# Patient Record
Sex: Female | Born: 1980 | Race: Black or African American | Hispanic: No | Marital: Single | Smoking: Never smoker
Health system: Southern US, Community
[De-identification: ages and names within clinical notes are randomized; demographics above are authoritative.]

## PROBLEM LIST (undated history)

## (undated) DIAGNOSIS — J984 Other disorders of lung: Secondary | ICD-10-CM

## (undated) DIAGNOSIS — J189 Pneumonia, unspecified organism: Secondary | ICD-10-CM

## (undated) DIAGNOSIS — I2699 Other pulmonary embolism without acute cor pulmonale: Secondary | ICD-10-CM

## (undated) DIAGNOSIS — J45909 Unspecified asthma, uncomplicated: Secondary | ICD-10-CM

## (undated) HISTORY — PX: APPENDECTOMY: SHX54

## (undated) HISTORY — PX: BREAST SURGERY: SHX581

---

## 2006-03-10 ENCOUNTER — Emergency Department (HOSPITAL_COMMUNITY): Admission: EM | Admit: 2006-03-10 | Discharge: 2006-03-10 | Payer: Self-pay | Admitting: Family Medicine

## 2006-08-02 ENCOUNTER — Ambulatory Visit (HOSPITAL_COMMUNITY): Admission: RE | Admit: 2006-08-02 | Discharge: 2006-08-02 | Payer: Self-pay | Admitting: Obstetrics & Gynecology

## 2009-10-09 ENCOUNTER — Other Ambulatory Visit
Admission: RE | Admit: 2009-10-09 | Discharge: 2009-10-09 | Payer: Self-pay | Source: Home / Self Care | Admitting: Obstetrics and Gynecology

## 2012-07-03 ENCOUNTER — Encounter (HOSPITAL_COMMUNITY): Payer: Self-pay | Admitting: *Deleted

## 2012-07-03 ENCOUNTER — Emergency Department (HOSPITAL_COMMUNITY): Payer: Managed Care, Other (non HMO)

## 2012-07-03 ENCOUNTER — Emergency Department (HOSPITAL_COMMUNITY)
Admission: EM | Admit: 2012-07-03 | Discharge: 2012-07-03 | Disposition: A | Discharge status: Home / Self Care | Payer: Managed Care, Other (non HMO) | Attending: Emergency Medicine | Admitting: Emergency Medicine

## 2012-07-03 DIAGNOSIS — J45901 Unspecified asthma with (acute) exacerbation: Secondary | ICD-10-CM | POA: Insufficient documentation

## 2012-07-03 DIAGNOSIS — R0982 Postnasal drip: Secondary | ICD-10-CM | POA: Insufficient documentation

## 2012-07-03 DIAGNOSIS — R0602 Shortness of breath: Secondary | ICD-10-CM | POA: Insufficient documentation

## 2012-07-03 HISTORY — DX: Unspecified asthma, uncomplicated: J45.909

## 2012-07-03 MED ORDER — ALBUTEROL SULFATE (5 MG/ML) 0.5% IN NEBU
2.5000 mg | INHALATION_SOLUTION | Freq: Once | RESPIRATORY_TRACT | Status: AC
  Administered 2012-07-03: 2.5 mg via RESPIRATORY_TRACT
  Filled 2012-07-03 (×2): qty 0.5

## 2012-07-03 MED ORDER — ALBUTEROL SULFATE (5 MG/ML) 0.5% IN NEBU
2.5000 mg | INHALATION_SOLUTION | Freq: Once | RESPIRATORY_TRACT | Status: AC
  Administered 2012-07-03: 2.5 mg via RESPIRATORY_TRACT
  Filled 2012-07-03: qty 0.5

## 2012-07-03 MED ORDER — PREDNISONE 20 MG PO TABS
60.0000 mg | ORAL_TABLET | Freq: Once | ORAL | Status: AC
  Administered 2012-07-03: 60 mg via ORAL
  Filled 2012-07-03: qty 3

## 2012-07-03 MED ORDER — PREDNISONE 50 MG PO TABS: 50.0000 mg | ORAL_TABLET | Freq: Every day | ORAL | Status: DC

## 2012-07-03 MED ORDER — IPRATROPIUM BROMIDE 0.02 % IN SOLN
0.5000 mg | Freq: Once | RESPIRATORY_TRACT | Status: AC
  Administered 2012-07-03: 0.5 mg via RESPIRATORY_TRACT
  Filled 2012-07-03: qty 2.5

## 2012-07-03 MED ORDER — ALBUTEROL SULFATE HFA 108 (90 BASE) MCG/ACT IN AERS: 1.0000 | INHALATION_SPRAY | Freq: Four times a day (QID) | RESPIRATORY_TRACT | Status: DC | PRN

## 2012-07-03 NOTE — ED Provider Notes (Addendum)
History     CSN: [id number]  Arrival date & time 07/03/12  1654   First MD Initiated Contact with Patient 07/03/12 1713      Chief Complaint  Patient presents with  . Cough  . Shortness of Breath    (Consider location/radiation/quality/duration/timing/severity/associated sxs/prior treatment) HPI Comments: Pt comes in with cc of dib. Pt has hx of asthma, well managed. States that she went out to a party yday, exposed to some dog, and that likely triggered her asthma. She has used several treatments today, with minimal relief. She has a productive phlegm, yellow, and some teary eyes and post nasal drip. No hx of DVT, PE and no risk factors for the same. No chest pains.  Patient is a 32 y.o. female presenting with cough and shortness of breath. The history is provided by the patient.  Cough Associated symptoms: shortness of breath and wheezing   Associated symptoms: no chest pain and no headaches   Shortness of Breath Associated symptoms: cough and wheezing   Associated symptoms: no abdominal pain, no chest pain, no headaches, no neck pain and no vomiting     Past Medical History  Diagnosis Date  . Asthma     Past Surgical History  Procedure Laterality Date  . Appendectomy    . Breast surgery  breast reduction    No family history on file.  History  Substance Use Topics  . Smoking status: Never Smoker   . Smokeless tobacco: Never Used  . Alcohol Use: No     Comment: occasionally    OB History   Grav Para Term Preterm Abortions TAB SAB Ect Mult Living                  Review of Systems  Constitutional: Negative for activity change.  HENT: Negative for neck pain.   Respiratory: Positive for cough, shortness of breath and wheezing.   Cardiovascular: Negative for chest pain.  Gastrointestinal: Negative for nausea, vomiting and abdominal pain.  Genitourinary: Negative for dysuria.  Neurological: Negative for headaches.    Allergies  Review of patient's  allergies indicates no known allergies.  Home Medications   Current Outpatient Rx  Name  Route  Sig  Dispense  Refill  . albuterol (PROVENTIL HFA;VENTOLIN HFA) 108 (90 BASE) MCG/ACT inhaler   Inhalation   Inhale 2 puffs into the lungs every 6 (six) hours as needed for wheezing.           BP 116/61  Pulse 100  Temp(Src) 99.8 F (37.7 C)  Resp 22  SpO2 98%  LMP 06/25/2012  Physical Exam  Nursing note and vitals reviewed. Constitutional: She is oriented to person, place, and time. She appears well-developed.  HENT:  Head: Normocephalic and atraumatic.  Eyes: Conjunctivae and EOM are normal. Pupils are equal, round, and reactive to light.  Neck: Normal range of motion. Neck supple.  Cardiovascular: Normal rate, regular rhythm and normal heart sounds.   Pulmonary/Chest: Effort normal. No respiratory distress. She has wheezes.  Left worse than right  Abdominal: Soft. Bowel sounds are normal. She exhibits no distension. There is no tenderness. There is no rebound and no guarding.  Neurological: She is alert and oriented to person, place, and time.  Skin: Skin is warm and dry.    ED Course  Procedures (including critical care time)  Labs Reviewed - No data to display No results found.   No diagnosis found.    MDM  Pt comes in with cc of  cough, wheezing and dib. Pt has mild wheezing, right worse than left. We will get breathing tx and CXR. Will reassess.   Derwood Kaplan, MD 07/03/12 1748  7:48 PM Improved exam, feels better. Will d.c  Derwood Kaplan, MD 07/03/12 1948

## 2012-07-03 NOTE — ED Notes (Signed)
Pt states yesterday she went to a friend's house who has a dog.  Pt states her eyes began to water and she developed SOB while at her friend's house.  Pt states this morning she developed a cough productive of green sputum.  End expiratory wheezing heard in right upper lobe.  Pt denies N/V/D and ever.  Pt has hx of asthma and used her albuterol inhaler at home with no relief.

## 2012-07-03 NOTE — ED Notes (Signed)
Patient is alert and oriented x3.  She was given DC instructions and follow up visit instructions.  Patient gave verbal understanding. She was DC ambulatory under her own power to home.  V/S stable.  He was not showing any signs of distress on DC 

## 2012-10-13 ENCOUNTER — Encounter (HOSPITAL_COMMUNITY): Payer: Self-pay | Admitting: Emergency Medicine

## 2012-10-13 ENCOUNTER — Emergency Department (INDEPENDENT_AMBULATORY_CARE_PROVIDER_SITE_OTHER)
Admission: EM | Admit: 2012-10-13 | Discharge: 2012-10-13 | Disposition: A | Discharge status: Home / Self Care | Payer: Self-pay | Source: Home / Self Care

## 2012-10-13 DIAGNOSIS — M67432 Ganglion, left wrist: Secondary | ICD-10-CM

## 2012-10-13 DIAGNOSIS — M6740 Ganglion, unspecified site: Secondary | ICD-10-CM

## 2012-10-13 NOTE — ED Provider Notes (Signed)
Medical screening examination/treatment/procedure(s) were performed by a resident physician or non-physician practitioner and as the supervising physician I was immediately available for consultation/collaboration.  Mat Stuard, MD   Pharaoh Pio S Ivadell Gaul, MD 10/13/12 1714 

## 2012-10-13 NOTE — ED Notes (Addendum)
Pt c/o knot on left wrist onset 3 days and pain onset 1 year Hx of carpal tunnel syndrome... Dx by PCP C/o a constant pain that radiates up to left arm... Numbness associated  Denies: inj/trauma... Taking advil/tyle w/some relief.  Alert w/no signs of acute distress.

## 2012-10-13 NOTE — ED Provider Notes (Signed)
CSN: [id number]     Arrival date & time 10/13/12  1046 History   First MD Initiated Contact with Patient 10/13/12 1150     Chief Complaint  Patient presents with  . Cyst   (Consider location/radiation/quality/duration/timing/severity/associated sxs/prior Treatment) HPI Comments: 32 year old female complaining of pain in the left volar wrist for over 15 years. She notes that there is a cystic lesion in the volar aspect of the wrist that becomes larger then smaller over this period of time. She has mild to moderate discomfort along the flexor surface of the forearm. She states she has seen several doctors but no one has diagnosed her problem.   Past Medical History  Diagnosis Date  . Asthma    Past Surgical History  Procedure Laterality Date  . Appendectomy    . Breast surgery  breast reduction   No family history on file. History  Substance Use Topics  . Smoking status: Never Smoker   . Smokeless tobacco: Never Used  . Alcohol Use: No     Comment: occasionally   OB History   Grav Para Term Preterm Abortions TAB SAB Ect Mult Living                 Review of Systems  All other systems reviewed and are negative.    Allergies  Review of patient's allergies indicates no known allergies.  Home Medications   Current Outpatient Rx  Name  Route  Sig  Dispense  Refill  . albuterol (PROVENTIL HFA;VENTOLIN HFA) 108 (90 BASE) MCG/ACT inhaler   Inhalation   Inhale 2 puffs into the lungs every 6 (six) hours as needed for wheezing.         Marland Kitchen albuterol (PROVENTIL HFA;VENTOLIN HFA) 108 (90 BASE) MCG/ACT inhaler   Inhalation   Inhale 1-2 puffs into the lungs every 6 (six) hours as needed for wheezing.   1 Inhaler   0   . predniSONE (DELTASONE) 50 MG tablet   Oral   Take 1 tablet (50 mg total) by mouth daily.   5 tablet   0    BP 90/60  Pulse 102  Temp(Src) 99.3 F (37.4 C) (Oral)  Resp 16  SpO2 99%  LMP 09/22/2012 Physical Exam  Constitutional: She is oriented to  person, place, and time. She appears well-developed and well-nourished. No distress.  Musculoskeletal:  2 cm diameter cystic lesion to the volar aspect of the left wrist. Mildly tender. No other surrounding lesions. No edema, discoloration or deformity. No evidence of trauma. Distal neurovascular motor sensory is intact.  Neurological: She is alert and oriented to person, place, and time. She exhibits normal muscle tone.  Skin: Skin is warm.  Psychiatric: She has a normal mood and affect.    ED Course  Procedures (including critical care time) Labs Review Labs Reviewed - No data to display Imaging Review No results found.  MDM   1. Ganglion cyst of wrist, left       Patient has a ganglionic cyst which is producing discomfort in the wrist and volar forearm. She is recommended to followup with the on-call hand surgeon as above. She are has interposition a wrist splint.  Hayden Rasmussen, NP 10/13/12 1220

## 2014-07-23 ENCOUNTER — Emergency Department (HOSPITAL_COMMUNITY)
Admission: EM | Admit: 2014-07-23 | Discharge: 2014-07-24 | Disposition: A | Discharge status: Home / Self Care | Payer: Managed Care, Other (non HMO) | Attending: Emergency Medicine | Admitting: Emergency Medicine

## 2014-07-23 ENCOUNTER — Encounter (HOSPITAL_COMMUNITY): Payer: Self-pay

## 2014-07-23 DIAGNOSIS — Z79899 Other long term (current) drug therapy: Secondary | ICD-10-CM | POA: Insufficient documentation

## 2014-07-23 DIAGNOSIS — Z7952 Long term (current) use of systemic steroids: Secondary | ICD-10-CM | POA: Insufficient documentation

## 2014-07-23 DIAGNOSIS — L139 Bullous disorder, unspecified: Secondary | ICD-10-CM | POA: Insufficient documentation

## 2014-07-23 DIAGNOSIS — J45909 Unspecified asthma, uncomplicated: Secondary | ICD-10-CM | POA: Diagnosis not present

## 2014-07-23 DIAGNOSIS — Z4800 Encounter for change or removal of nonsurgical wound dressing: Secondary | ICD-10-CM | POA: Diagnosis present

## 2014-07-23 NOTE — ED Notes (Signed)
Pt presents with c/o wound check. Pt reports she has had a wound on the bottom left of her left foot since approx first of May that is still present. Pt also c/o wound on the right side of her lower right leg.

## 2014-07-23 NOTE — ED Provider Notes (Signed)
CSN: [id number]     Arrival date & time 07/23/14  2157 History  This chart was scribed for non-physician provider Charlann Lange, PA-C, working with Lacretia Leigh, MD by Irene Pap, ED Scribe. This patient was seen in room [place] and patient care was started at 11:16 PM.   Chief Complaint  Patient presents with  . Wound Check   The history is provided by the patient. No language interpreter was used.  HPI Comments: Crystal Gallagher is a 34 y.o. female who presents to the Emergency Department requesting a wound check. She states that she had a rash that spread all over her body that has since relieved slightly. She states that she has a rash area on her right ankle. She states that it started in May, began to relieve and started to come back. She states that it is continuing to spread and is unable to wait for the dermatologist due to itching and pain. She reports having similar wounds on the medial surface and plantar surface of her right foot. She states that it started as a blister and now feels like "raw meat." She states that the pain is localized. She reports using neosporin on the areas to no relief. She denies drainage. She denies history of diabetes or any other significant medical problems or allergies to medication. She denies fever, chills, nausea or vomiting.   Past Medical History  Diagnosis Date  . Asthma    Past Surgical History  Procedure Laterality Date  . Appendectomy    . Breast surgery  breast reduction   No family history on file. History  Substance Use Topics  . Smoking status: Never Smoker   . Smokeless tobacco: Never Used  . Alcohol Use: No     Comment: occasionally   OB History    No data available     Review of Systems  Constitutional: Negative for fever and chills.  Gastrointestinal: Negative for nausea and vomiting.  Skin: Positive for rash and wound.   Allergies  Review of patient's allergies indicates no known allergies.  Home Medications    Prior to Admission medications   Medication Sig Start Date End Date Taking? Authorizing Provider  albuterol (PROVENTIL HFA;VENTOLIN HFA) 108 (90 BASE) MCG/ACT inhaler Inhale 2 puffs into the lungs every 6 (six) hours as needed for wheezing.    Historical Provider, MD  albuterol (PROVENTIL HFA;VENTOLIN HFA) 108 (90 BASE) MCG/ACT inhaler Inhale 1-2 puffs into the lungs every 6 (six) hours as needed for wheezing. 07/03/12   Varney Biles, MD  predniSONE (DELTASONE) 50 MG tablet Take 1 tablet (50 mg total) by mouth daily. 07/03/12   Ankit Nanavati, MD   BP 130/68 mmHg  Pulse 106  Temp(Src) 98.6 F (37 C) (Oral)  Resp 16  SpO2 98%  LMP 06/29/2014 (Approximate)   Physical Exam  Constitutional: She is oriented to person, place, and time. She appears well-developed and well-nourished. No distress.  HENT:  Head: Normocephalic and atraumatic.  Mouth/Throat: Oropharynx is clear and moist.  Eyes: Conjunctivae and EOM are normal.  Neck: Normal range of motion. Neck supple.  Cardiovascular: Normal rate, regular rhythm and normal heart sounds.   Pulmonary/Chest: Effort normal and breath sounds normal. No respiratory distress.  Musculoskeletal: Normal range of motion. She exhibits no edema.  Neurological: She is alert and oriented to person, place, and time. No sensory deficit.  Skin: Skin is warm and dry.  Large circular ulcerations bilateral ankle and feet; hyperpigmentation; no malodor, no drainage  Psychiatric:  She has a normal mood and affect. Her behavior is normal.  Nursing note and vitals reviewed.   ED Course  Procedures (including critical care time) DIAGNOSTIC STUDIES: Oxygen Saturation is 98% on room air, normal by my interpretation.    COORDINATION OF CARE: 11:22 PM-Discussed treatment plan which includes discussion with attending provider with pt at bedside and pt agreed to plan.   Labs Review Labs Reviewed - No data to display  Imaging Review No results found.   EKG  Interpretation None      MDM   Final diagnoses:  None    1. Bullous dermatitis  DDx: psoriasis vs bullous pemphigoid vs bullous impetigo.Patient is well appearing. No fever. Symptoms longstanding, though worsening. Doubt impetigo. Biopsy will be needed to definitively diagnose - will refer to dermatology. Oral steroids provided in taper dose.  I personally performed the services described in this documentation, which was scribed in my presence. The recorded information has been reviewed and is accurate.     Charlann Lange, PA-C 07/24/14 0448  Linton Flemings, MD 07/24/14 [phone]

## 2014-07-24 MED ORDER — PREDNISONE 10 MG PO TABS: ORAL_TABLET | ORAL | Status: DC

## 2014-07-24 NOTE — Discharge Instructions (Signed)
FOLLOW UP WITH DERMATOLOGY FOR FURTHER EVALUATION AND MANAGEMENT OF PROGRESSIVE RASH.

## 2014-08-14 ENCOUNTER — Other Ambulatory Visit (HOSPITAL_COMMUNITY)
Admission: RE | Admit: 2014-08-14 | Discharge: 2014-08-14 | Disposition: A | Discharge status: Home / Self Care | Payer: Managed Care, Other (non HMO) | Source: Ambulatory Visit | Attending: Obstetrics and Gynecology | Admitting: Obstetrics and Gynecology

## 2014-08-14 ENCOUNTER — Other Ambulatory Visit: Payer: Self-pay | Admitting: Obstetrics and Gynecology

## 2014-08-14 DIAGNOSIS — R87810 Cervical high risk human papillomavirus (HPV) DNA test positive: Secondary | ICD-10-CM | POA: Diagnosis present

## 2014-08-14 DIAGNOSIS — Z113 Encounter for screening for infections with a predominantly sexual mode of transmission: Secondary | ICD-10-CM | POA: Diagnosis present

## 2014-08-14 DIAGNOSIS — Z01419 Encounter for gynecological examination (general) (routine) without abnormal findings: Secondary | ICD-10-CM | POA: Diagnosis not present

## 2014-08-14 DIAGNOSIS — Z1151 Encounter for screening for human papillomavirus (HPV): Secondary | ICD-10-CM | POA: Diagnosis present

## 2014-08-15 LAB — CYTOLOGY - PAP

## 2016-05-04 ENCOUNTER — Other Ambulatory Visit: Payer: Self-pay | Admitting: Surgical Oncology

## 2016-05-04 DIAGNOSIS — J4520 Mild intermittent asthma, uncomplicated: Secondary | ICD-10-CM

## 2016-10-21 ENCOUNTER — Emergency Department (HOSPITAL_COMMUNITY)
Admission: EM | Admit: 2016-10-21 | Discharge: 2016-10-21 | Disposition: A | Discharge status: Home / Self Care | Payer: 59 | Attending: Emergency Medicine | Admitting: Emergency Medicine

## 2016-10-21 ENCOUNTER — Encounter (HOSPITAL_COMMUNITY): Payer: Self-pay | Admitting: Emergency Medicine

## 2016-10-21 DIAGNOSIS — R05 Cough: Secondary | ICD-10-CM | POA: Diagnosis present

## 2016-10-21 DIAGNOSIS — R0602 Shortness of breath: Secondary | ICD-10-CM | POA: Diagnosis not present

## 2016-10-21 DIAGNOSIS — Z5321 Procedure and treatment not carried out due to patient leaving prior to being seen by health care provider: Secondary | ICD-10-CM | POA: Diagnosis not present

## 2016-10-21 NOTE — ED Triage Notes (Signed)
Pt states for the past few days she has had a cough and some shortness of breath on exertion  Pt has hx of asthma and does not have an inhaler

## 2016-10-21 NOTE — ED Notes (Signed)
Pt told registration clerk she was leaving

## 2016-10-30 ENCOUNTER — Encounter (HOSPITAL_BASED_OUTPATIENT_CLINIC_OR_DEPARTMENT_OTHER): Payer: Self-pay

## 2016-10-30 ENCOUNTER — Emergency Department (HOSPITAL_BASED_OUTPATIENT_CLINIC_OR_DEPARTMENT_OTHER): Payer: 59

## 2016-10-30 ENCOUNTER — Emergency Department (HOSPITAL_BASED_OUTPATIENT_CLINIC_OR_DEPARTMENT_OTHER)
Admission: EM | Admit: 2016-10-30 | Discharge: 2016-10-30 | Disposition: A | Discharge status: Home / Self Care | Payer: 59 | Attending: Emergency Medicine | Admitting: Emergency Medicine

## 2016-10-30 DIAGNOSIS — Z79899 Other long term (current) drug therapy: Secondary | ICD-10-CM | POA: Diagnosis not present

## 2016-10-30 DIAGNOSIS — R062 Wheezing: Secondary | ICD-10-CM | POA: Diagnosis present

## 2016-10-30 DIAGNOSIS — J45909 Unspecified asthma, uncomplicated: Secondary | ICD-10-CM | POA: Diagnosis not present

## 2016-10-30 MED ORDER — ALBUTEROL SULFATE HFA 108 (90 BASE) MCG/ACT IN AERS: 1.0000 | INHALATION_SPRAY | Freq: Four times a day (QID) | RESPIRATORY_TRACT | 0 refills | Status: AC | PRN

## 2016-10-30 MED ORDER — BENZONATATE 100 MG PO CAPS: 100.0000 mg | ORAL_CAPSULE | Freq: Three times a day (TID) | ORAL | 0 refills | Status: DC
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
## 2016-11-10 ENCOUNTER — Emergency Department (HOSPITAL_COMMUNITY): Payer: 59

## 2016-11-10 ENCOUNTER — Encounter (HOSPITAL_COMMUNITY): Payer: Self-pay | Admitting: Emergency Medicine

## 2016-11-10 ENCOUNTER — Emergency Department (HOSPITAL_COMMUNITY)
Admission: EM | Admit: 2016-11-10 | Discharge: 2016-11-10 | Disposition: A | Discharge status: Home / Self Care | Payer: 59 | Attending: Emergency Medicine | Admitting: Emergency Medicine

## 2016-11-10 DIAGNOSIS — R05 Cough: Secondary | ICD-10-CM | POA: Diagnosis present

## 2016-11-10 DIAGNOSIS — J189 Pneumonia, unspecified organism: Secondary | ICD-10-CM

## 2016-11-10 DIAGNOSIS — J181 Lobar pneumonia, unspecified organism: Secondary | ICD-10-CM | POA: Diagnosis not present

## 2016-11-10 DIAGNOSIS — J45909 Unspecified asthma, uncomplicated: Secondary | ICD-10-CM | POA: Diagnosis not present

## 2016-11-10 LAB — CBC WITH DIFFERENTIAL/PLATELET
Basophils Absolute: 0 10*3/uL (ref 0.0–0.1)
Basophils Relative: 1 %
EOS PCT: 8 %
Eosinophils Absolute: 0.6 10*3/uL (ref 0.0–0.7)
HEMATOCRIT: 41.3 % (ref 36.0–46.0)
Hemoglobin: 13.4 g/dL (ref 12.0–15.0)
LYMPHS ABS: 1.6 10*3/uL (ref 0.7–4.0)
LYMPHS PCT: 21 %
MCH: 28.5 pg (ref 26.0–34.0)
MCHC: 32.4 g/dL (ref 30.0–36.0)
MCV: 87.7 fL (ref 78.0–100.0)
MONO ABS: 0.6 10*3/uL (ref 0.1–1.0)
MONOS PCT: 8 %
NEUTROS ABS: 4.6 10*3/uL (ref 1.7–7.7)
Neutrophils Relative %: 62 %
PLATELETS: 368 10*3/uL (ref 150–400)
RBC: 4.71 MIL/uL (ref 3.87–5.11)
RDW: 14 % (ref 11.5–15.5)
WBC: 7.4 10*3/uL (ref 4.0–10.5)

## 2016-11-10 LAB — COMPREHENSIVE METABOLIC PANEL
ALT: 32 U/L (ref 14–54)
ANION GAP: 9 (ref 5–15)
AST: 27 U/L (ref 15–41)
Albumin: 3.7 g/dL (ref 3.5–5.0)
Alkaline Phosphatase: 121 U/L (ref 38–126)
BILIRUBIN TOTAL: 0.3 mg/dL (ref 0.3–1.2)
BUN: 11 mg/dL (ref 6–20)
CHLORIDE: 105 mmol/L (ref 101–111)
CO2: 22 mmol/L (ref 22–32)
Calcium: 9.3 mg/dL (ref 8.9–10.3)
Creatinine, Ser: 0.66 mg/dL (ref 0.44–1.00)
Glucose, Bld: 78 mg/dL (ref 65–99)
POTASSIUM: 4 mmol/L (ref 3.5–5.1)
Sodium: 136 mmol/L (ref 135–145)
TOTAL PROTEIN: 7.2 g/dL (ref 6.5–8.1)

## 2016-11-10 LAB — I-STAT TROPONIN, ED: TROPONIN I, POC: 0 ng/mL (ref 0.00–0.08)

## 2016-11-10 MED ORDER — ALBUTEROL SULFATE (5 MG/ML) 0.5% IN NEBU: 2.5000 mg | INHALATION_SOLUTION | Freq: Four times a day (QID) | RESPIRATORY_TRACT | 12 refills | Status: DC | PRN

## 2016-11-10 MED ORDER — IPRATROPIUM-ALBUTEROL 0.5-2.5 (3) MG/3ML IN SOLN
3.0000 mL | Freq: Once | RESPIRATORY_TRACT | Status: AC
  Administered 2016-11-10: 3 mL via RESPIRATORY_TRACT
  Filled 2016-11-10: qty 3

## 2016-11-10 MED ORDER — AZITHROMYCIN 250 MG PO TABS
250.0000 mg | ORAL_TABLET | Freq: Every day | ORAL | 0 refills | Status: DC
Start: 2016-11-10 — End: 2016-12-05
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
## 2016-11-10 NOTE — Discharge Instructions (Signed)
Please read attached information. If you experience any new or worsening signs or symptoms please return to the emergency room for evaluation. Please follow-up with your primary care provider or specialist as discussed. Please use medication prescribed only as directed and discontinue taking if you have any concerning signs or symptoms.   °

## 2016-11-10 NOTE — ED Triage Notes (Signed)
Pt also c/o chest pain.

## 2016-11-10 NOTE — ED Notes (Signed)
Pt states she feels better after the breathing tx, pt states it does not take as much effort to talk.

## 2016-11-10 NOTE — ED Triage Notes (Signed)
Pt c/o 3 weeks of cough.  St's when she coughs she becomes short of breath.  Was seen at [facility] and given med for cough but hasn't helped any.  Pt also c/o lower  Back pain onset 1 week ago.  St's painful to walk or sit down.  Pt speaking in full sentences
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
## 2016-11-30 ENCOUNTER — Emergency Department (HOSPITAL_COMMUNITY): Payer: 59

## 2016-11-30 ENCOUNTER — Encounter (HOSPITAL_COMMUNITY): Payer: Self-pay | Admitting: Emergency Medicine

## 2016-11-30 DIAGNOSIS — R05 Cough: Secondary | ICD-10-CM | POA: Diagnosis present

## 2016-11-30 DIAGNOSIS — R0602 Shortness of breath: Secondary | ICD-10-CM | POA: Diagnosis not present

## 2016-11-30 DIAGNOSIS — Z5321 Procedure and treatment not carried out due to patient leaving prior to being seen by health care provider: Secondary | ICD-10-CM | POA: Diagnosis not present

## 2016-11-30 LAB — BASIC METABOLIC PANEL
Anion gap: 9 (ref 5–15)
BUN: 15 mg/dL (ref 6–20)
CHLORIDE: 104 mmol/L (ref 101–111)
CO2: 24 mmol/L (ref 22–32)
CREATININE: 0.76 mg/dL (ref 0.44–1.00)
Calcium: 9.3 mg/dL (ref 8.9–10.3)
GFR calc Af Amer: >60 mL/min (ref >60)
GFR calc non Af Amer: >60 mL/min (ref >60)
Glucose, Bld: 95 mg/dL (ref 65–99)
Potassium: 3.9 mmol/L (ref 3.5–5.1)
SODIUM: 137 mmol/L (ref 135–145)

## 2016-11-30 LAB — I-STAT TROPONIN, ED: Troponin i, poc: 0.01 ng/mL (ref 0.00–0.08)

## 2016-11-30 LAB — CBC
HCT: 40.1 % (ref 36.0–46.0)
Hemoglobin: 13 g/dL (ref 12.0–15.0)
MCH: 28.4 pg (ref 26.0–34.0)
MCHC: 32.4 g/dL (ref 30.0–36.0)
MCV: 87.6 fL (ref 78.0–100.0)
PLATELETS: 355 10*3/uL (ref 150–400)
RBC: 4.58 MIL/uL (ref 3.87–5.11)
RDW: 13.5 % (ref 11.5–15.5)
WBC: 9 10*3/uL (ref 4.0–10.5)

## 2016-11-30 NOTE — ED Triage Notes (Signed)
Pt c/o cough and shortness of breath. Recent pneumonia diagnosis, states she has finished her antibiotics with no change.

## 2016-12-01 ENCOUNTER — Emergency Department (HOSPITAL_COMMUNITY)
Admission: EM | Admit: 2016-12-01 | Discharge: 2016-12-01 | Disposition: A | Discharge status: Home / Self Care | Payer: 59 | Attending: Emergency Medicine | Admitting: Emergency Medicine

## 2016-12-01 HISTORY — DX: Pneumonia, unspecified organism: J18.9

## 2016-12-01 NOTE — ED Notes (Signed)
The pt rep[orts that she cannot stay any longer  She will return tomorrow

## 2016-12-05 ENCOUNTER — Emergency Department (HOSPITAL_BASED_OUTPATIENT_CLINIC_OR_DEPARTMENT_OTHER)
Admit: 2016-12-05 | Discharge: 2016-12-05 | Disposition: A | Discharge status: Home / Self Care | Payer: 59 | Attending: Student | Admitting: Student

## 2016-12-05 ENCOUNTER — Other Ambulatory Visit: Payer: Self-pay

## 2016-12-05 ENCOUNTER — Encounter (HOSPITAL_COMMUNITY): Payer: Self-pay | Admitting: Emergency Medicine

## 2016-12-05 ENCOUNTER — Observation Stay (HOSPITAL_COMMUNITY)
Admission: EM | Admit: 2016-12-05 | Discharge: 2016-12-06 | Disposition: A | Discharge status: Home / Self Care | Payer: 59 | Attending: Family Medicine | Admitting: Family Medicine

## 2016-12-05 ENCOUNTER — Emergency Department (HOSPITAL_COMMUNITY): Payer: 59

## 2016-12-05 DIAGNOSIS — J45909 Unspecified asthma, uncomplicated: Secondary | ICD-10-CM | POA: Diagnosis not present

## 2016-12-05 DIAGNOSIS — Z6841 Body Mass Index (BMI) 40.0 and over, adult: Secondary | ICD-10-CM | POA: Insufficient documentation

## 2016-12-05 DIAGNOSIS — K449 Diaphragmatic hernia without obstruction or gangrene: Secondary | ICD-10-CM | POA: Diagnosis not present

## 2016-12-05 DIAGNOSIS — J040 Acute laryngitis: Secondary | ICD-10-CM | POA: Diagnosis not present

## 2016-12-05 DIAGNOSIS — J9601 Acute respiratory failure with hypoxia: Secondary | ICD-10-CM | POA: Diagnosis not present

## 2016-12-05 DIAGNOSIS — J181 Lobar pneumonia, unspecified organism: Secondary | ICD-10-CM | POA: Diagnosis present

## 2016-12-05 DIAGNOSIS — R0781 Pleurodynia: Secondary | ICD-10-CM | POA: Diagnosis present

## 2016-12-05 DIAGNOSIS — E860 Dehydration: Secondary | ICD-10-CM | POA: Diagnosis not present

## 2016-12-05 DIAGNOSIS — J189 Pneumonia, unspecified organism: Secondary | ICD-10-CM | POA: Diagnosis not present

## 2016-12-05 DIAGNOSIS — R590 Localized enlarged lymph nodes: Secondary | ICD-10-CM | POA: Diagnosis not present

## 2016-12-05 DIAGNOSIS — Z79899 Other long term (current) drug therapy: Secondary | ICD-10-CM | POA: Insufficient documentation

## 2016-12-05 DIAGNOSIS — R071 Chest pain on breathing: Secondary | ICD-10-CM | POA: Insufficient documentation

## 2016-12-05 DIAGNOSIS — M79609 Pain in unspecified limb: Secondary | ICD-10-CM | POA: Diagnosis not present

## 2016-12-05 DIAGNOSIS — M7989 Other specified soft tissue disorders: Secondary | ICD-10-CM | POA: Diagnosis not present

## 2016-12-05 DIAGNOSIS — M79661 Pain in right lower leg: Secondary | ICD-10-CM | POA: Insufficient documentation

## 2016-12-05 DIAGNOSIS — R2241 Localized swelling, mass and lump, right lower limb: Secondary | ICD-10-CM | POA: Insufficient documentation

## 2016-12-05 LAB — BASIC METABOLIC PANEL
ANION GAP: 10 (ref 5–15)
BUN: 9 mg/dL (ref 6–20)
CHLORIDE: 106 mmol/L (ref 101–111)
CO2: 21 mmol/L — ABNORMAL LOW (ref 22–32)
Calcium: 8.9 mg/dL (ref 8.9–10.3)
Creatinine, Ser: 0.83 mg/dL (ref 0.44–1.00)
GFR calc Af Amer: >60 mL/min (ref >60)
Glucose, Bld: 124 mg/dL — ABNORMAL HIGH (ref 65–99)
POTASSIUM: 3.7 mmol/L (ref 3.5–5.1)
SODIUM: 137 mmol/L (ref 135–145)

## 2016-12-05 LAB — INFLUENZA PANEL BY PCR (TYPE A & B)
INFLBPCR: NEGATIVE
Influenza A By PCR: NEGATIVE

## 2016-12-05 LAB — CBC WITH DIFFERENTIAL/PLATELET
BASOS ABS: 0 10*3/uL (ref 0.0–0.1)
BASOS PCT: 0 %
EOS PCT: 10 %
Eosinophils Absolute: 0.7 10*3/uL (ref 0.0–0.7)
HCT: 40.7 % (ref 36.0–46.0)
Hemoglobin: 13 g/dL (ref 12.0–15.0)
LYMPHS PCT: 19 %
Lymphs Abs: 1.3 10*3/uL (ref 0.7–4.0)
MCH: 28.3 pg (ref 26.0–34.0)
MCHC: 31.9 g/dL (ref 30.0–36.0)
MCV: 88.5 fL (ref 78.0–100.0)
Monocytes Absolute: 0.4 10*3/uL (ref 0.1–1.0)
Monocytes Relative: 6 %
Neutro Abs: 4.7 10*3/uL (ref 1.7–7.7)
Neutrophils Relative %: 65 %
PLATELETS: 333 10*3/uL (ref 150–400)
RBC: 4.6 MIL/uL (ref 3.87–5.11)
RDW: 13.7 % (ref 11.5–15.5)
WBC: 7.2 10*3/uL (ref 4.0–10.5)

## 2016-12-05 LAB — I-STAT TROPONIN, ED: TROPONIN I, POC: 0 ng/mL (ref 0.00–0.08)

## 2016-12-05 LAB — PROCALCITONIN

## 2016-12-05 LAB — BRAIN NATRIURETIC PEPTIDE: B NATRIURETIC PEPTIDE 5: 17.4 pg/mL (ref 0.0–100.0)

## 2016-12-05 LAB — POC URINE PREG, ED: PREG TEST UR: NEGATIVE

## 2016-12-05 LAB — SEDIMENTATION RATE: Sed Rate: 33 mm/hr — ABNORMAL HIGH (ref 0–22)

## 2016-12-05 MED ORDER — BENZONATATE 100 MG PO CAPS
100.0000 mg | ORAL_CAPSULE | Freq: Once | ORAL | Status: AC
  Administered 2016-12-05: 100 mg via ORAL
  Filled 2016-12-05: qty 1

## 2016-12-05 MED ORDER — IPRATROPIUM-ALBUTEROL 0.5-2.5 (3) MG/3ML IN SOLN
3.0000 mL | Freq: Once | RESPIRATORY_TRACT | Status: AC
  Administered 2016-12-05: 3 mL via RESPIRATORY_TRACT
  Filled 2016-12-05: qty 3

## 2016-12-05 MED ORDER — LEVOFLOXACIN 750 MG PO TABS: 750.0000 mg | ORAL_TABLET | Freq: Once | ORAL | Status: DC

## 2016-12-05 MED ORDER — DEXTROSE 5 % IV SOLN: 1.0000 g | Freq: Once | INTRAVENOUS | Status: DC

## 2016-12-05 MED ORDER — PANTOPRAZOLE SODIUM 40 MG PO TBEC
40.0000 mg | DELAYED_RELEASE_TABLET | Freq: Every day | ORAL | Status: DC
  Administered 2016-12-05: 40 mg via ORAL
  Filled 2016-12-05: qty 1

## 2016-12-05 MED ORDER — ENOXAPARIN SODIUM 40 MG/0.4ML ~~LOC~~ SOLN
40.0000 mg | SUBCUTANEOUS | Status: DC
  Administered 2016-12-05: 40 mg via SUBCUTANEOUS
  Filled 2016-12-05 (×2): qty 0.4

## 2016-12-05 MED ORDER — IPRATROPIUM-ALBUTEROL 0.5-2.5 (3) MG/3ML IN SOLN
3.0000 mL | Freq: Four times a day (QID) | RESPIRATORY_TRACT | Status: DC
  Administered 2016-12-05: 3 mL via RESPIRATORY_TRACT
  Filled 2016-12-05: qty 3

## 2016-12-05 MED ORDER — IPRATROPIUM-ALBUTEROL 0.5-2.5 (3) MG/3ML IN SOLN: 3.0000 mL | Freq: Three times a day (TID) | RESPIRATORY_TRACT | Status: DC

## 2016-12-05 MED ORDER — LEVOFLOXACIN IN D5W 750 MG/150ML IV SOLN
750.0000 mg | INTRAVENOUS | Status: DC
  Administered 2016-12-05: 750 mg via INTRAVENOUS
  Filled 2016-12-05 (×3): qty 150

## 2016-12-05 MED ORDER — IOPAMIDOL (ISOVUE-370) INJECTION 76%
100.0000 mL | Freq: Once | INTRAVENOUS | Status: AC | PRN
  Administered 2016-12-05: 100 mL via INTRAVENOUS

## 2016-12-05 MED ORDER — IOPAMIDOL (ISOVUE-370) INJECTION 76%
INTRAVENOUS | Status: AC
  Filled 2016-12-05: qty 100

## 2016-12-05 MED ORDER — SODIUM CHLORIDE 0.9 % IV SOLN
INTRAVENOUS | Status: DC
  Administered 2016-12-05: 17:00:00 via INTRAVENOUS
  Administered 2016-12-06: 75 mL/h via INTRAVENOUS

## 2016-12-05 MED ORDER — HYDROCODONE-HOMATROPINE 5-1.5 MG/5ML PO SYRP: 5.0000 mL | ORAL_SOLUTION | ORAL | Status: DC | PRN

## 2016-12-05 MED ORDER — KETOROLAC TROMETHAMINE 15 MG/ML IJ SOLN
15.0000 mg | Freq: Four times a day (QID) | INTRAMUSCULAR | Status: DC
  Administered 2016-12-05 – 2016-12-06 (×2): 15 mg via INTRAVENOUS
  Filled 2016-12-05 (×3): qty 1

## 2016-12-05 NOTE — ED Notes (Signed)
MD in room.  Patient advised to get a repeat CAT scan in 2 to 3 months to reevaluate nodules found on CT today.

## 2016-12-05 NOTE — H&P (Signed)
History and Physical    Crystal Gallagher HTD:[id number] DOB: 1980-09-23 DOA: 12/05/2016   PCP: Cari Caraway, MD   Attending physician: Denton Brick  Patient coming from/Resides with: Private residence  Chief Complaint: Persistent pneumonia symptoms with shortness of breath and pleuritic chest pain  HPI: Crystal Gallagher is a 36 y.o. female with medical history significant for asthma, morbid obesity who was recently treated for left lower lobe pneumonia with a combination of Zithromax and steroids.  Patient reports no significant improvement in symptoms after completion of above-stated therapies.  She did notice her sputum became more clear after completion of antibiotics but over the past several days has changed from occasionally blood-tinged to brown in color.  She has now developed some laryngitis.  She has had recurrence of severe wheezing, generalized malaise and noted pleuritic chest discomfort with breathing and coughing.  She also notices profound dyspnea on exertion with ambulation.  No orthopnea.  No lower extremity edema.  CT of the chest performed in the ER today reveals left lower lobe pneumonia with central air bronchograms as well as left hilar/mediastinal lymphadenopathy likely reactive in etiology.  Initial plans in the ER were to change patient's antibiotics and send her home.  Unfortunately with ambulation on room air patient became quite tachypneic with respiratory rate in the 40s and O2 sats dropping to 87%.  ED Course:  Vital Signs: BP 118/65 (BP Location: Right Arm)   Pulse 75   Temp 98.8 F (37.1 C) (Oral)   Resp 18   Ht 5' 6" (1.676 m)   Wt 136.1 kg (300 lb)   SpO2 97%   BMI 48.42 kg/m  CT of the chest: As above-pulmonary embolism Lab data: Sodium 137, potassium 3.7, chloride 106, CO2 21, glucose 124, BUN 9, creatinine 0.83, anion gap 10, poc troponin 0 0.00, white count 7200 with normal differential, hemoglobin 13, platelets 333,000, pregnancy negative Medications and  treatments: Tessalon 100 mg x1, DuoNeb x1  Review of Systems:  In addition to the HPI above,  No Fever-chills, myalgias or other constitutional symptoms No Headache, changes with Vision or hearing, new weakness, tingling, numbness in any extremity, dizziness, dysarthria or word finding difficulty, gait disturbance or imbalance, tremors or seizure activity No problems swallowing food or Liquids, indigestion/reflux, choking or coughing while eating, abdominal pain with or after eating No Cough, palpitations, orthopnea  No Abdominal pain, N/V, melena,hematochezia, dark tarry stools, constipation No dysuria, malodorous urine, hematuria or flank pain No new skin rashes, lesions, masses or bruises, No new joint pains, aches, swelling or redness No recent unintentional weight gain or loss No polyuria, polydypsia or polyphagia   Past Medical History:  Diagnosis Date  . Asthma   . Pneumonia     Past Surgical History:  Procedure Laterality Date  . APPENDECTOMY    . BREAST SURGERY  breast reduction    Social History   Social History  . Marital status: Single    Spouse name: N/A  . Number of children: N/A  . Years of education: N/A   Occupational History  . Not on file.   Social History Main Topics  . Smoking status: Never Smoker  . Smokeless tobacco: Never Used  . Alcohol use No     Comment: occasionally  . Drug use: No  . Sexual activity: Not on file   Other Topics Concern  . Not on file   Social History Narrative  . No narrative on file    Mobility: Independent Work history: Office  worker with [employer]    Family History  Problem Relation Age of Onset  . Cancer Other   . Hypertension Other      Prior to Admission medications   Medication Sig Start Date End Date Taking? Authorizing Provider  albuterol (PROVENTIL HFA;VENTOLIN HFA) 108 (90 Base) MCG/ACT inhaler Inhale 1-2 puffs into the lungs every 6 (six) hours as needed for wheezing. 10/30/16  Yes Tanna Furry, MD  albuterol (PROVENTIL) (5 MG/ML) 0.5% nebulizer solution Take 0.5 mLs (2.5 mg total) by nebulization every 6 (six) hours as needed for wheezing or shortness of breath. 11/10/16  Yes Hedges, Dellis Filbert, PA-C  diphenhydrAMINE (BENADRYL) 25 MG tablet Take 50 mg by mouth at bedtime as needed for sleep.   Yes [provider]  levonorgestrel (MIRENA) 20 MCG/24HR IUD 1 each by Intrauterine route once.   Yes [provider]  benzonatate (TESSALON) 100 MG capsule Take 1 capsule (100 mg total) by mouth every 8 (eight) hours. Patient not taking: Reported on 12/05/2016 10/30/16   Tanna Furry, MD    Physical Exam: Vitals:   12/05/16 0928 12/05/16 0933 12/05/16 1413  BP: 123/80  118/65  Pulse: (!) 109  75  Resp: 18  18  Temp: 98.8 F (37.1 C)    TempSrc: Oral    SpO2: 93%  97%  Weight:  136.1 kg (300 lb)   Height:  5' 6" (1.676 m)       Constitutional: NAD, calm, comfortable Eyes: PERRL, lids and conjunctivae normal ENMT: Mucous membranes are moist. Posterior pharynx clear of any exudate or lesions. Normal dentition.  Neck: normal, supple, no masses, no thyromegaly Respiratory: coarse to auscultation bilaterally with good air movement and scattered expiratory rhonchi. Normal respiratory effort without accessory muscle use rest.  Documented increased work of breathing and tachypnea with ambulation on room air with subsequent decrease in O2 saturations to 87%. Cardiovascular: Regular rate and rhythm, no murmurs / rubs / gallops. No extremity edema. 2+ pedal pulses. No carotid bruits.  Abdomen: no tenderness, no masses palpated. No hepatosplenomegaly. Bowel sounds positive.  Musculoskeletal: no clubbing / cyanosis. No joint deformity upper and lower extremities. Good ROM, no contractures. Normal muscle tone.  Skin: no rashes, lesions, ulcers. No induration Neurologic: CN 2-12 grossly intact. Sensation intact, DTR normal. Strength 5/5 x all 4 extremities.  Psychiatric: Normal  judgment and insight. Alert and oriented x 3. Normal mood.    Labs on Admission: I have personally reviewed following labs and imaging studies  CBC:  Recent Labs Lab 11/30/16 2125 12/05/16 1046  WBC 9.0 7.2  NEUTROABS  --  4.7  HGB 13.0 13.0  HCT 40.1 40.7  MCV 87.6 88.5  PLT 355 914   Basic Metabolic Panel:  Recent Labs Lab 11/30/16 2125 12/05/16 1046  NA 137 137  K 3.9 3.7  CL 104 106  CO2 24 21*  GLUCOSE 95 124*  BUN 15 9  CREATININE 0.76 0.83  CALCIUM 9.3 8.9   GFR: Estimated Creatinine Clearance: 130.6 mL/min (by C-G formula based on SCr of 0.83 mg/dL). Liver Function Tests: No results for input(s): AST, ALT, ALKPHOS, BILITOT, PROT, ALBUMIN in the last 168 hours. No results for input(s): LIPASE, AMYLASE in the last 168 hours. No results for input(s): AMMONIA in the last 168 hours. Coagulation Profile: No results for input(s): INR, PROTIME in the last 168 hours. Cardiac Enzymes: No results for input(s): CKTOTAL, CKMB, CKMBINDEX, TROPONINI in the last 168 hours. BNP (last 3 results) No results  for input(s): PROBNP in the last 8760 hours. HbA1C: No results for input(s): HGBA1C in the last 72 hours. CBG: No results for input(s): GLUCAP in the last 168 hours. Lipid Profile: No results for input(s): CHOL, HDL, LDLCALC, TRIG, CHOLHDL, LDLDIRECT in the last 72 hours. Thyroid Function Tests: No results for input(s): TSH, T4TOTAL, FREET4, T3FREE, THYROIDAB in the last 72 hours. Anemia Panel: No results for input(s): VITAMINB12, FOLATE, FERRITIN, TIBC, IRON, RETICCTPCT in the last 72 hours. Urine analysis: No results found for: COLORURINE, APPEARANCEUR, LABSPEC, PHURINE, GLUCOSEU, HGBUR, BILIRUBINUR, KETONESUR, PROTEINUR, UROBILINOGEN, NITRITE, LEUKOCYTESUR Sepsis Labs: _0 (procalcitonin:4,lacticidven:4) )No results found for this or any previous visit (from the past 240 hour(s)).   Radiological Exams on Admission: Ct Angio Chest Pe W/cm &/or Wo  Cm  Result Date: 12/05/2016 CLINICAL DATA:  Chest pain, shortness of breath, and cough for approximately 1 month. PE suspected, high pretest prob EXAM: CT ANGIOGRAPHY CHEST WITH CONTRAST TECHNIQUE: Multidetector CT imaging of the chest was performed using the standard protocol during bolus administration of intravenous contrast. Multiplanar CT image reconstructions and MIPs were obtained to evaluate the vascular anatomy. CONTRAST:  100 mL Isovue 370 COMPARISON:  Chest radiograph on 11/30/2016 FINDINGS: Cardiovascular: Technically suboptimal exam due to large patient habitus and suboptimal opacification of pulmonary arteries. Evaluation of left lower lobe pulmonary arteries is also limited by pulmonary consolidation. Allowing for these limitations, no central pulmonary emboli identified. No evidence of thoracic aortic dissection or aneurysm. Mediastinum/Nodes: Mild mediastinal adenopathy is seen in the lateral aortic and subcarinal regions. Left hilar lymphadenopathy is also seen. No evidence of right hilar or axillary lymphadenopathy. Lungs/Pleura: Left lower lobe airspace disease is seen with central air bronchograms. This is suspicious for pneumonia. Central tracheobronchial airways appear patent. No evidence of pleural effusion. No evidence of right lung infiltrate, however several right lung nodules or identified, largest measuring 4 mm on image 66/6. Upper abdomen: Small hiatal hernia. Musculoskeletal: No suspicious bone lesions identified. Review of the MIP images confirms the above findings. IMPRESSION: Technically suboptimal exam as discussed above. No central pulmonary emboli identified. Left lower lobe airspace disease with central air bronchograms, highly suspicious for pneumonia. Left hilar and mediastinal lymphadenopathy, which may be reactive in etiology although neoplasm cannot definitely be excluded. Recommend continued post treatment chest CT follow-up in 2-3 months. Indeterminate right lung  nodules measuring up to 4 mm. Recommend continued attention on follow-up CT. Small hiatal hernia. Electronically Signed   By: Earle Gell M.D.   On: 12/05/2016 15:34    EKG: (Independently reviewed) sinus tachycardia with ventricular rate 109 bpm, QTC 452 ms, normal R wave rotation, no ischemic changes  Assessment/Plan Principal Problem:   Acute respiratory failure with hypoxia 2/2 CAP (community acquired pneumonia) -Patient presents with unresolved pneumonia symptoms after failed outpatient treatment with Zithromax/steroids -CT of chest with evidence of persistent left lower lobe pneumonia as well as mediastinal and hilar adenopathy-radiologist recommends repeat CT in 3 months to rule out underlying malignancy-patient is non-smoker -Ambulatory hypoxemia-repeat pulse oximetry with ambulation both on room air and oxygen-may need short-term oxygen at discharge -IV Levaquin -Blood cultures and sputum culture -Respiratory viral panel and influenza PCR -ESR and lower respiratory tract Procalcitonin -Supportive care with oxygen and nebs -Hycodan as cough suppressant -Mild underlying dehydration so IV fluids at 75/hr -No evidence of heart failure on CT scan and no peripheral edema but for completeness of exam will obtain BNP-if elevated consider echocardiogram  Active Problems:   Asthma -Holding preadmission albuterol MDI in favor  of DuoNeb scheduled -Currently moving air well without wheezing so no indication for steroids at this juncture-recently completed steroid taper as outpatient    Pleuritic chest pain -Scheduled IV Toradol with PPI    Morbid obesity with BMI of 45.0-49.9, adult  -Weight reduction strategies per PCP    DVT prophylaxis: Lovenox Code Status: Full Family Communication: No family at bedside Disposition Plan: Home Consults called: None    ELLIS,ALLISON L. ANP-BC [facility] Pager [phone]   If 7PM-7AM, please contact  night-coverage [email or web address] Password [email or web address]  12/05/2016, 5:06 PM

## 2016-12-05 NOTE — Progress Notes (Signed)
VASCULAR LAB PRELIMINARY  PRELIMINARY  PRELIMINARY  PRELIMINARY  Right lower extremity venous duplex completed.    Preliminary report:  There is no obvious evidence of DVT or SVT noted in the right lower extremity. Full preliminary report may be found under results review.  Called results to  Dr. Heloise Purpura, [facility], RVT 12/05/2016, 1:29 PM

## 2016-12-05 NOTE — ED Triage Notes (Signed)
Pt c/o continues to have cough, shortness of breath and chest pain for more than a month. Pt recently seen here and diagnosed with pneumonia. Pt completed course of antibiotics. Pt reports yesterday she passed out after coughing.

## 2016-12-05 NOTE — ED Provider Notes (Signed)
[facility] EMERGENCY DEPARTMENT Provider Note   CSN: [id number] Arrival date & time: 12/05/16  3976     History   Chief Complaint Chief Complaint  Patient presents with  . Pneumonia  . Cough  . Shortness of Breath    HPI Crystal Gallagher is a 36 y.o. female.  HPI 36 year old African-American female past medical history significant for asthma presents to the emergency department today with complaints of ongoing productive cough, chest pain, shortness of breath.  Patient states that she was recently diagnosed with pneumonia 10/2.  She completed a 5-day course of azithromycin.  States that since then her symptoms have persisted.  She endorses exertional dyspnea, left lower side chest pain and states that she has been having a productive cough.  States that yesterday she coughed so much that she passed out.  She denies any associated fevers, chills, headache, vision changes, lightheadedness, dizziness.  She has tried no other medications for her symptoms at home.  Ambulation makes her symptoms worse.  Nothing makes better.  Also endorses some mild pleuritic chest pain and wheezing intermittently.  The patient has not used an inhaler.  Denies any known sick contacts.  Denies any history of cancer.  She denies any history of DVT/PE, prolonged immobilization, recent hospitalization/surgeries, unilateral leg swelling, OCP use, tobacco use.  She does report some pain in her right leg that is intermittent over the past few weeks but denies any associated swelling.  Denies any cardiac history.  Pt denies any fever, chill, ha, vision changes, lightheadedness, dizziness, congestion, neck pain,abd pain, n/v/d, urinary symptoms, change in bowel habits, melena, hematochezia, lower extremity paresthesias.  Past Medical History:  Diagnosis Date  . Asthma   . Pneumonia     There are no active problems to display for this patient.   Past Surgical History:  Procedure Laterality Date    . APPENDECTOMY    . BREAST SURGERY  breast reduction    OB History    No data available       Home Medications    Prior to Admission medications   Medication Sig Start Date End Date Taking? Authorizing Provider  albuterol (PROVENTIL HFA;VENTOLIN HFA) 108 (90 Base) MCG/ACT inhaler Inhale 1-2 puffs into the lungs every 6 (six) hours as needed for wheezing. 10/30/16  Yes Tanna Furry, MD  albuterol (PROVENTIL) (5 MG/ML) 0.5% nebulizer solution Take 0.5 mLs (2.5 mg total) by nebulization every 6 (six) hours as needed for wheezing or shortness of breath. 11/10/16  Yes Hedges, Dellis Filbert, PA-C  diphenhydrAMINE (BENADRYL) 25 MG tablet Take 50 mg by mouth at bedtime as needed for sleep.   Yes [provider]  levonorgestrel (MIRENA) 20 MCG/24HR IUD 1 each by Intrauterine route once.   Yes [provider]  benzonatate (TESSALON) 100 MG capsule Take 1 capsule (100 mg total) by mouth every 8 (eight) hours. Patient not taking: Reported on 12/05/2016 10/30/16   Tanna Furry, MD    Family History Family History  Problem Relation Age of Onset  . Cancer Other   . Hypertension Other     Social History Social History  Substance Use Topics  . Smoking status: Never Smoker  . Smokeless tobacco: Never Used  . Alcohol use No     Comment: occasionally     Allergies   Patient has no allergy information on record.   Review of Systems Review of Systems  Constitutional: Negative for chills and fever.  HENT: Negative for congestion.   Eyes:  Negative for visual disturbance.  Respiratory: Positive for cough, shortness of breath and wheezing.   Cardiovascular: Positive for chest pain.  Gastrointestinal: Negative for abdominal pain, diarrhea, nausea and vomiting.  Genitourinary: Negative for dysuria, flank pain, frequency, hematuria, urgency, vaginal bleeding and vaginal discharge.  Musculoskeletal: Negative for arthralgias and myalgias.  Skin: Negative for rash.  Neurological:  Positive for syncope. Negative for dizziness, weakness, light-headedness, numbness and headaches.  Psychiatric/Behavioral: Negative for sleep disturbance. The patient is not nervous/anxious.      Physical Exam Updated Vital Signs BP 118/65 (BP Location: Right Arm)   Pulse 75   Temp 98.8 F (37.1 C) (Oral)   Resp 18   Ht 5\' 6"  (1.676 m)   Wt 136.1 kg (300 lb)   SpO2 97%   BMI 48.42 kg/m   Physical Exam  Constitutional: She is oriented to person, place, and time. She appears well-developed and well-nourished.  Non-toxic appearance. No distress.  HENT:  Head: Normocephalic and atraumatic.  Nose: Nose normal.  Mouth/Throat: Oropharynx is clear and moist.  Eyes: Pupils are equal, round, and reactive to light. Conjunctivae are normal. Right eye exhibits no discharge. Left eye exhibits no discharge.  Neck: Normal range of motion. Neck supple.  Cardiovascular: Normal rate, regular rhythm, normal heart sounds and intact distal pulses.  Exam reveals no gallop and no friction rub.   No murmur heard. Pulmonary/Chest: Effort normal. No accessory muscle usage. Tachypnea noted. No respiratory distress. She has decreased breath sounds. She has wheezes (scattered expiraotry). She has rhonchi in the left lower field. She has no rales. She exhibits no tenderness.  Abdominal: Soft. Bowel sounds are normal. There is no tenderness. There is no rebound and no guarding.  Musculoskeletal: Normal range of motion. She exhibits no tenderness.  No lower extremity edema or calf tenderness.  DP pulses are 2+ bilaterally.  No pitting edema.  Lymphadenopathy:    She has no cervical adenopathy.  Neurological: She is alert and oriented to person, place, and time.  The patient is alert, attentive, and oriented x 3. Speech is clear. Cranial nerve II-VII grossly intact. Negative pronator drift. Sensation intact. Strength 5/5 in all extremities. Reflexes 2+ and symmetric at biceps, triceps, knees, and ankles. Rapid  alternating movement and fine finger movements intact. Romberg is absent. Posture and gait normal.   Skin: Skin is warm and dry. Capillary refill takes less than 2 seconds.  Psychiatric: Her behavior is normal. Judgment and thought content normal.  Nursing note and vitals reviewed.    ED Treatments / Results  Labs (all labs ordered are listed, but only abnormal results are displayed) Labs Reviewed  BASIC METABOLIC PANEL - Abnormal; Notable for the following:       Result Value   CO2 21 (*)    Glucose, Bld 124 (*)    All other components within normal limits  CBC WITH DIFFERENTIAL/PLATELET  POC URINE PREG, ED  I-STAT TROPONIN, ED    EKG  EKG Interpretation None       Radiology Ct Angio Chest Pe W/cm &/or Wo Cm  Result Date: 12/05/2016 CLINICAL DATA:  Chest pain, shortness of breath, and cough for approximately 1 month. PE suspected, high pretest prob EXAM: CT ANGIOGRAPHY CHEST WITH CONTRAST TECHNIQUE: Multidetector CT imaging of the chest was performed using the standard protocol during bolus administration of intravenous contrast. Multiplanar CT image reconstructions and MIPs were obtained to evaluate the vascular anatomy. CONTRAST:  100 mL Isovue 370 COMPARISON:  Chest radiograph on 11/30/2016  FINDINGS: Cardiovascular: Technically suboptimal exam due to large patient habitus and suboptimal opacification of pulmonary arteries. Evaluation of left lower lobe pulmonary arteries is also limited by pulmonary consolidation. Allowing for these limitations, no central pulmonary emboli identified. No evidence of thoracic aortic dissection or aneurysm. Mediastinum/Nodes: Mild mediastinal adenopathy is seen in the lateral aortic and subcarinal regions. Left hilar lymphadenopathy is also seen. No evidence of right hilar or axillary lymphadenopathy. Lungs/Pleura: Left lower lobe airspace disease is seen with central air bronchograms. This is suspicious for pneumonia. Central tracheobronchial  airways appear patent. No evidence of pleural effusion. No evidence of right lung infiltrate, however several right lung nodules or identified, largest measuring 4 mm on image 66/6. Upper abdomen: Small hiatal hernia. Musculoskeletal: No suspicious bone lesions identified. Review of the MIP images confirms the above findings. IMPRESSION: Technically suboptimal exam as discussed above. No central pulmonary emboli identified. Left lower lobe airspace disease with central air bronchograms, highly suspicious for pneumonia. Left hilar and mediastinal lymphadenopathy, which may be reactive in etiology although neoplasm cannot definitely be excluded. Recommend continued post treatment chest CT follow-up in 2-3 months. Indeterminate right lung nodules measuring up to 4 mm. Recommend continued attention on follow-up CT. Small hiatal hernia. Electronically Signed   By: Earle Gell M.D.   On: 12/05/2016 15:34    Procedures Procedures (including critical care time)  Medications Ordered in ED Medications  benzonatate (TESSALON) capsule 100 mg (100 mg Oral Given 12/05/16 1601)  iopamidol (ISOVUE-370) 76 % injection 100 mL (100 mLs Intravenous Contrast Given 12/05/16 1502)  ipratropium-albuterol (DUONEB) 0.5-2.5 (3) MG/3ML nebulizer solution 3 mL (3 mLs Nebulization Given 12/05/16 1600)     Initial Impression / Assessment and Plan / ED Course  I have reviewed the triage vital signs and the nursing notes.  Pertinent labs & imaging results that were available during my care of the patient were reviewed by me and considered in my medical decision making (see chart for details).     Patient presents to the ED with complaints of ongoing productive cough, shortness of breath, left-sided chest pain, pleuritic chest pain.  The patient's was treated for pneumonia on 10/2 with a 5-day course of azithromycin.  Patient states that she has had no change and has progressively worsened.  History of asthma.  Reports  intermittent wheezing.  Patient denies any PE/DVT risk factors.  She does endorse right lower leg pain but no associated edema.  Patient is afebrile in the ED.  She is mildly tachycardic and hypoxic at 93%.  Patient with some mild tachypnea.  On exam patient has rhonchorous breath sounds in the left lower lobe with scattered expiratory wheezes.  Tachypnea noted.  No chest wall tenderness to palpation.  Abdomen is benign.  Patient is neurovascularly intact in all extremities.  No focal neuro deficit.  Lab work is reassuring.  No leukocytosis.  Lateral eyes are normal.  Troponin is negative.  EKG shows sinus tachycardia but no ischemic changes.  Clinical presentation does not seem consistent with ACS, dissection.  Concern for possible PE versus worsening or returning pneumonia.  CTA of chest was ordered.  IMPRESSION: Technically suboptimal exam as discussed above. No central pulmonary emboli identified. Left lower lobe airspace disease with central air bronchograms, highly suspicious for pneumonia. Left hilar and mediastinal lymphadenopathy, which may be reactive in etiology although neoplasm cannot definitely be excluded. Recommend continued post treatment chest CT follow-up in 2-3 months. Indeterminate right lung nodules measuring up to 4 mm. Recommend continued  attention on follow-up CT. Small hiatal hernia.  Patient given DuoNeb for her expiratory wheezes with Tessalon.  Patient ambulated in the hallway with saturations dropping to 87%.  Tachypnea to 45 and she felt short of breath.  Discussed with patient possible admission versus discharge.  Given the patient was on a trial of antibiotics 2 weeks ago with persistent symptoms that are worsening and her hypoxia and tachypnea today in the ED feel that patient would benefit from admission with IV antibiotics.  Patient is agreeable to this plan.  Will consult hospital medicine.  We will start IV Levaquin.  Spoke with Ebony Hail the nurse practitioner  with tried hospital medicine.  Agrees to admission.  Will place admission orders.  Patient updated on plan of care.  All questions were answered.  Patient remains hemodynamic stable at this time.     Final Clinical Impressions(s) / ED Diagnoses   Final diagnoses:  Community acquired pneumonia of left lower lobe of lung [facility])    New Prescriptions New Prescriptions   No medications on file     Aaron Edelman 12/05/16 1656    Gareth Morgan, MD 12/06/16 [phone]

## 2016-12-05 NOTE — ED Notes (Addendum)
Ambulated Pt in hallway beginning SpO2 100%.  Pt became tachypneic upon ambulating with respirations 42 and SpO2 dropped to 87%. Pt returned to bed stating she was very short of breath.

## 2016-12-06 DIAGNOSIS — Z6841 Body Mass Index (BMI) 40.0 and over, adult: Secondary | ICD-10-CM

## 2016-12-06 DIAGNOSIS — J45909 Unspecified asthma, uncomplicated: Secondary | ICD-10-CM

## 2016-12-06 DIAGNOSIS — J181 Lobar pneumonia, unspecified organism: Secondary | ICD-10-CM | POA: Diagnosis not present

## 2016-12-06 LAB — COMPREHENSIVE METABOLIC PANEL
ALBUMIN: 3 g/dL — AB (ref 3.5–5.0)
ALK PHOS: 114 U/L (ref 38–126)
ALT: 12 U/L — AB (ref 14–54)
AST: 13 U/L — AB (ref 15–41)
Anion gap: 9 (ref 5–15)
BUN: 11 mg/dL (ref 6–20)
CALCIUM: 8.5 mg/dL — AB (ref 8.9–10.3)
CO2: 22 mmol/L (ref 22–32)
CREATININE: 0.71 mg/dL (ref 0.44–1.00)
Chloride: 107 mmol/L (ref 101–111)
GFR calc non Af Amer: >60 mL/min (ref >60)
GLUCOSE: 90 mg/dL (ref 65–99)
Potassium: 3.6 mmol/L (ref 3.5–5.1)
SODIUM: 138 mmol/L (ref 135–145)
Total Bilirubin: 0.4 mg/dL (ref 0.3–1.2)
Total Protein: 6.4 g/dL — ABNORMAL LOW (ref 6.5–8.1)

## 2016-12-06 LAB — RESPIRATORY PANEL BY PCR
ADENOVIRUS-RVPPCR: NOT DETECTED
Bordetella pertussis: NOT DETECTED
CHLAMYDOPHILA PNEUMONIAE-RVPPCR: NOT DETECTED
CORONAVIRUS 229E-RVPPCR: NOT DETECTED
CORONAVIRUS NL63-RVPPCR: NOT DETECTED
CORONAVIRUS OC43-RVPPCR: NOT DETECTED
Coronavirus HKU1: NOT DETECTED
Influenza A: NOT DETECTED
Influenza B: NOT DETECTED
MYCOPLASMA PNEUMONIAE-RVPPCR: NOT DETECTED
Metapneumovirus: NOT DETECTED
PARAINFLUENZA VIRUS 1-RVPPCR: NOT DETECTED
Parainfluenza Virus 2: NOT DETECTED
Parainfluenza Virus 3: NOT DETECTED
Parainfluenza Virus 4: NOT DETECTED
Respiratory Syncytial Virus: NOT DETECTED
Rhinovirus / Enterovirus: NOT DETECTED

## 2016-12-06 LAB — CBC
HCT: 36.6 % (ref 36.0–46.0)
HEMOGLOBIN: 11.7 g/dL — AB (ref 12.0–15.0)
MCH: 28.1 pg (ref 26.0–34.0)
MCHC: 32 g/dL (ref 30.0–36.0)
MCV: 88 fL (ref 78.0–100.0)
Platelets: 298 10*3/uL (ref 150–400)
RBC: 4.16 MIL/uL (ref 3.87–5.11)
RDW: 13.9 % (ref 11.5–15.5)
WBC: 7.7 10*3/uL (ref 4.0–10.5)

## 2016-12-06 LAB — HIV ANTIBODY (ROUTINE TESTING W REFLEX): HIV SCREEN 4TH GENERATION: NONREACTIVE

## 2016-12-06 MED ORDER — METHYLPREDNISOLONE SODIUM SUCC 125 MG IJ SOLR
40.0000 mg | INTRAMUSCULAR | Status: DC
  Administered 2016-12-06: 40 mg via INTRAVENOUS
  Filled 2016-12-06: qty 2

## 2016-12-06 MED ORDER — HYDROCODONE-HOMATROPINE 5-1.5 MG/5ML PO SYRP: 5.0000 mL | ORAL_SOLUTION | Freq: Three times a day (TID) | ORAL | 0 refills | Status: AC | PRN

## 2016-12-06 MED ORDER — ALBUTEROL SULFATE (2.5 MG/3ML) 0.083% IN NEBU: 2.5000 mg | INHALATION_SOLUTION | RESPIRATORY_TRACT | Status: DC | PRN

## 2016-12-06 MED ORDER — LEVOFLOXACIN 750 MG PO TABS
750.0000 mg | ORAL_TABLET | Freq: Every day | ORAL | 0 refills | Status: AC
Start: 2016-12-06 — End: 2016-12-13

## 2016-12-06 MED ORDER — PREDNISONE 20 MG PO TABS: 40.0000 mg | ORAL_TABLET | Freq: Every day | ORAL | 0 refills | Status: AC

## 2016-12-06 NOTE — Progress Notes (Signed)
Patient ambulated in hallway, oxygen saturation fluctuated between 96-98% during walk. Patient did complain of shortness of breath halfway through walk, approximately 100 feet. Florian Buff, RN

## 2016-12-06 NOTE — Discharge Summary (Signed)
Physician Discharge Summary  Crystal Gallagher NID:[id number] DOB: [birth year] DOA: 12/05/2016  PCP: Cari Caraway, MD  Admit date: 12/05/2016 Discharge date: 12/06/2016  Admitted From: Home Disposition:  Home  Recommendations for Outpatient Follow-up:  1. Follow up with PCP in 1-2 weeks 2. Please obtain CT chest with contrast in 2-3 months or  Home health: None  Equipment/Devices: None  Discharge Condition: Good home CODE STATUS: Full Diet recommendation: Bariatric  Brief/Interim Summary: 36 yo F with obesity, asthma presents with recurrent pneumonia, was briefly hypoxic in the ER.  PSI 28, treated with IV levaquin overnight, next day feeling better, no hypoxia with ambulation.  Discharged on Levaquin 7 days, prednisone 5 days.    Discharge Diagnoses:  Principal Problem:   CAP (community acquired pneumonia) Active Problems:   Acute respiratory failure with hypoxia ([place])   Asthma   Morbid obesity with BMI of 45.0-49.9, adult [facility])   Pleuritic chest pain    Discharge Instructions  Discharge Instructions    Diet general    Complete by:  As directed    Discharge instructions    Complete by:  As directed    From Dr. Loleta Books: Take levofloxacin/Levaquin 750 mg (1 tablet) once daily in the afternoon for 7 more days You may take prednisone 40 mg (two tablets) once daily for the next 4 mornings  Resume your home albuterol (either in your inhaler or with the nebulizer) as needed  The website for [facility] is: [email or web address] or [email or web address] For [facility]: [email or web address] Other private practices in town are: Dr. Criss Rosales, [place] Thirdly, contact your insurance to see what providers in the area they cover.  In three months, make sure to have your new PCP order a repeat CT scan of your chest   Increase activity slowly    Complete by:  As directed      Allergies as of 12/06/2016   Not on File     Medication List     TAKE these medications   albuterol 108 (90 Base) MCG/ACT inhaler Commonly known as:  PROVENTIL HFA;VENTOLIN HFA Inhale 1-2 puffs into the lungs every 6 (six) hours as needed for wheezing.   albuterol (5 MG/ML) 0.5% nebulizer solution Commonly known as:  PROVENTIL Take 0.5 mLs (2.5 mg total) by nebulization every 6 (six) hours as needed for wheezing or shortness of breath.   benzonatate 100 MG capsule Commonly known as:  TESSALON Take 1 capsule (100 mg total) by mouth every 8 (eight) hours.   diphenhydrAMINE 25 MG tablet Commonly known as:  BENADRYL Take 50 mg by mouth at bedtime as needed for sleep.   HYDROcodone-homatropine 5-1.5 MG/5ML syrup Commonly known as:  HYCODAN Take 5 mLs by mouth every 8 (eight) hours as needed for cough.   levofloxacin 750 MG tablet Commonly known as:  LEVAQUIN Take 1 tablet (750 mg total) by mouth daily.   levonorgestrel 20 MCG/24HR IUD Commonly known as:  MIRENA 1 each by Intrauterine route once.   predniSONE 20 MG tablet Commonly known as:  DELTASONE Take 2 tablets (40 mg total) by mouth daily.       Not on File  Consultations:  Right right   Procedures/Studies: Dg Chest 2 View  Result Date: 11/30/2016 CLINICAL DATA:  Cough and shortness of breath. Recent pneumonia diagnosis. EXAM: CHEST  2 VIEW COMPARISON:  Chest x-ray dated 11/10/2016. FINDINGS: Ill-defined opacity at the left lung base, most likely pneumonia. Right lung remains clear. Heart size and mediastinal contours are normal. No acute  or suspicious osseous finding. IMPRESSION: Persistent left lower lobe opacity. Given the persistence despite a course of antibiotics, would now consider chest CT to confirm benignity. Electronically Signed   By: Franki Cabot M.D.   On: 11/30/2016 21:39   Dg Chest 2 View  Result Date: 11/10/2016 CLINICAL DATA:  Initial evaluation for shortness of breath, cough, midsternal chest pressure. EXAM: CHEST  2 VIEW COMPARISON:  Prior radiograph from  10/30/2016 FINDINGS: Transverse heart size at the upper limits of normal. Mediastinal silhouette within normal limits. Lungs normally inflated. Patchy opacity within the left lung base, suspicious for possible infiltrate. No pulmonary edema. No appreciable pleural effusion. No pneumothorax. No acute osseus abnormality. IMPRESSION: Patchy left lower lobe opacity, suspicious for possible infiltrate given provided history of cough Electronically Signed   By: Jeannine Boga M.D.   On: 11/10/2016 16:02   Ct Angio Chest Pe W/cm &/or Wo Cm  Result Date: 12/05/2016 CLINICAL DATA:  Chest pain, shortness of breath, and cough for approximately 1 month. PE suspected, high pretest prob EXAM: CT ANGIOGRAPHY CHEST WITH CONTRAST TECHNIQUE: Multidetector CT imaging of the chest was performed using the standard protocol during bolus administration of intravenous contrast. Multiplanar CT image reconstructions and MIPs were obtained to evaluate the vascular anatomy. CONTRAST:  100 mL Isovue 370 COMPARISON:  Chest radiograph on 11/30/2016 FINDINGS: Cardiovascular: Technically suboptimal exam due to large patient habitus and suboptimal opacification of pulmonary arteries. Evaluation of left lower lobe pulmonary arteries is also limited by pulmonary consolidation. Allowing for these limitations, no central pulmonary emboli identified. No evidence of thoracic aortic dissection or aneurysm. Mediastinum/Nodes: Mild mediastinal adenopathy is seen in the lateral aortic and subcarinal regions. Left hilar lymphadenopathy is also seen. No evidence of right hilar or axillary lymphadenopathy. Lungs/Pleura: Left lower lobe airspace disease is seen with central air bronchograms. This is suspicious for pneumonia. Central tracheobronchial airways appear patent. No evidence of pleural effusion. No evidence of right lung infiltrate, however several right lung nodules or identified, largest measuring 4 mm on image 66/6. Upper abdomen: Small  hiatal hernia. Musculoskeletal: No suspicious bone lesions identified. Review of the MIP images confirms the above findings. IMPRESSION: Technically suboptimal exam as discussed above. No central pulmonary emboli identified. Left lower lobe airspace disease with central air bronchograms, highly suspicious for pneumonia. Left hilar and mediastinal lymphadenopathy, which may be reactive in etiology although neoplasm cannot definitely be excluded. Recommend continued post treatment chest CT follow-up in 2-3 months. Indeterminate right lung nodules measuring up to 4 mm. Recommend continued attention on follow-up CT. Small hiatal hernia. Electronically Signed   By: Earle Gell M.D.   On: 12/05/2016 15:34       Subjective: Feeling better this afternoon.  Still some cough when she lies down.  But breathing feels good with ambualtion.  She is about at her baseline in terms of breathlessness with ambualtion.  No pain in chest.  Discharge Exam: Vitals:   12/06/16 0617 12/06/16 1227  BP: (!) 97/51 116/65  Pulse: 81 96  Resp: 18 18  Temp: 98.7 F (37.1 C) 98.5 F (36.9 C)  SpO2: 99% 99%   Vitals:   12/05/16 1743 12/05/16 2021 12/06/16 0617 12/06/16 1227  BP: 103/71  (!) 97/51 116/65  Pulse: 88  81 96  Resp: 20  18 18   Temp: 98.4 F (36.9 C)  98.7 F (37.1 C) 98.5 F (36.9 C)  TempSrc: Oral  Oral Oral  SpO2: 97% 98% 99% 99%  Weight:  Height:        General: Pt is alert, awake, not in acute distress Cardiovascular: RRR, S1/S2 +, no rubs, no gallops Respiratory: CTA bilaterally, wheezing was present this mroning, now gone, breath sounds diminished throughout Abdominal: Soft, NT, ND, bowel sounds + Extremities: no edema, no cyanosis    The results of significant diagnostics from this hospitalization (including imaging, microbiology, ancillary and laboratory) are listed below for reference.     Microbiology: Recent Results (from the past 240 hour(s))  Respiratory Panel by PCR      Status: None   Collection Time: 12/05/16  7:27 PM  Result Value Ref Range Status   Adenovirus NOT DETECTED NOT DETECTED Final   Coronavirus 229E NOT DETECTED NOT DETECTED Final   Coronavirus HKU1 NOT DETECTED NOT DETECTED Final   Coronavirus NL63 NOT DETECTED NOT DETECTED Final   Coronavirus OC43 NOT DETECTED NOT DETECTED Final   Metapneumovirus NOT DETECTED NOT DETECTED Final   Rhinovirus / Enterovirus NOT DETECTED NOT DETECTED Final   Influenza A NOT DETECTED NOT DETECTED Final   Influenza B NOT DETECTED NOT DETECTED Final   Parainfluenza Virus 1 NOT DETECTED NOT DETECTED Final   Parainfluenza Virus 2 NOT DETECTED NOT DETECTED Final   Parainfluenza Virus 3 NOT DETECTED NOT DETECTED Final   Parainfluenza Virus 4 NOT DETECTED NOT DETECTED Final   Respiratory Syncytial Virus NOT DETECTED NOT DETECTED Final   Bordetella pertussis NOT DETECTED NOT DETECTED Final   Chlamydophila pneumoniae NOT DETECTED NOT DETECTED Final   Mycoplasma pneumoniae NOT DETECTED NOT DETECTED Final     Labs: BNP (last 3 results)  Recent Labs  12/05/16 1704  BNP 50.5   Basic Metabolic Panel:  Recent Labs Lab 11/30/16 2125 12/05/16 1046 12/06/16 0258  NA 137 137 138  K 3.9 3.7 3.6  CL 104 106 107  CO2 24 21* 22  GLUCOSE 95 124* 90  BUN 15 9 11   CREATININE 0.76 0.83 0.71  CALCIUM 9.3 8.9 8.5*   Liver Function Tests:  Recent Labs Lab 12/06/16 0258  AST 13*  ALT 12*  ALKPHOS 114  BILITOT 0.4  PROT 6.4*  ALBUMIN 3.0*   No results for input(s): LIPASE, AMYLASE in the last 168 hours. No results for input(s): AMMONIA in the last 168 hours. CBC:  Recent Labs Lab 11/30/16 2125 12/05/16 1046 12/06/16 0258  WBC 9.0 7.2 7.7  NEUTROABS  --  4.7  --   HGB 13.0 13.0 11.7*  HCT 40.1 40.7 36.6  MCV 87.6 88.5 88.0  PLT 355 333 298   Cardiac Enzymes: No results for input(s): CKTOTAL, CKMB, CKMBINDEX, TROPONINI in the last 168 hours. BNP: Invalid input(s): POCBNP CBG: No results for  input(s): GLUCAP in the last 168 hours. D-Dimer No results for input(s): DDIMER in the last 72 hours. Hgb A1c No results for input(s): HGBA1C in the last 72 hours. Lipid Profile No results for input(s): CHOL, HDL, LDLCALC, TRIG, CHOLHDL, LDLDIRECT in the last 72 hours. Thyroid function studies No results for input(s): TSH, T4TOTAL, T3FREE, THYROIDAB in the last 72 hours.  Invalid input(s): FREET3 Anemia work up No results for input(s): VITAMINB12, FOLATE, FERRITIN, TIBC, IRON, RETICCTPCT in the last 72 hours. Urinalysis No results found for: COLORURINE, APPEARANCEUR, [place], [place], [place], [place], [place], [place], PROTEINUR, UROBILINOGEN, NITRITE, LEUKOCYTESUR Sepsis Labs Invalid input(s): PROCALCITONIN,  WBC,  LACTICIDVEN Microbiology Recent Results (from the past 240 hour(s))  Respiratory Panel by PCR     Status: None   Collection Time: 12/05/16  7:27 PM  Result Value Ref Range Status   Adenovirus NOT DETECTED NOT DETECTED Final   Coronavirus 229E NOT DETECTED NOT DETECTED Final   Coronavirus HKU1 NOT DETECTED NOT DETECTED Final   Coronavirus NL63 NOT DETECTED NOT DETECTED Final   Coronavirus OC43 NOT DETECTED NOT DETECTED Final   Metapneumovirus NOT DETECTED NOT DETECTED Final   Rhinovirus / Enterovirus NOT DETECTED NOT DETECTED Final   Influenza A NOT DETECTED NOT DETECTED Final   Influenza B NOT DETECTED NOT DETECTED Final   Parainfluenza Virus 1 NOT DETECTED NOT DETECTED Final   Parainfluenza Virus 2 NOT DETECTED NOT DETECTED Final   Parainfluenza Virus 3 NOT DETECTED NOT DETECTED Final   Parainfluenza Virus 4 NOT DETECTED NOT DETECTED Final   Respiratory Syncytial Virus NOT DETECTED NOT DETECTED Final   Bordetella pertussis NOT DETECTED NOT DETECTED Final   Chlamydophila pneumoniae NOT DETECTED NOT DETECTED Final   Mycoplasma pneumoniae NOT DETECTED NOT DETECTED Final     Time coordinating discharge: Over 30 minutes  SIGNED:   Edwin Dada,  MD  [facility] 12/06/2016, 3:39 PM   If 7PM-7AM, please contact night-coverage [email or web address] Password [email or web address]

## 2016-12-10 LAB — CULTURE, BLOOD (ROUTINE X 2)
CULTURE: NO GROWTH
CULTURE: NO GROWTH
Special Requests: ADEQUATE
Special Requests: ADEQUATE

## 2016-12-21 ENCOUNTER — Encounter: Payer: Self-pay | Admitting: Family Medicine

## 2016-12-21 ENCOUNTER — Other Ambulatory Visit: Payer: Self-pay

## 2016-12-21 ENCOUNTER — Ambulatory Visit: Payer: 59 | Admitting: Family Medicine

## 2016-12-21 VITALS — BP 122/78 | HR 91 | Temp 99.3°F | Resp 18 | Ht 66.0 in | Wt 332.8 lb

## 2016-12-21 DIAGNOSIS — R05 Cough: Secondary | ICD-10-CM

## 2016-12-21 DIAGNOSIS — R0602 Shortness of breath: Secondary | ICD-10-CM

## 2016-12-21 DIAGNOSIS — J4551 Severe persistent asthma with (acute) exacerbation: Secondary | ICD-10-CM

## 2016-12-21 DIAGNOSIS — J181 Lobar pneumonia, unspecified organism: Secondary | ICD-10-CM

## 2016-12-21 DIAGNOSIS — J189 Pneumonia, unspecified organism: Secondary | ICD-10-CM

## 2016-12-21 DIAGNOSIS — R059 Cough, unspecified: Secondary | ICD-10-CM

## 2016-12-21 MED ORDER — PREDNISONE 10 MG (48) PO TBPK: ORAL_TABLET | ORAL | 0 refills | Status: DC

## 2016-12-21 MED ORDER — AZITHROMYCIN 250 MG PO TABS: ORAL_TABLET | ORAL | 0 refills | Status: DC

## 2016-12-21 NOTE — Patient Instructions (Addendum)
     IF you received an x-ray today, you will receive an invoice from [facility]. Please contact [facility] at [phone] with questions or concerns regarding your invoice.   IF you received labwork today, you will receive an invoice from [facility]. Please contact [facility] at [phone] with questions or concerns regarding your invoice.   Our billing staff will not be able to assist you with questions regarding bills from these companies.  You will be contacted with the lab results as soon as they are available. The fastest way to get your results is to activate your My Chart account. Instructions are located on the last page of this paperwork. If you have not heard from [place] regarding the results in 2 weeks, please contact this office.     Community-Acquired Pneumonia, Adult Pneumonia is an infection of the lungs. One type of pneumonia can happen while a person is in a hospital. A different type can happen when a person is not in a hospital (community-acquired pneumonia). It is easy for this kind to spread from person to person. It can spread to you if you breathe near an infected person who coughs or sneezes. Some symptoms include:  A dry cough.  A wet (productive) cough.  Fever.  Sweating.  Chest pain.  Follow these instructions at home:  Take over-the-counter and prescription medicines only as told by your doctor. ? Only take cough medicine if you are losing sleep. ? If you were prescribed an antibiotic medicine, take it as told by your doctor. Do not stop taking the antibiotic even if you start to feel better.  Sleep with your head and neck raised (elevated). You can do this by putting a few pillows under your head, or you can sleep in a recliner.  Do not use tobacco products. These include cigarettes, chewing tobacco, and e-cigarettes. If you need help quitting, ask your doctor.  Drink enough water to keep your pee (urine) clear or pale yellow. A shot  (vaccine) can help prevent pneumonia. Shots are often suggested for:  People older than 38 years of age.  People older than 38 years of age: ? Who are having cancer treatment. ? Who have long-term (chronic) lung disease. ? Who have problems with their body's defense system (immune system).  You may also prevent pneumonia if you take these actions:  Get the flu (influenza) shot every year.  Go to the dentist as often as told.  Wash your hands often. If soap and water are not available, use hand sanitizer.  Contact a doctor if:  You have a fever.  You lose sleep because your cough medicine does not help. Get help right away if:  You are short of breath and it gets worse.  You have more chest pain.  Your sickness gets worse. This is very serious if: ? You are an older adult. ? Your body's defense system is weak.  You cough up blood. This information is not intended to replace advice given to you by your health care provider. Make sure you discuss any questions you have with your health care provider. Document Released: 07/15/2007 Document Revised: 07/04/2015 Document Reviewed: 05/23/2014 Elsevier Interactive Patient Education  [employer].

## 2016-12-21 NOTE — Progress Notes (Signed)
Chief Complaint  Patient presents with  . New Patient (Initial Visit)    pt in hospital for pneumonia and still has cough and trouble breathing.  CT done saw pneumonia and pt put on antibiotic, pt says she is better but is still having problems breathing and cough    HPI Pt was discharged from the hospital on 12/06/16 She reports that she was admitted on 12/05/16 Per her discharge summary she was admitted with CAP, acute respiratory failure with hypoxia, asthma, morbid obesity with BMI of 45.0-49.9, pleuritic chest pain  Her CT chest 12/05/16- left lower lobe airspace disease with central air bronchograms, highly suspicious for pneumonia. Left hilar and mediastinal lymphadenopathy, which may be reactive in etiology although neoplasm cannot definitely be excluded.  Recommend continued post treatment chest CT follow-up in 2-3 months. Indeterminate right lung nodules measuring up to 85mm. Recommend continued attention on follow-up CT.  Per patient's report: She completed levaquin and prednisone She reports that every time she coughs she is having shortness of breath as well She states that the shortness of breath is slightly better She is doing a nebulizer which is not helping her She reports that she was given cough meds but gets no relief She reports that she coughs to the point where she feels weak She states that her cough is only occasionally productive She cannot lay flat because she feels like she is choking which make her cough even more She reports that she is using her albuterol 2-3 times at work but still cannot catch her breath She reports that her asthma was previously well controlled and was only triggers by dog or cat due to allergies.     Past Medical History:  Diagnosis Date  . Asthma   . Pneumonia     Current Outpatient Medications  Medication Sig Dispense Refill  . levonorgestrel (MIRENA) 20 MCG/24HR IUD 1 each by Intrauterine route once.    Marland Kitchen albuterol  (PROVENTIL HFA;VENTOLIN HFA) 108 (90 Base) MCG/ACT inhaler Inhale 1-2 puffs into the lungs every 6 (six) hours as needed for wheezing. (Patient not taking: Reported on 12/21/2016) 1 Inhaler 0  . albuterol (PROVENTIL) (5 MG/ML) 0.5% nebulizer solution Take 0.5 mLs (2.5 mg total) by nebulization every 6 (six) hours as needed for wheezing or shortness of breath. (Patient not taking: Reported on 12/21/2016) 20 mL 12  . azithromycin (ZITHROMAX) 250 MG tablet Take 2 tabs on day one then one tab each day after 6 tablet 0  . benzonatate (TESSALON) 100 MG capsule Take 1 capsule (100 mg total) by mouth every 8 (eight) hours. (Patient not taking: Reported on 12/05/2016) 21 capsule 0  . diphenhydrAMINE (BENADRYL) 25 MG tablet Take 50 mg by mouth at bedtime as needed for sleep.    . predniSONE (STERAPRED UNI-PAK 48 TAB) 10 MG (48) TBPK tablet Take 6 tabs on days 1-3, 5 tabs days 4-5, 4 tabs 6-7, 3 tabs day 8-9, 2 tabs days 10-11, 1 tab day 12-13 48 tablet 0   No current facility-administered medications for this visit.     Allergies: No Known Allergies  Past Surgical History:  Procedure Laterality Date  . APPENDECTOMY    . BREAST SURGERY  breast reduction    Social History   Socioeconomic History  . Marital status: Single    Spouse name: None  . Number of children: None  . Years of education: None  . Highest education level: None  Social Needs  . Financial resource strain: None  . Food  insecurity - worry: None  . Food insecurity - inability: None  . Transportation needs - medical: None  . Transportation needs - non-medical: None  Occupational History  . None  Tobacco Use  . Smoking status: Never Smoker  . Smokeless tobacco: Never Used  Substance and Sexual Activity  . Alcohol use: No    Comment: occasionally  . Drug use: No  . Sexual activity: None  Other Topics Concern  . None  Social History Narrative  . None    Family History  Problem Relation Age of Onset  . Cancer Other   .  Hypertension Other   . Arthritis Mother   . Colon cancer Father   . Breast cancer Maternal Aunt   . Colon cancer Paternal Uncle   . Stroke Maternal Grandmother   . Cancer Maternal Grandfather   . Hypertension Maternal Grandfather   . Alzheimer's disease Paternal Grandmother   . Breast cancer Maternal Aunt      ROS Review of Systems See HPI Constitution: No fevers or chills No malaise No diaphoresis Skin: No rash or itching Eyes: no blurry vision, no double vision GU: no dysuria or hematuria Neuro: no dizziness or headaches    Objective: Vitals:   12/21/16 1119  BP: 122/78  Pulse: 91  Resp: 18  Temp: 99.3 F (37.4 C)  TempSrc: Oral  SpO2: 97%  Weight: (!) 332 lb 12.8 oz (151 kg)  Height: 5\' 6"  (1.676 m)    Physical Exam  Constitutional: She is oriented to person, place, and time. She appears well-developed and well-nourished.  HENT:  Head: Normocephalic and atraumatic.  Eyes: Conjunctivae and EOM are normal.  Neck: Normal range of motion. No thyromegaly present.  Cardiovascular: Normal rate, regular rhythm and normal heart sounds.  No murmur heard. Pulmonary/Chest: Effort normal and breath sounds normal. No stridor. No respiratory distress.  Neurological: She is alert and oriented to person, place, and time.  Skin: Skin is warm. Capillary refill takes less than 2 seconds.  Psychiatric: She has a normal mood and affect. Her behavior is normal. Judgment and thought content normal.    Assessment and Plan Adrena was seen today for new patient (initial visit).  Diagnoses and all orders for this visit:  Severe persistent asthma with exacerbation Community acquired pneumonia of left lower lobe of lung (HCC) Cough Shortness of breath  Suprisingly patient has no wheezing but has difficulty with completing full sentences She could benefit from repeat steroid as well as repeat coarse of antibiotic She has low grade temperature Will do close follow up as pt is  still in asthma exacerbation   Other orders -     Cancel: Flu Vaccine QUAD 36+ mos IM -     Cancel: Tdap vaccine greater than or equal to 7yo IM -     predniSONE (STERAPRED UNI-PAK 48 TAB) 10 MG (48) TBPK tablet; Take 6 tabs on days 1-3, 5 tabs days 4-5, 4 tabs 6-7, 3 tabs day 8-9, 2 tabs days 10-11, 1 tab day 12-13 -     azithromycin (ZITHROMAX) 250 MG tablet; Take 2 tabs on day one then one tab each day after   A total of 30 minutes were spent face-to-face with the patient during this encounter and over half of that time was spent on counseling and coordination of care.  [place]

## 2016-12-26 ENCOUNTER — Ambulatory Visit: Payer: 59 | Admitting: Family Medicine

## 2016-12-26 ENCOUNTER — Encounter: Payer: Self-pay | Admitting: Family Medicine

## 2016-12-26 ENCOUNTER — Other Ambulatory Visit: Payer: Self-pay

## 2016-12-26 VITALS — BP 132/88 | HR 81 | Temp 99.1°F | Resp 17 | Ht 66.0 in | Wt 337.8 lb

## 2016-12-26 DIAGNOSIS — M5431 Sciatica, right side: Secondary | ICD-10-CM | POA: Diagnosis not present

## 2016-12-26 DIAGNOSIS — R05 Cough: Secondary | ICD-10-CM

## 2016-12-26 DIAGNOSIS — R0602 Shortness of breath: Secondary | ICD-10-CM | POA: Diagnosis not present

## 2016-12-26 DIAGNOSIS — R059 Cough, unspecified: Secondary | ICD-10-CM

## 2016-12-26 DIAGNOSIS — Z23 Encounter for immunization: Secondary | ICD-10-CM | POA: Diagnosis not present

## 2016-12-26 DIAGNOSIS — J9801 Acute bronchospasm: Secondary | ICD-10-CM

## 2016-12-26 MED ORDER — GUAIFENESIN-CODEINE 100-10 MG/5ML PO SOLN: 5.0000 mL | Freq: Every evening | ORAL | 0 refills | Status: DC | PRN

## 2016-12-26 NOTE — Patient Instructions (Addendum)
Apply aspercreme with lidocaine to the low back   Use albuterol inhaler for coughing  Use guaifenesin with codeine at bedtime to help suppress cough for sleep  Follow up in one week    IF you received an x-ray today, you will receive an invoice from [facility]. Please contact [facility] at [phone] with questions or concerns regarding your invoice.   IF you received labwork today, you will receive an invoice from [facility]. Please contact [facility] at [phone] with questions or concerns regarding your invoice.   Our billing staff will not be able to assist you with questions regarding bills from these companies.  You will be contacted with the lab results as soon as they are available. The fastest way to get your results is to activate your My Chart account. Instructions are located on the last page of this paperwork. If you have not heard from [place] regarding the results in 2 weeks, please contact this office.     Sciatica Sciatica is pain, numbness, weakness, or tingling along the path of the sciatic nerve. The sciatic nerve starts in the lower back and runs down the back of each leg. The nerve controls the muscles in the lower leg and in the back of the knee. It also provides feeling (sensation) to the back of the thigh, the lower leg, and the sole of the foot. Sciatica is a symptom of another medical condition that pinches or puts pressure on the sciatic nerve. Generally, sciatica only affects one side of the body. Sciatica usually goes away on its own or with treatment. In some cases, sciatica may keep coming back (recur). What are the causes? This condition is caused by pressure on the sciatic nerve, or pinching of the sciatic nerve. This may be the result of:  A disk in between the bones of the spine (vertebrae) bulging out too far (herniated disk).  Age-related changes in the spinal disks (degenerative disk disease).  A pain disorder that affects a  muscle in the buttock (piriformis syndrome).  Extra bone growth (bone spur) near the sciatic nerve.  An injury or break (fracture) of the pelvis.  Pregnancy.  Tumor (rare).  What increases the risk? The following factors may make you more likely to develop this condition:  Playing sports that place pressure or stress on the spine, such as football or weight lifting.  Having poor strength and flexibility.  A history of back injury.  A history of back surgery.  Sitting for long periods of time.  Doing activities that involve repetitive bending or lifting.  Obesity.  What are the signs or symptoms? Symptoms can vary from mild to very severe, and they may include:  Any of these problems in the lower back, leg, hip, or buttock: ? Mild tingling or dull aches. ? Burning sensations. ? Sharp pains.  Numbness in the back of the calf or the sole of the foot.  Leg weakness.  Severe back pain that makes movement difficult.  These symptoms may get worse when you cough, sneeze, or laugh, or when you sit or stand for long periods of time. Being overweight may also make symptoms worse. In some cases, symptoms may recur over time. How is this diagnosed? This condition may be diagnosed based on:  Your symptoms.  A physical exam. Your health care provider may ask you to do certain movements to check whether those movements trigger your symptoms.  You may have tests, including: ? Blood tests. ? X-rays. ? MRI. ?  CT scan.  How is this treated? In many cases, this condition improves on its own, without any treatment. However, treatment may include:  Reducing or modifying physical activity during periods of pain.  Exercising and stretching to strengthen your abdomen and improve the flexibility of your spine.  Icing and applying heat to the affected area.  Medicines that help: ? To relieve pain and swelling. ? To relax your muscles.  Injections of medicines that help to  relieve pain, irritation, and inflammation around the sciatic nerve (steroids).  Surgery.  Follow these instructions at home: Medicines  Take over-the-counter and prescription medicines only as told by your health care provider.  Do not drive or operate heavy machinery while taking prescription pain medicine. Managing pain  If directed, apply ice to the affected area. ? Put ice in a plastic bag. ? Place a towel between your skin and the bag. ? Leave the ice on for 20 minutes, 2-3 times a day.  After icing, apply heat to the affected area before you exercise or as often as told by your health care provider. Use the heat source that your health care provider recommends, such as a moist heat pack or a heating pad. ? Place a towel between your skin and the heat source. ? Leave the heat on for 20-30 minutes. ? Remove the heat if your skin turns bright red. This is especially important if you are unable to feel pain, heat, or cold. You may have a greater risk of getting burned. Activity  Return to your normal activities as told by your health care provider. Ask your health care provider what activities are safe for you. ? Avoid activities that make your symptoms worse.  Take brief periods of rest throughout the day. Resting in a lying or standing position is usually better than sitting to rest. ? When you rest for longer periods, mix in some mild activity or stretching between periods of rest. This will help to prevent stiffness and pain. ? Avoid sitting for long periods of time without moving. Get up and move around at least one time each hour.  Exercise and stretch regularly, as told by your health care provider.  Do not lift anything that is heavier than 10 lb (4.5 kg) while you have symptoms of sciatica. When you do not have symptoms, you should still avoid heavy lifting, especially repetitive heavy lifting.  When you lift objects, always use proper lifting technique, which  includes: ? Bending your knees. ? Keeping the load close to your body. ? Avoiding twisting. General instructions  Use good posture. ? Avoid leaning forward while sitting. ? Avoid hunching over while standing.  Maintain a healthy weight. Excess weight puts extra stress on your back and makes it difficult to maintain good posture.  Wear supportive, comfortable shoes. Avoid wearing high heels.  Avoid sleeping on a mattress that is too soft or too hard. A mattress that is firm enough to support your back when you sleep may help to reduce your pain.  Keep all follow-up visits as told by your health care provider. This is important. Contact a health care provider if:  You have pain that wakes you up when you are sleeping.  You have pain that gets worse when you lie down.  Your pain is worse than you have experienced in the past.  Your pain lasts longer than 4 weeks.  You experience unexplained weight loss. Get help right away if:  You lose control of your bowel  or bladder (incontinence).  You have: ? Weakness in your lower back, pelvis, buttocks, or legs that gets worse. ? Redness or swelling of your back. ? A burning sensation when you urinate. This information is not intended to replace advice given to you by your health care provider. Make sure you discuss any questions you have with your health care provider. Document Released: 01/20/2001 Document Revised: 07/02/2015 Document Reviewed: 10/05/2014 Elsevier Interactive Patient Education  2017 [employer].

## 2016-12-26 NOTE — Progress Notes (Signed)
Chief Complaint  Patient presents with  . Follow-up    establish care, cough not better-worsening, worse with walking and per pt has a hard time catching her breath. No drainage from cough  . right leg pain    x past month, starts in lower back and radiates down into leg, per pt feels like her leg is being twisted. Pain level 10/10- no pain while sitting only when walking    HPI  Pt reports that she continues to have very forceful cough ing that seems to be getting louder She reports that she  She reports that her cough is dry She states that she gets coughing spells and cannot catch her breath She states that any exercise puts her in a coughing spell She reports that the cough is harsh and feels like she has tingling in her chest and back and now she has numbness down her right back to the thigh to the knee  She is using her albuterol nebulizer, her albuterol inhaler, her steroid, her zpak and there is no improvement. She completed her zpak.    Past Medical History:  Diagnosis Date  . Asthma   . Pneumonia     Current Outpatient Medications  Medication Sig Dispense Refill  . diphenhydrAMINE (BENADRYL) 25 MG tablet Take 50 mg by mouth at bedtime as needed for sleep.    Marland Kitchen levonorgestrel (MIRENA) 20 MCG/24HR IUD 1 each by Intrauterine route once.    . predniSONE (STERAPRED UNI-PAK 48 TAB) 10 MG (48) TBPK tablet Take 6 tabs on days 1-3, 5 tabs days 4-5, 4 tabs 6-7, 3 tabs day 8-9, 2 tabs days 10-11, 1 tab day 12-13 48 tablet 0  . albuterol (PROVENTIL HFA;VENTOLIN HFA) 108 (90 Base) MCG/ACT inhaler Inhale 1-2 puffs into the lungs every 6 (six) hours as needed for wheezing. (Patient not taking: Reported on 12/21/2016) 1 Inhaler 0  . albuterol (PROVENTIL) (5 MG/ML) 0.5% nebulizer solution Take 0.5 mLs (2.5 mg total) by nebulization every 6 (six) hours as needed for wheezing or shortness of breath. (Patient not taking: Reported on 12/21/2016) 20 mL 12  . guaiFENesin-codeine 100-10 MG/5ML  syrup Take 5 mLs at bedtime as needed by mouth for cough. 100 mL 0   No current facility-administered medications for this visit.     Allergies: No Known Allergies  Past Surgical History:  Procedure Laterality Date  . APPENDECTOMY    . BREAST SURGERY  breast reduction    Social History   Socioeconomic History  . Marital status: Single    Spouse name: None  . Number of children: None  . Years of education: None  . Highest education level: None  Social Needs  . Financial resource strain: None  . Food insecurity - worry: None  . Food insecurity - inability: None  . Transportation needs - medical: None  . Transportation needs - non-medical: None  Occupational History  . None  Tobacco Use  . Smoking status: Never Smoker  . Smokeless tobacco: Never Used  Substance and Sexual Activity  . Alcohol use: No    Comment: occasionally  . Drug use: No  . Sexual activity: None  Other Topics Concern  . None  Social History Narrative  . None    Family History  Problem Relation Age of Onset  . Cancer Other   . Hypertension Other   . Arthritis Mother   . Colon cancer Father   . Breast cancer Maternal Aunt   . Colon cancer Paternal Uncle   .  Stroke Maternal Grandmother   . Cancer Maternal Grandfather   . Hypertension Maternal Grandfather   . Alzheimer's disease Paternal Grandmother   . Breast cancer Maternal Aunt      ROS Review of Systems See HPI Constitution: No fevers or chills No malaise No diaphoresis Skin: No rash or itching Eyes: no blurry vision, no double vision GU: no dysuria or hematuria Neuro: no dizziness or headaches    Objective: Vitals:   12/26/16 1039 12/26/16 1201  BP: 132/88   Pulse: 81   Resp: 17   Temp: 99.1 F (37.3 C)   TempSrc: Oral   SpO2: 97% 96%  Weight: (!) 337 lb 12.8 oz (153.2 kg)   Height: 5\' 6"  (1.676 m)     Physical Exam General: alert, oriented, in NAD Head: normocephalic, atraumatic, no sinus tenderness Eyes: EOM  intact, no scleral icterus or conjunctival injection Ears: TM clear bilaterally Nose: mucosa nonerythematous, nonedematous Throat: no pharyngeal exudate or erythema Lymph: no posterior auricular, submental or cervical lymph adenopathy Heart: normal rate, normal sinus rhythm, no murmurs Lungs: clear to auscultation bilaterally, no wheezing  Straight leg raise negative  Tender to palpation over the gluteus maximus  Assessment and Plan Kaho was seen today for follow-up and right leg pain.  Diagnoses and all orders for this visit:  Cough -     Check Pulse Oximetry while ambulating  Shortness of breath- oxygen levels maintained with ambulation -     Check Pulse Oximetry while ambulating  Right sided sciatica- advised stretches and topical rubs  Bronchospasm- use albuterol for cough to decrease bronchospasm  Other orders -     Tdap vaccine greater than or equal to 7yo IM -     Cancel: Flu Vaccine QUAD 36+ mos IM -     Discontinue: guaiFENesin-codeine 100-10 MG/5ML syrup; Take 5 mLs at bedtime as needed by mouth for cough. -     guaiFENesin-codeine 100-10 MG/5ML syrup; Take 5 mLs at bedtime as needed by mouth for cough.   Close follow up in 1 week  [place]

## 2016-12-28 ENCOUNTER — Telehealth: Payer: Self-pay | Admitting: Family Medicine

## 2016-12-28 NOTE — Telephone Encounter (Signed)
Copied from [place] [id number]. Topic: Quick Communication - See Telephone Encounter >> Dec 28, 2016  8:12 AM Robina Ade, Helene Kelp D wrote: CRM for notification. See Telephone encounter for: 12/28/16. Patient is requesting more time off work for this whole week. Patient is still not feeling better. Please call her when it is ready for pick up.

## 2017-01-01 NOTE — Progress Notes (Signed)
Chief Complaint  Patient presents with  . Asthma  . Pneumonia  . Follow-up    HPI  Pt here for follow up for asthma  She was seen in the ER and had hypoxia thus was admitted overnight and diagnosed with CAP and discharged home on levaquin and prednisone She continues to have bronchospasm and was seen on 12/21/16 and 12/26/2016 She is here today to follow up for asthma She is using her inhaler for her cough She states that the inhaler seems to irritate her cough She uses her albuterol nebulizer every 4 hours in the day time She feels like her cough is slightly better in severity but not any less frequent She had a few episodes of posttussive emesis but reports muscle soreness in her low back and neck. She applied aspercreme with no relief.  She continues to have stress incontinence from coughing. She had to take a leave of absence and will be applying for short term disability    Past Medical History:  Diagnosis Date  . Asthma   . Pneumonia     Current Outpatient Medications  Medication Sig Dispense Refill  . albuterol (PROVENTIL HFA;VENTOLIN HFA) 108 (90 Base) MCG/ACT inhaler Inhale 1-2 puffs into the lungs every 6 (six) hours as needed for wheezing. 1 Inhaler 0  . albuterol (PROVENTIL) (5 MG/ML) 0.5% nebulizer solution Take 0.5 mLs (2.5 mg total) by nebulization every 6 (six) hours as needed for wheezing or shortness of breath. 20 mL 12  . guaiFENesin-codeine 100-10 MG/5ML syrup Take 5 mLs at bedtime as needed by mouth for cough. 100 mL 0  . levonorgestrel (MIRENA) 20 MCG/24HR IUD 1 each by Intrauterine route once.    . diphenhydrAMINE (BENADRYL) 25 MG tablet Take 50 mg by mouth at bedtime as needed for sleep.    Marland Kitchen omeprazole (PRILOSEC) 20 MG capsule Take 1 capsule (20 mg total) by mouth daily. 30 capsule 3  . predniSONE (STERAPRED UNI-PAK 48 TAB) 10 MG (48) TBPK tablet Take 6 tabs on days 1-3, 5 tabs days 4-5, 4 tabs 6-7, 3 tabs day 8-9, 2 tabs days 10-11, 1 tab day 12-13  (Patient not taking: Reported on 01/04/2017) 48 tablet 0   No current facility-administered medications for this visit.     Allergies: No Known Allergies  Past Surgical History:  Procedure Laterality Date  . APPENDECTOMY    . BREAST SURGERY  breast reduction    Social History   Socioeconomic History  . Marital status: Single    Spouse name: None  . Number of children: None  . Years of education: None  . Highest education level: None  Social Needs  . Financial resource strain: None  . Food insecurity - worry: None  . Food insecurity - inability: None  . Transportation needs - medical: None  . Transportation needs - non-medical: None  Occupational History  . None  Tobacco Use  . Smoking status: Never Smoker  . Smokeless tobacco: Never Used  Substance and Sexual Activity  . Alcohol use: No    Comment: occasionally  . Drug use: No  . Sexual activity: None  Other Topics Concern  . None  Social History Narrative  . None    Family History  Problem Relation Age of Onset  . Cancer Other   . Hypertension Other   . Arthritis Mother   . Colon cancer Father   . Breast cancer Maternal Aunt   . Colon cancer Paternal Uncle   . Stroke Maternal Grandmother   .  Cancer Maternal Grandfather   . Hypertension Maternal Grandfather   . Alzheimer's disease Paternal Grandmother   . Breast cancer Maternal Aunt      ROS Review of Systems See HPI Constitution: No fevers or chills No malaise No diaphoresis Skin: No rash or itching Eyes: no blurry vision, no double vision GU: no dysuria or hematuria Neuro: no dizziness or headaches all others reviewed and negative   Objective: Vitals:   01/04/17 0833  BP: 122/84  Pulse: (!) 104  Resp: 18  Temp: 99.2 F (37.3 C)  TempSrc: Oral  SpO2: 97%  Weight: (!) 335 lb 3.2 oz (152 kg)  Height: 5\' 6"  (1.676 m)    Physical Exam General: alert, oriented, in NAD Head: normocephalic, atraumatic, no sinus tenderness Eyes: EOM  intact, no scleral icterus or conjunctival injection Ears: TM clear bilaterally Nose: mucosa nonerythematous, nonedematous Throat: no pharyngeal exudate or erythema Lymph: no posterior auricular, submental or cervical lymph adenopathy Heart: normal rate, normal sinus rhythm, no murmurs Lungs: clear to auscultation bilaterally, no wheezing  Spirometry with FEV1 64% EXPECTED CXR unchanged persistent pneumonia   Assessment and Plan Chrisandra was seen today for asthma, pneumonia and follow-up.  Diagnoses and all orders for this visit:  Cough -     Care order/instruction -     DG Chest 2 View  Bronchospasm -     Care order/instruction -     DG Chest 2 View  Community acquired pneumonia of left lower lobe of lung ([place]) -     Care order/instruction -     DG Chest 2 View  Persistent cough for 3 weeks or longer-  -     Care order/instruction -     DG Chest 2 View  Other orders -     Cancel: Flu Vaccine QUAD 36+ mos IM -     Cancel: Ambulatory referral to Pulmonology -     omeprazole (PRILOSEC) 20 MG capsule; Take 1 capsule (20 mg total) by mouth daily.  Discussed with patient that her spirometry is good considering her symptoms Also discussed that her cxr is stable and that it takes a long time for the consolidation seen in pneumonia to clear up.  At this point she is hemodynamically stable, lungs are clear and her cough is improving thus no need for antibiotics Advised continuing nebulizer and mdi albuterol Advised adding omeprazole  A total of 35 minutes were spent face-to-face with the patient during this encounter and over half of that time was spent on counseling and coordination of care.     Ray

## 2017-01-04 ENCOUNTER — Other Ambulatory Visit: Payer: Self-pay

## 2017-01-04 ENCOUNTER — Ambulatory Visit: Payer: 59 | Admitting: Family Medicine

## 2017-01-04 ENCOUNTER — Encounter: Payer: Self-pay | Admitting: Family Medicine

## 2017-01-04 ENCOUNTER — Ambulatory Visit (INDEPENDENT_AMBULATORY_CARE_PROVIDER_SITE_OTHER): Payer: 59

## 2017-01-04 VITALS — BP 122/84 | HR 104 | Temp 99.2°F | Resp 18 | Ht 66.0 in | Wt 335.2 lb

## 2017-01-04 DIAGNOSIS — J181 Lobar pneumonia, unspecified organism: Secondary | ICD-10-CM

## 2017-01-04 DIAGNOSIS — R05 Cough: Secondary | ICD-10-CM

## 2017-01-04 DIAGNOSIS — R059 Cough, unspecified: Secondary | ICD-10-CM

## 2017-01-04 DIAGNOSIS — R053 Chronic cough: Secondary | ICD-10-CM

## 2017-01-04 DIAGNOSIS — J9801 Acute bronchospasm: Secondary | ICD-10-CM | POA: Diagnosis not present

## 2017-01-04 DIAGNOSIS — J189 Pneumonia, unspecified organism: Secondary | ICD-10-CM

## 2017-01-04 MED ORDER — OMEPRAZOLE 20 MG PO CPDR: 20.0000 mg | DELAYED_RELEASE_CAPSULE | Freq: Every day | ORAL | 3 refills | Status: DC

## 2017-01-04 NOTE — Telephone Encounter (Signed)
Awaiting the forms

## 2017-01-04 NOTE — Patient Instructions (Signed)
     IF you received an x-ray today, you will receive an invoice from [facility]. Please contact [facility] at [phone] with questions or concerns regarding your invoice.   IF you received labwork today, you will receive an invoice from [facility]. Please contact [facility] at [phone] with questions or concerns regarding your invoice.   Our billing staff will not be able to assist you with questions regarding bills from these companies.  You will be contacted with the lab results as soon as they are available. The fastest way to get your results is to activate your My Chart account. Instructions are located on the last page of this paperwork. If you have not heard from us regarding the results in 2 weeks, please contact this office.     

## 2017-01-05 MED ORDER — LIDOCAINE 4 % EX PTCH: 1.0000 | MEDICATED_PATCH | Freq: Two times a day (BID) | CUTANEOUS | 1 refills | Status: DC | PRN

## 2017-01-06 NOTE — Progress Notes (Signed)
Chief Complaint  Patient presents with  . Follow-up    cough, the same per pt- the lidocaine patches, ibuprofen, excedrin and robitussin she says its keeping her together.  Per pt she will be returning to work on Monday and wanted to be checked out again before returning.  And needs clarity re: x ray results as nurse call and said no change from before.    HPI  Pt reports that she is taking iburprofen and lidocaine patches She reports that she has been helping her neck tightness and her right side back and hip pain She feels like the pain keeps her up at night and the coughing makes her tense all day   She had an xray that showed that her pneumonia was unchanged She did not understand what that meant since she is still coughing a lot without any improvement Her cough started 09/29/2016 with ER visits in 10/2016 for wheezing And admission overnight for hypoxia 12/05/16 She is using all the prescribed meds but is still coughing   4 review of systems  Past Medical History:  Diagnosis Date  . Asthma   . Pneumonia     Current Outpatient Medications  Medication Sig Dispense Refill  . albuterol (PROVENTIL HFA;VENTOLIN HFA) 108 (90 Base) MCG/ACT inhaler Inhale 1-2 puffs into the lungs every 6 (six) hours as needed for wheezing. 1 Inhaler 0  . albuterol (PROVENTIL) (5 MG/ML) 0.5% nebulizer solution Take 0.5 mLs (2.5 mg total) by nebulization every 6 (six) hours as needed for wheezing or shortness of breath. 20 mL 12  . diphenhydrAMINE (BENADRYL) 25 MG tablet Take 50 mg by mouth at bedtime as needed for sleep.    Marland Kitchen guaiFENesin-codeine 100-10 MG/5ML syrup Take 5 mLs at bedtime as needed by mouth for cough. 100 mL 0  . levonorgestrel (MIRENA) 20 MCG/24HR IUD 1 each by Intrauterine route once.    . Lidocaine 4 % PTCH Apply 1 patch topically every 12 (twelve) hours as needed. 30 patch 1  . omeprazole (PRILOSEC) 20 MG capsule Take 1 capsule (20 mg total) by mouth daily. 30 capsule 3  .  predniSONE (STERAPRED UNI-PAK 48 TAB) 10 MG (48) TBPK tablet Take 6 tabs on days 1-3, 5 tabs days 4-5, 4 tabs 6-7, 3 tabs day 8-9, 2 tabs days 10-11, 1 tab day 12-13 (Patient not taking: Reported on 01/04/2017) 48 tablet 0   No current facility-administered medications for this visit.     Allergies: No Known Allergies  Past Surgical History:  Procedure Laterality Date  . APPENDECTOMY    . BREAST SURGERY  breast reduction    Social History   Socioeconomic History  . Marital status: Single    Spouse name: Not on file  . Number of children: Not on file  . Years of education: Not on file  . Highest education level: Not on file  Social Needs  . Financial resource strain: Not on file  . Food insecurity - worry: Not on file  . Food insecurity - inability: Not on file  . Transportation needs - medical: Not on file  . Transportation needs - non-medical: Not on file  Occupational History  . Not on file  Tobacco Use  . Smoking status: Never Smoker  . Smokeless tobacco: Never Used  Substance and Sexual Activity  . Alcohol use: No    Comment: occasionally  . Drug use: No  . Sexual activity: Not on file  Other Topics Concern  . Not on file  Social History  Narrative  . Not on file    Family History  Problem Relation Age of Onset  . Cancer Other   . Hypertension Other   . Arthritis Mother   . Colon cancer Father   . Breast cancer Maternal Aunt   . Colon cancer Paternal Uncle   . Stroke Maternal Grandmother   . Cancer Maternal Grandfather   . Hypertension Maternal Grandfather   . Alzheimer's disease Paternal Grandmother   . Breast cancer Maternal Aunt      ROS Review of Systems See HPI Constitution: No fevers or chills No malaise No diaphoresis Skin: No rash or itching Eyes: no blurry vision, no double vision GU: no dysuria or hematuria Neuro: no dizziness or headaches all others reviewed and negative   Objective: Vitals:   01/07/17 1035  BP: 116/90  Pulse:  (!) 105  Resp: 17  Temp: 99.1 F (37.3 C)  TempSrc: Oral  SpO2: 97%  Weight: (!) 329 lb 3.2 oz (149.3 kg)  Height: 5\' 6"  (1.676 m)    Physical Exam  Constitutional: She is oriented to person, place, and time. She appears well-developed and well-nourished.  HENT:  Head: Normocephalic and atraumatic.  Right Ear: External ear normal.  Left Ear: External ear normal.  Mouth/Throat: Oropharynx is clear and moist.  Eyes: Conjunctivae and EOM are normal.  Cardiovascular: Normal rate, regular rhythm and normal heart sounds.  No murmur heard. Pulmonary/Chest: Effort normal and breath sounds normal. No stridor. No respiratory distress. She has no wheezes.  Musculoskeletal:       Cervical back: She exhibits spasm.       Back:  Neurological: She is alert and oriented to person, place, and time.  Skin: Skin is warm. Capillary refill takes less than 2 seconds.  Psychiatric: She has a normal mood and affect. Her behavior is normal. Judgment and thought content normal.   CXR 01/04/2017 PERSISTENT LEFT LOWER LOBE PNEUMONIA UNCHNAGED  Assessment and Plan Tauna was seen today for follow-up.  Diagnoses and all orders for this visit:  Persistent cough for 3 weeks or longer Community acquired pneumonia of left lower lobe of lung ([place]) Severe persistent asthma with exacerbation -     Ambulatory referral to Pulmonology Continue current medications Discussed that she should establish care with Pulmonology  Right sided sciatica SI (sacroiliac) joint dysfunction      -    Referral to PT      -    Lidocaine and ibuprofen to be continued  Completed FMLA form Reviewed medical records and both xrays with patient Reviewed proper technique of albuterol  A total of 25 minutes were spent face-to-face with the patient during this encounter and over half of that time was spent on counseling and coordination of care.   [place]

## 2017-01-07 ENCOUNTER — Other Ambulatory Visit: Payer: Self-pay

## 2017-01-07 ENCOUNTER — Ambulatory Visit: Payer: 59 | Admitting: Family Medicine

## 2017-01-07 ENCOUNTER — Encounter: Payer: Self-pay | Admitting: Family Medicine

## 2017-01-07 VITALS — BP 116/90 | HR 105 | Temp 99.1°F | Resp 17 | Ht 66.0 in | Wt 329.2 lb

## 2017-01-07 DIAGNOSIS — M533 Sacrococcygeal disorders, not elsewhere classified: Secondary | ICD-10-CM

## 2017-01-07 DIAGNOSIS — J181 Lobar pneumonia, unspecified organism: Secondary | ICD-10-CM

## 2017-01-07 DIAGNOSIS — R05 Cough: Secondary | ICD-10-CM

## 2017-01-07 DIAGNOSIS — R053 Chronic cough: Secondary | ICD-10-CM

## 2017-01-07 DIAGNOSIS — J4551 Severe persistent asthma with (acute) exacerbation: Secondary | ICD-10-CM | POA: Diagnosis not present

## 2017-01-07 DIAGNOSIS — M5431 Sciatica, right side: Secondary | ICD-10-CM | POA: Diagnosis not present

## 2017-01-07 DIAGNOSIS — J189 Pneumonia, unspecified organism: Secondary | ICD-10-CM

## 2017-01-12 ENCOUNTER — Encounter: Payer: Self-pay | Admitting: Emergency Medicine

## 2017-01-12 ENCOUNTER — Ambulatory Visit (INDEPENDENT_AMBULATORY_CARE_PROVIDER_SITE_OTHER): Payer: 59 | Admitting: Emergency Medicine

## 2017-01-12 DIAGNOSIS — R053 Chronic cough: Secondary | ICD-10-CM | POA: Insufficient documentation

## 2017-01-12 DIAGNOSIS — R05 Cough: Secondary | ICD-10-CM

## 2017-01-12 MED ORDER — HYDROCODONE-HOMATROPINE 5-1.5 MG/5ML PO SYRP: 5.0000 mL | ORAL_SOLUTION | Freq: Four times a day (QID) | ORAL | 0 refills | Status: DC | PRN
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
## 2017-01-12 NOTE — Patient Instructions (Signed)
We will arrange for a bronchoscopy to better evaluate your airways and your left lower lung Please increase your omeprazole (Prilosec) to 20 mg twice a day.  Remember to take this medication 30-60 minutes before or after eating We will perform pulmonary function testing at your next office visit Please use Hycodan 5 cc up to every 6 hours as needed for cough suppression Follow with Dr Lamonte Sakai next available with full PFT same day.
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
## 2017-01-13 ENCOUNTER — Ambulatory Visit: Payer: Self-pay | Admitting: *Deleted

## 2017-01-13 NOTE — Telephone Encounter (Signed)
Pt called complaining of worsening cough that causes shortness of breath with exertion; she was seen by Dr Delia Chimes on 01/07/17 and Dr Rivka Barbara on 01/12/17; she states that the cough medication is not working, and anytime she exerts herself she coughs and gets short of breath; pt states that she about to have a bronchoscopy by Dr Lamonte Sakai on Monday; spoke with Peter Congo at [facility] and the recommendation was made that the pt be re-evaluated by Pulmonary; spoke with Wilburn Cornelia at [facility]; pt offered and accepted 9 am appointment on 01/14/17 with Dr Lamonte Sakai; pt verbalizes understanding; pt also given instructions per nurse triage and verbalizes understanding. Appointment with Dr Lamonte Sakai scheduled by [place].  Reason for Disposition . SEVERE coughing spells (e.g., whooping sound after coughing, vomiting after coughing)  Answer Assessment - Initial Assessment Questions 1. ONSET: "When did the cough begin?"      Saw MD on 01/07/17 and 01/12/17 2. SEVERITY: "How bad is the cough today?"      severe 3. RESPIRATORY DISTRESS: "Describe your breathing."      Short of breath associated with exertion, then having worsening cough 4. FEVER: "Do you have a fever?" If so, ask: "What is your temperature, how was it measured, and when did it start?"     Not sure; but is hot 5. HEMOPTYSIS: "Are you coughing up any blood?" If so ask: "How much?" (flecks, streaks, tablespoons, etc.)     no 6. TREATMENT: "What have you done so far to treat the cough?" (e.g., meds, fluids, humidifier)     medication 7. CARDIAC HISTORY: "Do you have any history of heart disease?" (e.g., heart attack, congestive heart failure)      no 8. LUNG HISTORY: "Do you have any history of lung disease?"  (e.g., pulmonary embolus, asthma, emphysema)     asthma 9. PE RISK FACTORS: "Do you have a history of blood clots?" (or: recent major surgery, recent prolonged travel, bedridden )     no 10. OTHER SYMPTOMS: "Do you have any other symptoms? (e.g.,  runny nose, wheezing, chest pain)       Chest hurts when coughing 11. PREGNANCY: "Is there any chance you are pregnant?" "When was your last menstrual period?"       No IUD 12. TRAVEL: "Have you traveled out of the country in the last month?" (e.g., travel history, exposures)       no  Protocols used: COUGH - ACUTE NON-PRODUCTIVE-A-AH

## 2017-01-14 ENCOUNTER — Ambulatory Visit: Payer: 59 | Admitting: Emergency Medicine

## 2017-01-14 ENCOUNTER — Encounter: Payer: Self-pay | Admitting: *Deleted

## 2017-01-14 ENCOUNTER — Encounter: Payer: Self-pay | Admitting: Emergency Medicine

## 2017-01-14 DIAGNOSIS — R053 Chronic cough: Secondary | ICD-10-CM

## 2017-01-14 DIAGNOSIS — R05 Cough: Secondary | ICD-10-CM

## 2017-01-14 NOTE — Assessment & Plan Note (Signed)
Chronic cough.  She is concerned about returning to work on her current state.  This is understandable.  I explained to her that I do not know that we will necessarily be able to keep her out of work while we do the complete workup for her chronic cough and dyspnea.  She understands this.  I do believe that is reasonable to keep her out for at least the next week while we get her bronchoscopy done and follow-up initial results.  We will proceed with your bronchoscopy on 01/18/17. We will plan to perform pulmonary function testing in January as already scheduled. Continue your omeprazole as discussed at your last visit. Use Hycodan for cough suppression as we discussed at your last visit. You should not return to work before 01/25/17. Follow with Dr Lamonte Sakai as already scheduled.

## 2017-01-14 NOTE — Progress Notes (Signed)
Subjective:    Patient ID: Crystal Gallagher, female    DOB: [birth year], 36 y.o.   MRN: [id number]  HPI 36 year old woman, never smoker, with obesity.  She is referred today for chronic cough.  She carries a history of asthma that was made as a teenager in setting exercise. No recent flares in several years. She began to have cough in August after she was exposed to smoke, was associated with some wheeze. Improved with benadryl and albuterol. The cough persisted so she was seen in ED, tessalon without effect.has been seen several times and noted to have LLL infiltrate. Imaging as below. Was treated with azithro and then levaquin. Cough has mainly non-productive, sometimes causes emesis or syncope. Took prednisone most recently > no change in the cough.  No fever, has had some fatigue. Denies rash, fevers, chills. She is having dyspnea.   CXR 11/10/16 >> patchy LLL opacity, ? PNA CT-PA 12/05/16 >> persistent LLL infiltrate  ROV 01/14/17 --very pleasant 36 year old woman seen 2 days ago for refractory cough of approximately 3-1/2 months duration.  This was associated with a left lower lobe infiltrate on CT scan and then chest x-ray for which she was treated for a community-acquired pneumonia.  She returns today with continued symptoms, barking cough. She is concerned about being back at work - tried to go back 01/11/17, has been very difficult.  Lots of coughing comp gated by some tussive emesis, tussive micturation.                                              Review of Systems  Constitutional: Negative for fever and unexpected weight change.  HENT: Positive for sore throat. Negative for congestion, dental problem, ear pain, nosebleeds, postnasal drip, rhinorrhea, sinus pressure, sneezing and trouble swallowing.   Eyes: Negative for redness and itching.  Respiratory: Positive for cough and shortness of breath. Negative for chest tightness and wheezing.   Cardiovascular: Negative for palpitations and leg  swelling.  Gastrointestinal: Negative for nausea and vomiting.  Genitourinary: Negative for dysuria.  Musculoskeletal: Negative for joint swelling.  Skin: Negative for rash.  Neurological: Negative for headaches.  Hematological: Does not bruise/bleed easily.  Psychiatric/Behavioral: Negative for dysphoric mood. The patient is not nervous/anxious.     Past Medical History:  Diagnosis Date  . Asthma   . Pneumonia      Family History  Problem Relation Age of Onset  . Cancer Other   . Hypertension Other   . Arthritis Mother   . Colon cancer Father   . Breast cancer Maternal Aunt   . Colon cancer Paternal Uncle   . Stroke Maternal Grandmother   . Cancer Maternal Grandfather   . Hypertension Maternal Grandfather   . Alzheimer's disease Paternal Grandmother   . Breast cancer Maternal Aunt      Social History   Socioeconomic History  . Marital status: Single    Spouse name: Not on file  . Number of children: Not on file  . Years of education: Not on file  . Highest education level: Not on file  Social Needs  . Financial resource strain: Not on file  . Food insecurity - worry: Not on file  . Food insecurity - inability: Not on file  . Transportation needs - medical: Not on file  . Transportation needs - non-medical: Not on file  Occupational  History  . Not on file  Tobacco Use  . Smoking status: Never Smoker  . Smokeless tobacco: Never Used  Substance and Sexual Activity  . Alcohol use: No    Comment: occasionally  . Drug use: No  . Sexual activity: Not on file  Other Topics Concern  . Not on file  Social History Narrative  . Not on file     No Known Allergies   Outpatient Medications Prior to Visit  Medication Sig Dispense Refill  . Acetaminophen-Aspirin Buffered (EXCEDRIN BACK & BODY) 250-250 MG tablet Take 1 tablet by mouth every 4 (four) hours as needed for pain.    Marland Kitchen albuterol (PROVENTIL HFA;VENTOLIN HFA) 108 (90 Base) MCG/ACT inhaler Inhale 1-2 puffs  into the lungs every 6 (six) hours as needed for wheezing. 1 Inhaler 0  . albuterol (PROVENTIL) (5 MG/ML) 0.5% nebulizer solution Take 0.5 mLs (2.5 mg total) by nebulization every 6 (six) hours as needed for wheezing or shortness of breath. 20 mL 12  . guaifenesin (ROBITUSSIN) 100 MG/5ML syrup Take 200 mg by mouth 3 (three) times daily as needed for cough.    Marland Kitchen HYDROcodone-homatropine (HYCODAN) 5-1.5 MG/5ML syrup Take 5 mLs by mouth every 6 (six) hours as needed for cough. 240 mL 0  . levonorgestrel (MIRENA) 20 MCG/24HR IUD 1 each by Intrauterine route once.    . Lidocaine 4 % PTCH Apply 1 patch topically every 12 (twelve) hours as needed. (Patient taking differently: Apply 1 patch topically every 12 (twelve) hours as needed (for pain.). ) 30 patch 1  . omeprazole (PRILOSEC) 20 MG capsule Take 1 capsule (20 mg total) by mouth daily. (Patient taking differently: Take 20 mg by mouth 2 (two) times daily before a meal. ) 30 capsule 3   No facility-administered medications prior to visit.         Objective:   Physical Exam Vitals:   01/14/17 0857  BP: 110/80  Pulse: 94  SpO2: 97%  Weight: (!) 330 lb (149.7 kg)  Height: 5\' 6"  (1.676 m)   Gen: Pleasant, obese woman, in no distress,  normal affect, frequent dry cough  ENT: No lesions,  mouth clear,  oropharynx clear, no postnasal drip  Neck: No JVD, no stridor, strong voice  Lungs: No use of accessory muscles, no wheezing, no crackles  Cardiovascular: RRR, heart sounds normal, no murmur or gallops, no peripheral edema  Musculoskeletal: No deformities, no cyanosis or clubbing  Neuro: alert, non focal  Skin: Warm, no lesions or rashes      Assessment & Plan:  Chronic cough Chronic cough.  She is concerned about returning to work on her current state.  This is understandable.  I explained to her that I do not know that we will necessarily be able to keep her out of work while we do the complete workup for her chronic cough and  dyspnea.  She understands this.  I do believe that is reasonable to keep her out for at least the next week while we get her bronchoscopy done and follow-up initial results.  We will proceed with your bronchoscopy on 01/18/17. We will plan to perform pulmonary function testing in January as already scheduled. Continue your omeprazole as discussed at your last visit. Use Hycodan for cough suppression as we discussed at your last visit. You should not return to work before 01/25/17. Follow with Dr Lamonte Sakai as already scheduled.  Baltazar Apo, MD, PhD 01/14/2017, 9:15 AM [facility] [phone] or if no  answer [phone]

## 2017-01-14 NOTE — Patient Instructions (Addendum)
We will proceed with your bronchoscopy on 01/18/17. We will plan to perform pulmonary function testing in January as already scheduled. Continue your omeprazole as discussed at your last visit. Use Hycodan for cough suppression as we discussed at your last visit. You should not return to work before 01/25/17. Follow with Dr Lamonte Sakai as already scheduled.

## 2017-01-17 ENCOUNTER — Emergency Department (HOSPITAL_COMMUNITY): Payer: 59

## 2017-01-17 ENCOUNTER — Other Ambulatory Visit: Payer: Self-pay

## 2017-01-17 ENCOUNTER — Encounter (HOSPITAL_COMMUNITY): Payer: Self-pay | Admitting: Oncology

## 2017-01-17 ENCOUNTER — Inpatient Hospital Stay (HOSPITAL_COMMUNITY)
Admission: EM | Admit: 2017-01-17 | Discharge: 2017-01-23 | DRG: 175 | Disposition: A | Discharge status: Home / Self Care | Payer: 59 | Attending: Internal Medicine | Admitting: Internal Medicine

## 2017-01-17 DIAGNOSIS — Z79899 Other long term (current) drug therapy: Secondary | ICD-10-CM | POA: Diagnosis not present

## 2017-01-17 DIAGNOSIS — I2609 Other pulmonary embolism with acute cor pulmonale: Secondary | ICD-10-CM | POA: Diagnosis not present

## 2017-01-17 DIAGNOSIS — J189 Pneumonia, unspecified organism: Secondary | ICD-10-CM | POA: Diagnosis present

## 2017-01-17 DIAGNOSIS — Z975 Presence of (intrauterine) contraceptive device: Secondary | ICD-10-CM | POA: Diagnosis not present

## 2017-01-17 DIAGNOSIS — R0789 Other chest pain: Secondary | ICD-10-CM

## 2017-01-17 DIAGNOSIS — I340 Nonrheumatic mitral (valve) insufficiency: Secondary | ICD-10-CM | POA: Diagnosis present

## 2017-01-17 DIAGNOSIS — Z8701 Personal history of pneumonia (recurrent): Secondary | ICD-10-CM | POA: Diagnosis not present

## 2017-01-17 DIAGNOSIS — R918 Other nonspecific abnormal finding of lung field: Secondary | ICD-10-CM | POA: Diagnosis present

## 2017-01-17 DIAGNOSIS — N939 Abnormal uterine and vaginal bleeding, unspecified: Secondary | ICD-10-CM | POA: Diagnosis present

## 2017-01-17 DIAGNOSIS — J9819 Other pulmonary collapse: Secondary | ICD-10-CM | POA: Diagnosis present

## 2017-01-17 DIAGNOSIS — R55 Syncope and collapse: Secondary | ICD-10-CM | POA: Diagnosis present

## 2017-01-17 DIAGNOSIS — K5900 Constipation, unspecified: Secondary | ICD-10-CM | POA: Diagnosis present

## 2017-01-17 DIAGNOSIS — M25512 Pain in left shoulder: Secondary | ICD-10-CM

## 2017-01-17 DIAGNOSIS — E669 Obesity, unspecified: Secondary | ICD-10-CM | POA: Diagnosis not present

## 2017-01-17 DIAGNOSIS — R05 Cough: Secondary | ICD-10-CM

## 2017-01-17 DIAGNOSIS — Z6841 Body Mass Index (BMI) 40.0 and over, adult: Secondary | ICD-10-CM | POA: Diagnosis not present

## 2017-01-17 DIAGNOSIS — I2699 Other pulmonary embolism without acute cor pulmonale: Secondary | ICD-10-CM | POA: Diagnosis present

## 2017-01-17 DIAGNOSIS — J4530 Mild persistent asthma, uncomplicated: Secondary | ICD-10-CM

## 2017-01-17 DIAGNOSIS — R000 Tachycardia, unspecified: Secondary | ICD-10-CM | POA: Diagnosis not present

## 2017-01-17 DIAGNOSIS — I10 Essential (primary) hypertension: Secondary | ICD-10-CM | POA: Diagnosis present

## 2017-01-17 DIAGNOSIS — Y95 Nosocomial condition: Secondary | ICD-10-CM | POA: Diagnosis present

## 2017-01-17 DIAGNOSIS — R590 Localized enlarged lymph nodes: Secondary | ICD-10-CM | POA: Diagnosis present

## 2017-01-17 DIAGNOSIS — M79606 Pain in leg, unspecified: Secondary | ICD-10-CM | POA: Diagnosis not present

## 2017-01-17 DIAGNOSIS — J9601 Acute respiratory failure with hypoxia: Secondary | ICD-10-CM | POA: Diagnosis present

## 2017-01-17 DIAGNOSIS — J45909 Unspecified asthma, uncomplicated: Secondary | ICD-10-CM | POA: Diagnosis present

## 2017-01-17 DIAGNOSIS — J180 Bronchopneumonia, unspecified organism: Secondary | ICD-10-CM | POA: Diagnosis not present

## 2017-01-17 DIAGNOSIS — R054 Cough syncope: Secondary | ICD-10-CM

## 2017-01-17 DIAGNOSIS — R0682 Tachypnea, not elsewhere classified: Secondary | ICD-10-CM | POA: Diagnosis not present

## 2017-01-17 LAB — BASIC METABOLIC PANEL
Anion gap: 11 (ref 5–15)
BUN: 10 mg/dL (ref 6–20)
CO2: 18 mmol/L — ABNORMAL LOW (ref 22–32)
Calcium: 8.9 mg/dL (ref 8.9–10.3)
Chloride: 107 mmol/L (ref 101–111)
Creatinine, Ser: 0.62 mg/dL (ref 0.44–1.00)
GFR calc Af Amer: >60 mL/min (ref >60)
GFR calc non Af Amer: >60 mL/min (ref >60)
Glucose, Bld: 94 mg/dL (ref 65–99)
Potassium: 3.6 mmol/L (ref 3.5–5.1)
Sodium: 136 mmol/L (ref 135–145)

## 2017-01-17 LAB — URINALYSIS, ROUTINE W REFLEX MICROSCOPIC
Bacteria, UA: NONE SEEN
Bilirubin Urine: NEGATIVE
Glucose, UA: NEGATIVE mg/dL
Ketones, ur: 20 mg/dL — AB
Leukocytes, UA: NEGATIVE
Nitrite: NEGATIVE
Protein, ur: NEGATIVE mg/dL
Specific Gravity, Urine: >1.046 — ABNORMAL HIGH (ref 1.005–1.030)
pH: 6 (ref 5.0–8.0)

## 2017-01-17 LAB — CBC WITH DIFFERENTIAL/PLATELET
Basophils Absolute: 0 10*3/uL (ref 0.0–0.1)
Basophils Relative: 0 %
Eosinophils Absolute: 0.9 10*3/uL — ABNORMAL HIGH (ref 0.0–0.7)
Eosinophils Relative: 9 %
HCT: 40.4 % (ref 36.0–46.0)
Hemoglobin: 13.2 g/dL (ref 12.0–15.0)
Lymphocytes Relative: 12 %
Lymphs Abs: 1.2 10*3/uL (ref 0.7–4.0)
MCH: 28.2 pg (ref 26.0–34.0)
MCHC: 32.7 g/dL (ref 30.0–36.0)
MCV: 86.3 fL (ref 78.0–100.0)
Monocytes Absolute: 0.6 10*3/uL (ref 0.1–1.0)
Monocytes Relative: 7 %
Neutro Abs: 6.9 10*3/uL (ref 1.7–7.7)
Neutrophils Relative %: 72 %
Platelets: 236 10*3/uL (ref 150–400)
RBC: 4.68 MIL/uL (ref 3.87–5.11)
RDW: 13.9 % (ref 11.5–15.5)
WBC: 9.7 10*3/uL (ref 4.0–10.5)

## 2017-01-17 LAB — HEPARIN LEVEL (UNFRACTIONATED): HEPARIN UNFRACTIONATED: 0.29 [IU]/mL — AB (ref 0.30–0.70)

## 2017-01-17 LAB — I-STAT TROPONIN, ED: TROPONIN I, POC: 0.08 ng/mL (ref 0.00–0.08)

## 2017-01-17 LAB — MRSA PCR SCREENING: MRSA BY PCR: NEGATIVE

## 2017-01-17 LAB — I-STAT CG4 LACTIC ACID, ED: Lactic Acid, Venous: 1 mmol/L (ref 0.5–1.9)

## 2017-01-17 MED ORDER — KETOROLAC TROMETHAMINE 15 MG/ML IJ SOLN
15.0000 mg | Freq: Four times a day (QID) | INTRAMUSCULAR | Status: AC | PRN
  Administered 2017-01-17 – 2017-01-20 (×8): 15 mg via INTRAVENOUS
  Filled 2017-01-17 (×10): qty 1

## 2017-01-17 MED ORDER — PREDNISONE 20 MG PO TABS
60.0000 mg | ORAL_TABLET | Freq: Once | ORAL | Status: AC
  Administered 2017-01-17: 60 mg via ORAL
  Filled 2017-01-17: qty 3

## 2017-01-17 MED ORDER — ALBUTEROL SULFATE HFA 108 (90 BASE) MCG/ACT IN AERS: 1.0000 | INHALATION_SPRAY | Freq: Four times a day (QID) | RESPIRATORY_TRACT | Status: DC | PRN

## 2017-01-17 MED ORDER — HYDROCODONE-ACETAMINOPHEN 5-325 MG PO TABS
1.0000 | ORAL_TABLET | Freq: Once | ORAL | Status: AC
  Administered 2017-01-17: 1 via ORAL
  Filled 2017-01-17: qty 1

## 2017-01-17 MED ORDER — DEXTROMETHORPHAN POLISTIREX ER 30 MG/5ML PO SUER
15.0000 mg | Freq: Two times a day (BID) | ORAL | Status: DC
  Filled 2017-01-17: qty 5

## 2017-01-17 MED ORDER — ACETAMINOPHEN 325 MG PO TABS
650.0000 mg | ORAL_TABLET | Freq: Four times a day (QID) | ORAL | Status: DC | PRN
  Administered 2017-01-17 – 2017-01-22 (×10): 650 mg via ORAL
  Filled 2017-01-17 (×11): qty 2

## 2017-01-17 MED ORDER — SODIUM CHLORIDE 0.9 % IV SOLN
2500.0000 mg | Freq: Once | INTRAVENOUS | Status: AC
  Administered 2017-01-17: 2500 mg via INTRAVENOUS
  Filled 2017-01-17: qty 2500

## 2017-01-17 MED ORDER — IPRATROPIUM BROMIDE 0.02 % IN SOLN
0.5000 mg | RESPIRATORY_TRACT | Status: AC
  Administered 2017-01-17: 0.5 mg via RESPIRATORY_TRACT
  Filled 2017-01-17: qty 2.5

## 2017-01-17 MED ORDER — POLYETHYLENE GLYCOL 3350 17 G PO PACK
17.0000 g | PACK | Freq: Once | ORAL | Status: AC
Start: 2017-01-17 — End: 2017-01-17
  Administered 2017-01-17: 17 g via ORAL
  Filled 2017-01-17: qty 1

## 2017-01-17 MED ORDER — ALBUTEROL SULFATE (2.5 MG/3ML) 0.083% IN NEBU: 2.5000 mg | INHALATION_SOLUTION | Freq: Four times a day (QID) | RESPIRATORY_TRACT | Status: DC | PRN

## 2017-01-17 MED ORDER — VANCOMYCIN HCL 10 G IV SOLR
1500.0000 mg | Freq: Three times a day (TID) | INTRAVENOUS | Status: DC
  Administered 2017-01-17 – 2017-01-18 (×2): 1500 mg via INTRAVENOUS
  Filled 2017-01-17 (×3): qty 1500

## 2017-01-17 MED ORDER — SENNOSIDES-DOCUSATE SODIUM 8.6-50 MG PO TABS: 1.0000 | ORAL_TABLET | Freq: Every evening | ORAL | Status: DC | PRN

## 2017-01-17 MED ORDER — HEPARIN (PORCINE) IN NACL 100-0.45 UNIT/ML-% IJ SOLN
2100.0000 [IU]/h | INTRAMUSCULAR | Status: DC
  Administered 2017-01-17: 1850 [IU]/h via INTRAVENOUS
  Administered 2017-01-17 (×2): 1700 [IU]/h via INTRAVENOUS
  Filled 2017-01-17 (×6): qty 250

## 2017-01-17 MED ORDER — ALBUTEROL (5 MG/ML) CONTINUOUS INHALATION SOLN
15.0000 mg/h | INHALATION_SOLUTION | RESPIRATORY_TRACT | Status: AC
  Administered 2017-01-17: 15 mg/h via RESPIRATORY_TRACT
  Filled 2017-01-17: qty 20

## 2017-01-17 MED ORDER — IOPAMIDOL (ISOVUE-370) INJECTION 76%
INTRAVENOUS | Status: AC
  Administered 2017-01-17: 100 mL
  Filled 2017-01-17: qty 100

## 2017-01-17 MED ORDER — HEPARIN BOLUS VIA INFUSION
6500.0000 [IU] | Freq: Once | INTRAVENOUS | Status: AC
  Administered 2017-01-17: 6500 [IU] via INTRAVENOUS
  Filled 2017-01-17: qty 6500

## 2017-01-17 MED ORDER — DEXTROSE 5 % IV SOLN
1.0000 g | Freq: Once | INTRAVENOUS | Status: DC
  Filled 2017-01-17: qty 1

## 2017-01-17 MED ORDER — GUAIFENESIN-CODEINE 100-10 MG/5ML PO SOLN
5.0000 mL | Freq: Once | ORAL | Status: AC
  Administered 2017-01-17: 5 mL via ORAL
  Filled 2017-01-17: qty 5

## 2017-01-17 MED ORDER — DEXTROSE 5 % IV SOLN
2.0000 g | Freq: Two times a day (BID) | INTRAVENOUS | Status: DC
  Administered 2017-01-17 – 2017-01-21 (×9): 2 g via INTRAVENOUS
  Filled 2017-01-17 (×9): qty 2

## 2017-01-17 MED ORDER — ACETAMINOPHEN 650 MG RE SUPP: 650.0000 mg | Freq: Four times a day (QID) | RECTAL | Status: DC | PRN

## 2017-01-17 NOTE — ED Triage Notes (Signed)
Pt bib [facility] d/t shob.  Per EMS pt has had pneumonia multiple times since August.  Pt reported to EMS that she has been having coughing fits, everything gets black and then pt wakes up confused.  Pt is on 4L O2 via [place].  O2 sats prior to O2 were in the low 80's.  Pt also c/o chest tightness.

## 2017-01-17 NOTE — Progress Notes (Signed)
[place] for heparin/vancomcin & cefepime Indication: pulmonary embolus & PNA  No Known Allergies  Patient Measurements: Height: 5\' 6"  (167.6 cm) Weight: (!) 329 lb (149.2 kg) IBW/kg (Calculated) : 59.3 Heparin Dosing Weight: 97 kg  Vital Signs: BP: 129/91 (12/09 1145) Pulse Rate: 113 (12/09 1451)  Labs: Recent Labs    01/17/17 0620 01/17/17 1625  HGB 13.2  --   HCT 40.4  --   PLT 236  --   HEPARINUNFRC  --  0.29*  CREATININE 0.62  --     Estimated Creatinine Clearance: 143.4 mL/min (by C-G formula based on SCr of 0.62 mg/dL).  Assessment: 36 yo f presenting with SOB (multiple bouts of PNA since Aug). CTA shows acute PE RHS (RV/LV 1.7), also LLL/LUL PNA  Heparin level this evening is just below goal at 0.29 units/mL. No issues with line, no bleeding noted.  Goal of Therapy:  Heparin level 0.3-0.7 units/ml Monitor platelets by anticoagulation protocol: Yes   Plan:  Increase Heparin infusion to 1850units/hr Heparin level in 6hours Daily Heparin level and CBC  Jalaina Salyers D. Bubber Rothert, PharmD, BCPS Clinical Pharmacist  [phone] 01/17/2017 6:40 PM

## 2017-01-17 NOTE — Progress Notes (Signed)
Dr. Berline Lopes paged for pain meds; Tylenol insufficient to meet patient pain needs - c/o pain 9/10 in back from coughing.  New orders to be received

## 2017-01-17 NOTE — ED Notes (Signed)
Purewick was placed on pt, pt and family informed about procedure, tolerated well.

## 2017-01-17 NOTE — H&P (Signed)
Date: 01/17/2017               Patient Name:  Crystal Gallagher MRN: [id number]  DOB: 1980/06/18 Age / Sex: 36 y.o., female   PCP: Forrest Moron, MD         Medical Service: Internal Medicine Teaching Service         Attending Physician: Dr. Sid Falcon, MD    First Contact: Dr. Berline Lopes Pager: [phone]  Second Contact: Dr. Heber Forest Pager: [phone]       After Hours (After 5p/  First Contact Pager: [phone]  weekends / holidays): Second Contact Pager: [phone]   Chief Complaint: Passing out after coughing   History of Present Illness: Crystal Gallagher is a pleasant 36 year old female who presents today for episodes of syncope following severe bouts of coughing.  She has a past medical history significant for asthma only.  She states that in approximately August she began to experience episodes of coughing and shortness of breath worsening her underlying asthma which was previously well controlled.  She experienced one episode of syncope following a coughing bout in September with the coughing improving after being treated for pneumonia. The patient states that she has been treated for pneumonia 4 times in as many months with Z-Pak's and prednisone.  Each treatment round would moderately improve her symptoms only to have them reoccur a few days later.  She was originally scheduled for bronchoscopy on December 10th which has been canceled due to the now.  In addition, the patient states that 2 days prior, Friday, December 7, she experienced a similar episode as the one in September when she began coughing uncontrollably and awoke on the floor.  She states that upon awakening she was tingling all over, felt lightheaded, cold and clammy. She also endorses mild confusion following these episodes.  Patient states that this occurred again twice on Saturday, December 8th, which ultimately prompted her to visit the ED.  The patient was noted by EMS to have desaturated to the low 80s without oxygen and  was complaining of chest tightness as well.  She denied use of oral contraceptives in the past or present, stating instead that she is on the Mirena device.   She denies headache, nausea, diarrhea, chills, fever, or abdominal pain. The patient attested to pain in her left shoulder, pain in her chest is midsternal/left sided. IN addition she attested to constipation times 2 weeks with one bowel movement and this time, and generally feeling unwell.  In the ED patient was placed on oxygen at 4 L which increased her O2 sats to 96%.  In addition chest x-ray revealed worsening opacity of the left lower lung consistent with airspace consolidation, small left-sided effusion but no evidence of pneumothorax compared to the image 11/26 this is significantly worse.  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  In addition there is bilateral hilar and sub-carinal lymphadenopathy.  CBC indicated white blood cell count of 9.7, H&H 13.2/40.4 and stable platelets of 236.  BMP unremarkable sodium 136, potassium 3.6, creatinine 0.62.  Lactic acid was 1.04.  Meds:  Current Meds  Medication Sig  . Acetaminophen-Aspirin Buffered (EXCEDRIN BACK & BODY) 250-250 MG tablet Take 1 tablet by mouth every 4 (four) hours as needed for pain.  Marland Kitchen  albuterol (PROVENTIL HFA;VENTOLIN HFA) 108 (90 Base) MCG/ACT inhaler Inhale 1-2 puffs into the lungs every 6 (six) hours as needed for wheezing.  Marland Kitchen albuterol (PROVENTIL) (5 MG/ML) 0.5% nebulizer solution Take 0.5 mLs (2.5 mg total) by nebulization every 6 (six) hours as needed for wheezing or shortness of breath.  . guaifenesin (ROBITUSSIN) 100 MG/5ML syrup Take 200 mg by mouth 3 (three) times daily as needed for cough.  Marland Kitchen HYDROcodone-homatropine  (HYCODAN) 5-1.5 MG/5ML syrup Take 5 mLs by mouth every 6 (six) hours as needed for cough.  Marland Kitchen levonorgestrel (MIRENA) 20 MCG/24HR IUD 1 each by Intrauterine route once.  . Lidocaine 4 % PTCH Apply 1 patch topically every 12 (twelve) hours as needed. (Patient taking differently: Apply 1 patch topically every 12 (twelve) hours as needed (for pain.). )  . omeprazole (PRILOSEC) 20 MG capsule Take 1 capsule (20 mg total) by mouth daily. (Patient taking differently: Take 20 mg by mouth 2 (two) times daily before a meal. )   Allergies: Allergies as of 01/17/2017  . (No Known Allergies)   Past Medical History:  Diagnosis Date  . Asthma   . Pneumonia    Family History:  Father deceased age 52, Colon cancer, diagnosised at 13 Mother, none released by patient Extensive distant family history of breast and colon cancer in multiple aunts and uncles. Was nonspecific when questioned for additional details regarding her relationship to whom.     Social History:  Denied tobacco/EtOH/illicit drugs Lives with her mother since her father passed  Review of Systems: A complete ROS was negative except as per HPI.   Physical Exam: Blood pressure (!) 129/91, pulse (!) 114, temperature 99.4 F (37.4 C), temperature source Oral, resp. rate (!) 29, height 5\' 6"  (1.676 m), weight (!) 329 lb (149.2 kg), SpO2 98 %. Physical Exam  Constitutional: She is oriented to person, place, and time. She appears well-developed and well-nourished. She does not appear ill. She appears distressed (Significant work of breathing noted).  Cardiovascular: Regular rhythm and intact distal pulses. Tachycardia present.  Pulmonary/Chest: Accessory muscle usage present. Tachypnea noted. She has decreased breath sounds in the right lower field, the left middle field and the left lower field. She has rhonchi in the left lower field.  Abdominal: Soft. Bowel sounds are normal. She exhibits no distension.  Musculoskeletal:       Right lower  leg: She exhibits no tenderness and no edema.       Left lower leg: She exhibits no tenderness and no edema.  Neurological: She is alert and oriented to person, place, and time.  Skin: Capillary refill takes less than 2 seconds.  Psychiatric: Her mood appears anxious.  Nursing note and vitals reviewed.  EKG: personally reviewed my interpretation is sinus tachycardia without S-waves in lead I, but with Q-waves in lead III, and T-wave inversion in lead III-  CXR: personally reviewed my interpretation is the left lower lobe lung consolidation/infiltrate when compared to 11/26.   Assessment & Plan by Problem: Active Problems:   CAP (community acquired pneumonia)   Asthma   Pulmonary embolism ([place])  Dru Strauss is a pleasant 36 year old female who presents today for episodes of syncope following severe bouts of coughing.  Given the CT angiogram findings patient is mostly suffering from pulmonary embolism secondary to her hypercoagulable state of unknown etiology.  Also considered pneumonia, MI, pneumothorax, neoplasm.  Given her extensive family history of neoplasm including colon and breast cancer evaluation for a source of his PE is  warranted. There is no clear evidence of RBBB on EKG and no S-wave in lead I, but lead III demonstrated Q-wave and inverted T-wave in lead III. BP is WNL's but soft, tachycardic and tachypneic with mild pleuritic chest pain, RV/LV ratio 1.7, and dyspnea indicating a moderately severe PE.   Pulmonary embolism: Patient is CTA evidence PE, dyspnea at rest, requiring 4 L of oxygen to maintain sats greater than 97%, tachypneic, tachycardic.  However, blood pressure is maintaining ~144/88 when in the room but down to 101/81 at this time. If patient declines PCCM consult will need to be made stat. -Heparin consulted to pharmacy for dosing initiated -Placed on high flow nasal cannula given desaturation to 92% with sitting up as well as her tachypnea and tachycardia with soft  blood pressure. -Low threshold to consult PCCM if the patient fails to improve or clinically worsens  CAP: Patient is afebrile, but tachycardic tachypneic and maintaining her blood pressure with a leukocytosis or increased lactic acid.  However, she has CT evidence is consistent with possible pneumonia is failed multiple treatments in the past for azithromycin. -We will treat the patient for possible pneumonia -Vancomycin per pharmacy consult -Cefepime as per pharmacy consult -CBC in am  Asthma: Patient has a documented history of asthma. -PRN albuterol inhaler via nebulizer or propelled inhalant as requested.  Code: Full Diet: Regular when tolerated Fluids: None at this time DVT PPX: Heparin Dispo: Admit patient to Inpatient with expected length of stay greater than 2 midnights.  Signed: Kathi Ludwig, MD 01/17/2017, 1:00 PM  Pager: Pager# [phone]

## 2017-01-17 NOTE — Progress Notes (Signed)
Went to see patient for concern of increasing pain. She stated that it had improved with the pain medication given. She denied feeling lightheaded or worsening dyspnea.   She was resting much more comfortably in her bed on her right wide watching TV. RR 24 but remaining tachycardic to 111. BP still WNL's.  Kathi Ludwig, MD

## 2017-01-17 NOTE — ED Provider Notes (Signed)
[facility] [facility] PROGRESSIVE CARE Provider Note   CSN: [id number] Arrival date & time: 01/17/17  1093     History   Chief Complaint Chief Complaint  Patient presents with  . Shortness of Breath    HPI Crystal Gallagher is a 36 y.o. female.  HPI Patient presents to the emergency department with worsening shortness of breath this morning.  The patient has been having worsening breathing issues over the last few months.  The patient states that today she felt like she got worse this morning.  She states that she has been hospitalized several times for recurrent pneumonias.  The patient states that she is due to have a bronchoscopy tomorrow but they canceled it due to the weather.  The patient states that she did not take any medications prior to arrival for her symptoms.  The patient denies chest pain,  headache,blurred vision, neck pain, fever, cough, weakness, numbness, dizziness, anorexia, edema, abdominal pain, nausea, vomiting, diarrhea, rash, back pain, dysuria, hematemesis, bloody stool, near syncope, or syncope. Past Medical History:  Diagnosis Date  . Asthma   . Pneumonia     Patient Active Problem List   Diagnosis Date Noted  . Pulmonary embolism ([place]) 01/17/2017  . Chronic cough 01/12/2017  . CAP (community acquired pneumonia) 12/05/2016  . Acute respiratory failure with hypoxia ([place]) 12/05/2016  . Asthma 12/05/2016  . Morbid obesity with BMI of 45.0-49.9, adult ([place]) 12/05/2016  . Pleuritic chest pain 12/05/2016    Past Surgical History:  Procedure Laterality Date  . APPENDECTOMY    . BREAST SURGERY  breast reduction    OB History    No data available       Home Medications    Prior to Admission medications   Medication Sig Start Date End Date Taking? Authorizing Provider  Acetaminophen-Aspirin Buffered (EXCEDRIN BACK & BODY) 250-250 MG tablet Take 1 tablet by mouth every 4 (four) hours as needed for pain.   Yes [provider]  albuterol  (PROVENTIL HFA;VENTOLIN HFA) 108 (90 Base) MCG/ACT inhaler Inhale 1-2 puffs into the lungs every 6 (six) hours as needed for wheezing. 10/30/16  Yes Tanna Furry, MD  albuterol (PROVENTIL) (5 MG/ML) 0.5% nebulizer solution Take 0.5 mLs (2.5 mg total) by nebulization every 6 (six) hours as needed for wheezing or shortness of breath. 11/10/16  Yes Hedges, Dellis Filbert, PA-C  guaifenesin (ROBITUSSIN) 100 MG/5ML syrup Take 200 mg by mouth 3 (three) times daily as needed for cough.   Yes [provider]  HYDROcodone-homatropine (HYCODAN) 5-1.5 MG/5ML syrup Take 5 mLs by mouth every 6 (six) hours as needed for cough. 01/12/17  Yes Collene Gobble, MD  levonorgestrel (MIRENA) 20 MCG/24HR IUD 1 each by Intrauterine route once.   Yes [provider]  Lidocaine 4 % PTCH Apply 1 patch topically every 12 (twelve) hours as needed. Patient taking differently: Apply 1 patch topically every 12 (twelve) hours as needed (for pain.).  01/05/17  Yes Stallings, Zoe A, MD  omeprazole (PRILOSEC) 20 MG capsule Take 1 capsule (20 mg total) by mouth daily. Patient taking differently: Take 20 mg by mouth 2 (two) times daily before a meal.  01/04/17  Yes Forrest Moron, MD    Family History Family History  Problem Relation Age of Onset  . Cancer Other   . Hypertension Other   . Arthritis Mother   . Colon cancer Father   . Breast cancer Maternal Aunt   . Colon cancer Paternal Uncle   .  Stroke Maternal Grandmother   . Cancer Maternal Grandfather   . Hypertension Maternal Grandfather   . Alzheimer's disease Paternal Grandmother   . Breast cancer Maternal Aunt     Social History Social History   Tobacco Use  . Smoking status: Never Smoker  . Smokeless tobacco: Never Used  Substance Use Topics  . Alcohol use: No    Comment: occasionally  . Drug use: No     Allergies   Patient has no known allergies.   Review of Systems Review of Systems  All other systems negative except as documented in the  HPI. All pertinent positives and negatives as reviewed in the HPI. Physical Exam Updated Vital Signs BP (!) 129/91   Pulse (!) 114   Temp 99.4 F (37.4 C) (Oral)   Resp (!) 29   Ht 5\' 6"  (1.676 m)   Wt (!) 149.2 kg (329 lb)   SpO2 98%   BMI 53.10 kg/m   Physical Exam  Constitutional: She is oriented to person, place, and time. She appears well-developed and well-nourished. No distress.  HENT:  Head: Normocephalic and atraumatic.  Mouth/Throat: Oropharynx is clear and moist.  Eyes: Pupils are equal, round, and reactive to light.  Neck: Normal range of motion. Neck supple.  Cardiovascular: Normal rate, regular rhythm and normal heart sounds. Exam reveals no gallop and no friction rub.  No murmur heard. Pulmonary/Chest: Effort normal. No respiratory distress. She has no decreased breath sounds. She has wheezes. She has no rhonchi. She has no rales.  Abdominal: Soft. Bowel sounds are normal. She exhibits no distension. There is no tenderness.  Neurological: She is alert and oriented to person, place, and time. She exhibits normal muscle tone. Coordination normal.  Skin: Skin is warm and dry. Capillary refill takes less than 2 seconds. No rash noted. No erythema.  Psychiatric: She has a normal mood and affect. Her behavior is normal.  Nursing note and vitals reviewed.    ED Treatments / Results  Labs (all labs ordered are listed, but only abnormal results are displayed) Labs Reviewed  BASIC METABOLIC PANEL - Abnormal; Notable for the following components:      Result Value   CO2 18 (*)    All other components within normal limits  CBC WITH DIFFERENTIAL/PLATELET - Abnormal; Notable for the following components:   Eosinophils Absolute 0.9 (*)    All other components within normal limits  URINALYSIS, ROUTINE W REFLEX MICROSCOPIC - Abnormal; Notable for the following components:   Specific Gravity, Urine >1.046 (*)    Hgb urine dipstick LARGE (*)    Ketones, ur 20 (*)     Squamous Epithelial / LPF 0-5 (*)    All other components within normal limits  CULTURE, BLOOD (ROUTINE X 2)  CULTURE, BLOOD (ROUTINE X 2)  MRSA PCR SCREENING  HEPARIN LEVEL (UNFRACTIONATED)  I-STAT CG4 LACTIC ACID, ED  I-STAT TROPONIN, ED    EKG  EKG Interpretation None       Radiology Ct Angio Chest Pe W/cm &/or Wo Cm  Result Date: 01/17/2017 CLINICAL DATA:  Shortness of breath and chest pain for the last several months. EXAM: CT ANGIOGRAPHY CHEST WITH CONTRAST TECHNIQUE: Multidetector CT imaging of the chest was performed using the standard protocol during bolus administration of intravenous contrast. Multiplanar CT image reconstructions and MIPs were obtained to evaluate the vascular anatomy. CONTRAST:  152mL ISOVUE-370 IOPAMIDOL (ISOVUE-370) INJECTION 76% COMPARISON:  Radiography same day.  CT 12/05/2016 FINDINGS: Cardiovascular: Pulmonary arterial opacification is good.  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 No visible emboli on the left. No aortic disease. No coronary artery calcification. Maximum right ventricular diameter is 4.3 cm. Maximal left ventricular diameter is 2.5 cm. This suggests right ventricular strain. Additionally, the patient appears to have left ventricular muscular hypertrophy. Mediastinum/Nodes: Enlarged subcarinal and bilateral hilar lymph nodes. Lungs/Pleura: Right lung is clear. Left lung shows complete consolidation of the lower lobe with collapse. Areas of patchy pneumonia present throughout the left upper lobe. Upper Abdomen: Negative Musculoskeletal: Negative Review of the MIP images confirms the above findings. 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 Evidence of right ventricular strain. Positive for acute PE with CT evidence of right heart strain (RV/LV Ratio = 1.7) consistent with at least submassive (intermediate risk) PE. The presence of right heart strain has been  associated with an increased risk of morbidity and mortality. Please activate Code PE by paging [phone]. Worsened consolidation and collapse throughout the left lower lobe. Patchy pneumonia present throughout the left upper lobe. Bilateral hilar and subcarinal lymphadenopathy, presumably reactive. Cannot rule out the possibility of underlying process such as sarcoid. Evidence of left ventricular hypertrophy. Electronically Signed   By: Nelson Chimes M.D.   On: 01/17/2017 09:24   Dg Chest Port 1 View  Result Date: 01/17/2017 CLINICAL DATA:  36 year old female with shortness of breath. EXAM: PORTABLE CHEST 1 VIEW COMPARISON:  01/04/2017 FINDINGS: Cardiomediastinal silhouette is partially obscured but grossly unchanged. There is worsening opacity at the left lower lobe consistent with airspace consolidation. The right lung is clear. Small left-sided effusion not excluded. No definite pneumothorax. No acute osseous abnormalities. IMPRESSION: Worsening left lower lobe consolidation. Continued follow-up recommended. Electronically Signed   By: Kristopher Oppenheim M.D.   On: 01/17/2017 06:59    Procedures Procedures (including critical care time)  Medications Ordered in ED Medications  albuterol (PROVENTIL,VENTOLIN) solution continuous neb (0 mg/hr Nebulization Stopped 01/17/17 0828)  ceFEPIme (MAXIPIME) 2 g in dextrose 5 % 50 mL IVPB (0 g Intravenous Stopped 01/17/17 1114)  vancomycin (VANCOCIN) 1,500 mg in sodium chloride 0.9 % 500 mL IVPB (not administered)  heparin ADULT infusion 100 units/mL (25000 units/265mL sodium chloride 0.45%) (1,700 Units/hr Intravenous Transfusing/Transfer 01/17/17 1216)  acetaminophen (TYLENOL) tablet 650 mg (650 mg Oral Given 01/17/17 1334)    Or  acetaminophen (TYLENOL) suppository 650 mg ( Rectal See Alternative 01/17/17 1334)  senna-docusate (Senokot-S) tablet 1 tablet (not administered)  albuterol (PROVENTIL) (2.5 MG/3ML) 0.083% nebulizer solution 2.5 mg (not administered)    HYDROcodone-acetaminophen (NORCO/VICODIN) 5-325 MG per tablet 1 tablet (not administered)  guaiFENesin-codeine 100-10 MG/5ML solution 5 mL (not administered)  ipratropium (ATROVENT) nebulizer solution 0.5 mg (0.5 mg Nebulization Given 01/17/17 0702)  predniSONE (DELTASONE) tablet 60 mg (60 mg Oral Given 01/17/17 0642)  iopamidol (ISOVUE-370) 76 % injection (100 mLs  Contrast Given 01/17/17 0848)  HYDROcodone-acetaminophen (NORCO/VICODIN) 5-325 MG per tablet 1 tablet (1 tablet Oral Given 01/17/17 0744)  vancomycin (VANCOCIN) 2,500 mg in sodium chloride 0.9 % 500 mL IVPB (2,500 mg Intravenous Transfusing/Transfer 01/17/17 1216)  heparin bolus via infusion 6,500 Units (6,500 Units Intravenous Bolus from Bag 01/17/17 1046)     Initial Impression / Assessment and Plan / ED Course  I have reviewed the triage vital signs and the nursing notes.  Pertinent labs & imaging results that were available during my care of the patient were reviewed by me and considered in my medical decision making (see chart for details).  I had concern due to her tachycardia that she could have a pulmonary embolus due to her decreased abilities over the last few months and the fact that she was short of breath as well she does have diffuse wheezing and this large left lower lobe infiltrate the patient did have PE noted on CTA.  I spoke with the resident about the patient on heparin.  She does have signs of heart strain.  CRITICAL CARE Performed by: Resa Miner Nikisha Fleece Total critical care time:35 minutes Critical care time was exclusive of separately billable procedures and treating other patients. Critical care was necessary to treat or prevent imminent or life-threatening deterioration. Critical care was time spent personally by me on the following activities: development of treatment plan with patient and/or surrogate as well as nursing, discussions with consultants, evaluation of patient's response to treatment,  examination of patient, obtaining history from patient or surrogate, ordering and performing treatments and interventions, ordering and review of laboratory studies, ordering and review of radiographic studies, pulse oximetry and re-evaluation of patient's condition.   Final Clinical Impressions(s) / ED Diagnoses   Final diagnoses:  Pulmonary embolism on right [facility])  HCAP (healthcare-associated pneumonia)    ED Discharge Orders    None       Dalia Heading, PA-C 01/17/17 1455    Drenda Freeze, MD 01/18/17 [phone]

## 2017-01-17 NOTE — ED Notes (Signed)
Pt sleeping at present

## 2017-01-17 NOTE — Progress Notes (Signed)
[facility] for heparin/vancomcin & cefepime Indication: pulmonary embolus & PNA  No Known Allergies  Patient Measurements: Height: 5\' 6"  (167.6 cm) Weight: (!) 329 lb (149.2 kg) IBW/kg (Calculated) : 59.3 Heparin Dosing Weight: 97 kg  Vital Signs: Temp: 99.4 F (37.4 C) (12/09 0615) Temp Source: Oral (12/09 0615) BP: 132/87 (12/09 0815) Pulse Rate: 132 (12/09 0815)  Labs: Recent Labs    01/17/17 0620  HGB 13.2  HCT 40.4  PLT 236  CREATININE 0.62    Estimated Creatinine Clearance: 143.4 mL/min (by C-G formula based on SCr of 0.62 mg/dL).  Assessment: CC/HPI: 36 yo f presenting with SOB (multiple bouts of PNA since aug)  CTA shows acute PE RHS (RV/LV 1.7), also LLL/LUL PNA  PMH: asthma  Anticoag: none pta - iv hep for acute PE ID: abx for PNA. WBC 9.7, AF  Vancomycin 12/9>> Cefepime 12/9>>  Renal: SCr 0.62  Heme/Onc: H&H 13.2/40.4, Plt 236  Goal of Therapy:  Heparin level 0.3-0.7 units/ml Monitor platelets by anticoagulation protocol: Yes   Plan:  Cefepime 2 g q12h Vancomycin 2500 mg x 1 then 1500 mg q8h Heparin bolus 6500 units x 1 Heparin infusion 1700 units/hr Initial HL 1700 Daily HL CBC  Monitor renal fx cx vt prn  Levester Fresh, PharmD, BCPS, BCCCP Clinical Pharmacist Clinical phone for 01/17/2017 from 7a-3:30p: [phone] If after 3:30p, please call main pharmacy at: [phone] 01/17/2017 9:55 AM

## 2017-01-17 NOTE — ED Notes (Signed)
Pt c/o sciatica pain -- states excedrin at home for pain.

## 2017-01-18 ENCOUNTER — Ambulatory Visit (HOSPITAL_COMMUNITY): Admission: RE | Admit: 2017-01-18 | Payer: 59 | Source: Ambulatory Visit | Admitting: Emergency Medicine

## 2017-01-18 ENCOUNTER — Inpatient Hospital Stay (HOSPITAL_COMMUNITY): Payer: 59

## 2017-01-18 ENCOUNTER — Inpatient Hospital Stay (HOSPITAL_COMMUNITY)
Admission: RE | Admit: 2017-01-18 | Discharge: 2017-01-18 | Disposition: A | Discharge status: Home / Self Care | Payer: 59 | Source: Ambulatory Visit

## 2017-01-18 ENCOUNTER — Encounter (HOSPITAL_COMMUNITY)
Admission: EM | Disposition: A | Discharge status: Home / Self Care | Payer: Self-pay | Source: Home / Self Care | Attending: Internal Medicine

## 2017-01-18 DIAGNOSIS — Z975 Presence of (intrauterine) contraceptive device: Secondary | ICD-10-CM

## 2017-01-18 DIAGNOSIS — I2609 Other pulmonary embolism with acute cor pulmonale: Secondary | ICD-10-CM

## 2017-01-18 DIAGNOSIS — I340 Nonrheumatic mitral (valve) insufficiency: Secondary | ICD-10-CM

## 2017-01-18 DIAGNOSIS — J189 Pneumonia, unspecified organism: Secondary | ICD-10-CM

## 2017-01-18 DIAGNOSIS — J45909 Unspecified asthma, uncomplicated: Secondary | ICD-10-CM

## 2017-01-18 DIAGNOSIS — N939 Abnormal uterine and vaginal bleeding, unspecified: Secondary | ICD-10-CM

## 2017-01-18 DIAGNOSIS — I2699 Other pulmonary embolism without acute cor pulmonale: Secondary | ICD-10-CM

## 2017-01-18 DIAGNOSIS — R000 Tachycardia, unspecified: Secondary | ICD-10-CM

## 2017-01-18 LAB — BASIC METABOLIC PANEL
ANION GAP: 10 (ref 5–15)
BUN: 9 mg/dL (ref 6–20)
CALCIUM: 9 mg/dL (ref 8.9–10.3)
CO2: 22 mmol/L (ref 22–32)
Chloride: 106 mmol/L (ref 101–111)
Creatinine, Ser: 0.67 mg/dL (ref 0.44–1.00)
GLUCOSE: 101 mg/dL — AB (ref 65–99)
POTASSIUM: 3.4 mmol/L — AB (ref 3.5–5.1)
Sodium: 138 mmol/L (ref 135–145)

## 2017-01-18 LAB — ECHOCARDIOGRAM COMPLETE
AOASC: 29 cm
CHL CUP MV DEC (S): 204
CHL CUP TV REG PEAK VELOCITY: 255 cm/s
E decel time: 204 msec
FS: 28 % (ref 28–44)
HEIGHTINCHES: 66 in
IV/PV OW: 1
LA diam index: 1.29 cm/m2
LASIZE: 35 mm
LAVOLA4C: 47.2 mL
LEFT ATRIUM END SYS DIAM: 35 mm
LV PW d: 9 mm — AB (ref 0.6–1.1)
LV e' LATERAL: 15.7 cm/s
LVOT area: 3.46 cm2
LVOT diameter: 21 mm
MVPKEVEL: 1.3 m/s
TAPSE: 24.2 mm
TDI e' lateral: 15.7
TDI e' medial: 9.57
TRMAXVEL: 255 cm/s
WEIGHTICAEL: 5248.71 [oz_av]

## 2017-01-18 LAB — CBC
HCT: 37.6 % (ref 36.0–46.0)
Hemoglobin: 12.2 g/dL (ref 12.0–15.0)
MCH: 28.5 pg (ref 26.0–34.0)
MCHC: 32.4 g/dL (ref 30.0–36.0)
MCV: 87.9 fL (ref 78.0–100.0)
Platelets: 209 10*3/uL (ref 150–400)
RBC: 4.28 MIL/uL (ref 3.87–5.11)
RDW: 14.6 % (ref 11.5–15.5)
WBC: 11.1 10*3/uL — ABNORMAL HIGH (ref 4.0–10.5)

## 2017-01-18 LAB — HEPARIN LEVEL (UNFRACTIONATED)
HEPARIN UNFRACTIONATED: 0.33 [IU]/mL (ref 0.30–0.70)
HEPARIN UNFRACTIONATED: 0.22 [IU]/mL — AB (ref 0.30–0.70)
Heparin Unfractionated: 0.28 IU/mL — ABNORMAL LOW (ref 0.30–0.70)

## 2017-01-18 LAB — GLUCOSE, CAPILLARY: GLUCOSE-CAPILLARY: 81 mg/dL (ref 65–99)

## 2017-01-18 SURGERY — BRONCHOSCOPY, WITH FLUOROSCOPY
Anesthesia: Moderate Sedation | Laterality: Bilateral

## 2017-01-18 MED ORDER — GUAIFENESIN-CODEINE 100-10 MG/5ML PO SOLN
5.0000 mL | Freq: Four times a day (QID) | ORAL | Status: DC | PRN
  Administered 2017-01-18 – 2017-01-19 (×3): 5 mL via ORAL
  Filled 2017-01-18 (×3): qty 5

## 2017-01-18 MED ORDER — POTASSIUM CHLORIDE CRYS ER 20 MEQ PO TBCR
20.0000 meq | EXTENDED_RELEASE_TABLET | Freq: Once | ORAL | Status: AC
  Administered 2017-01-18: 20 meq via ORAL
  Filled 2017-01-18: qty 1

## 2017-01-18 MED ORDER — LACTULOSE 10 GM/15ML PO SOLN
10.0000 g | Freq: Once | ORAL | Status: AC
  Administered 2017-01-18: 10 g via ORAL
  Filled 2017-01-18: qty 15

## 2017-01-18 MED ORDER — HEPARIN (PORCINE) IN NACL 100-0.45 UNIT/ML-% IJ SOLN
2350.0000 [IU]/h | INTRAMUSCULAR | Status: AC
  Administered 2017-01-18 – 2017-01-21 (×4): 2350 [IU]/h via INTRAVENOUS
  Filled 2017-01-18 (×6): qty 250

## 2017-01-18 MED ORDER — HEPARIN BOLUS VIA INFUSION
2000.0000 [IU] | Freq: Once | INTRAVENOUS | Status: AC
  Administered 2017-01-18: 2000 [IU] via INTRAVENOUS
  Filled 2017-01-18: qty 2000

## 2017-01-18 MED ORDER — GUAIFENESIN 100 MG/5ML PO SOLN
5.0000 mL | Freq: Four times a day (QID) | ORAL | Status: DC | PRN
  Administered 2017-01-18: 100 mg via ORAL
  Filled 2017-01-18: qty 5

## 2017-01-18 MED ORDER — POTASSIUM CHLORIDE CRYS ER 20 MEQ PO TBCR
40.0000 meq | EXTENDED_RELEASE_TABLET | Freq: Once | ORAL | Status: AC
  Administered 2017-01-19: 40 meq via ORAL
  Filled 2017-01-18: qty 2

## 2017-01-18 NOTE — Progress Notes (Signed)
  Echocardiogram 2D Echocardiogram has been performed.  Crystal Gallagher 01/18/2017, 2:33 PM

## 2017-01-18 NOTE — Progress Notes (Signed)
ANTICOAGULATION CONSULT NOTE - FOLLOW UP    HL = 0.22 (goal 0.3 - 0.7 units/mL) Heparin dosing weight = 97 kg   Assessment: 36 YOF with extensive PE to continue on IV heparin.  Doppler negative for DVT.  Heparin level trended down and is sub-therapeutic.  RN reported no issue with heparin infusion.  No bleeding documented.   Plan: Increase heparin gtt to 2350 units/hr Check 6 hr heparin level    Deniz Eskridge D. Mina Marble, PharmD, BCPS 01/18/2017, 5:35 PM

## 2017-01-18 NOTE — Progress Notes (Signed)
  Date: 01/18/2017  Patient name: Crystal Gallagher  Medical record number: [id number]  Date of birth: Jul 10, 1980   I have seen and evaluated this patient and I have discussed the plan of care with the house staff. Please see Dr. Nelma Rothman note for complete details. I concur with his findings.   Reviewed Dr. Bari Mantis note and will follow recommendations.    Sid Falcon, MD 01/18/2017, 2:13 PM

## 2017-01-18 NOTE — Progress Notes (Signed)
ANTICOAGULATION CONSULT NOTE - Follow Up Consult  Pharmacy Consult for Heparin  Indication: pulmonary embolus  No Known Allergies  Patient Measurements: Height: 5\' 6"  (167.6 cm) Weight: (!) 328 lb 0.7 oz (148.8 kg) IBW/kg (Calculated) : 59.3  Vital Signs: Temp: 98.9 F (37.2 C) (12/10 0846) Temp Source: Oral (12/10 0846) BP: 131/88 (12/10 0846) Pulse Rate: 88 (12/10 0317)  Labs: Recent Labs    01/17/17 0620 01/17/17 1625 01/18/17 0028 01/18/17 0947  HGB 13.2  --  12.2  --   HCT 40.4  --  37.6  --   PLT 236  --  209  --   HEPARINUNFRC  --  0.29* 0.28* 0.33  CREATININE 0.62  --  0.67  --     Estimated Creatinine Clearance: 143.1 mL/min (by C-G formula based on SCr of 0.67 mg/dL).   Assessment: 36 y/o with extensive PE and heparin level at goal after heparin bolus and increase. CCM seeing for possible thrombolysis.  -heparin level now on the low end of goal  Goal of Therapy:  Heparin level 0.3-0.7 units/ml Monitor platelets by anticoagulation protocol: Yes   Plan:  -Increase heparin to 2100 units/hr -Heparin level in 6 hours and daily wth CBC daily  Hildred Laser, Pharm D 01/18/2017 10:42 AM

## 2017-01-18 NOTE — Progress Notes (Signed)
ANTICOAGULATION CONSULT NOTE - Follow Up Consult  Pharmacy Consult for Heparin  Indication: pulmonary embolus  No Known Allergies  Patient Measurements: Height: 5\' 6"  (167.6 cm) Weight: (!) 330 lb 4 oz (149.8 kg) IBW/kg (Calculated) : 59.3  Vital Signs: Temp: 98.9 F (37.2 C) (12/09 2259) Temp Source: Oral (12/09 2259) BP: 132/79 (12/09 2259) Pulse Rate: 93 (12/09 2259)  Labs: Recent Labs    01/17/17 0620 01/17/17 1625 01/18/17 0028  HGB 13.2  --  12.2  HCT 40.4  --  37.6  PLT 236  --  209  HEPARINUNFRC  --  0.29* 0.28*  CREATININE 0.62  --  0.67    Estimated Creatinine Clearance: 143.7 mL/min (by C-G formula based on SCr of 0.67 mg/dL).   Assessment: 36 y/o with extensive PE, heparin level remains low, no issues noted with infusion, RN did inform me that patient appears to have started menstrual cycle>>may expect some heavier bleeding than normal from that  Goal of Therapy:  Heparin level 0.3-0.7 units/ml Monitor platelets by anticoagulation protocol: Yes   Plan:  Heparin 2000 units [employer] heparin to 2000 units/hr 0900 HL Trend Hgb  Narda Bonds 01/18/2017,1:38 AM

## 2017-01-18 NOTE — Consult Note (Signed)
Name: Crystal Gallagher MRN: [id number] DOB: 06/24/1980    ADMISSION DATE:  01/17/2017 CONSULTATION DATE:  12/10  REFERRING MD :  Dr. Thayer Headings  CHIEF COMPLAINT:  PE  HISTORY OF PRESENT ILLNESS:  36 year old female with PMH significant for Asthma. She is currently being worked up in pulmonary clinic for chronic cough and chronic LLL opacification. She was scheduled for bronchoscopy with Dr Lamonte Sakai 12/10, but 12/9 her cough worsened and she developed dyspnea. She had several syncopal episodes before presenting to [facility] ED 12/9 where she had CT angiogram done demonstrating pulmonary embolism. She was admitted to IMTS and started on heparin infusion.  After heparin started she did have some vaginal bleeding, which she reports is not consistent with her menses. RV/LV ratio 1.7 prompting PCCM consult for consideration of some for of thrombolytic therapy.   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 Evidence of right ventricular strain.  (RV/LV Ratio = 1.7) Echo 12/10 > Venous doppler 12/10 >   PAST MEDICAL HISTORY :   has a past medical history of Asthma and Pneumonia.  has a past surgical history that includes Appendectomy and Breast surgery (breast reduction). Prior to Admission medications   Medication Sig Start Date End Date Taking? Authorizing Provider  Acetaminophen-Aspirin Buffered (EXCEDRIN BACK & BODY) 250-250 MG tablet Take 1 tablet by mouth every 4 (four) hours as needed for pain.   Yes [provider]  albuterol (PROVENTIL HFA;VENTOLIN HFA) 108 (90 Base) MCG/ACT inhaler Inhale 1-2 puffs into the lungs every 6 (six) hours as needed for wheezing. 10/30/16  Yes Tanna Furry, MD  albuterol (PROVENTIL) (5 MG/ML) 0.5% nebulizer solution Take 0.5 mLs (2.5 mg total) by nebulization every 6 (six) hours as needed for wheezing or shortness of breath. 11/10/16  Yes Hedges, Dellis Filbert,  PA-C  guaifenesin (ROBITUSSIN) 100 MG/5ML syrup Take 200 mg by mouth 3 (three) times daily as needed for cough.   Yes [provider]  HYDROcodone-homatropine (HYCODAN) 5-1.5 MG/5ML syrup Take 5 mLs by mouth every 6 (six) hours as needed for cough. 01/12/17  Yes Collene Gobble, MD  levonorgestrel (MIRENA) 20 MCG/24HR IUD 1 each by Intrauterine route once.   Yes [provider]  Lidocaine 4 % PTCH Apply 1 patch topically every 12 (twelve) hours as needed. Patient taking differently: Apply 1 patch topically every 12 (twelve) hours as needed (for pain.).  01/05/17  Yes Stallings, Zoe A, MD  omeprazole (PRILOSEC) 20 MG capsule Take 1 capsule (20 mg total) by mouth daily. Patient taking differently: Take 20 mg by mouth 2 (two) times daily before a meal.  01/04/17  Yes Stallings, Zoe A, MD   No Known Allergies  FAMILY HISTORY:  family history includes Alzheimer's disease in her paternal grandmother; Arthritis in her mother; Breast cancer in her maternal aunt and maternal aunt; Cancer in her maternal grandfather and other; Colon cancer in her father and paternal uncle; Hypertension in her maternal grandfather and other; Stroke in her maternal grandmother. SOCIAL HISTORY:  reports that  has never smoked. she has never used smokeless tobacco. She reports that she does not drink alcohol or use drugs.  REVIEW OF SYSTEMS:   Bolds are positive  Constitutional: weight loss, gain, night sweats, Fevers, chills, fatigue .  HEENT: headaches, Sore throat, sneezing, nasal congestion, post nasal drip, Difficulty swallowing, Tooth/dental problems, visual complaints visual changes, ear ache CV:  chest pain, radiates:,Orthopnea, PND, swelling in lower extremities, dizziness, palpitations, syncope.  GI  heartburn, indigestion, abdominal pain, nausea, vomiting, diarrhea, change in bowel habits, loss of appetite, bloody stools.  Resp: cough, productive:, hemoptysis, dyspnea, chest pain, pleuritic.    Skin: rash or itching or icterus GU: dysuria, change in color of urine, urgency or frequency. flank pain, hematuria  MS: joint pain or swelling. decreased range of motion "knot" in R leg/thigh Psych: change in mood or affect. depression or anxiety.  Neuro: difficulty with speech, weakness, numbness, ataxia   SUBJECTIVE:   VITAL SIGNS: Temp:  [98.5 F (36.9 C)-99.7 F (37.6 C)] 98.9 F (37.2 C) (12/10 0846) Pulse Rate:  [88-131] 88 (12/10 0317) Resp:  [10-32] 28 (12/10 0317) BP: (101-149)/(56-98) 131/88 (12/10 0846) SpO2:  [91 %-99 %] 97 % (12/10 0846) Weight:  [148.8 kg (328 lb 0.7 oz)-149.8 kg (330 lb 4 oz)] 148.8 kg (328 lb 0.7 oz) (12/10 0317)  PHYSICAL EXAMINATION: General:  Morbidly obese female in NAD Neuro:  Alert, oriented, non-focal HEENT:  [place]/AT, PERRL Cardiovascular:  RRR, no MRG Lungs:  Clear bilateral breath sounds Abdomen:  Soft, non-tender, non-distended Musculoskeletal:  No acute deformity, no edema.  Skin:  Grossly intact.   Recent Labs  Lab 01/17/17 0620 01/18/17 0028  NA 136 138  K 3.6 3.4*  CL 107 106  CO2 18* 22  BUN 10 9  CREATININE 0.62 0.67  GLUCOSE 94 101*   Recent Labs  Lab 01/17/17 0620 01/18/17 0028  HGB 13.2 12.2  HCT 40.4 37.6  WBC 9.7 11.1*  PLT 236 209   Ct Angio Chest Pe W/cm &/or Wo Cm  Result Date: 01/17/2017 CLINICAL DATA:  Shortness of breath and chest pain for the last several months. EXAM: CT ANGIOGRAPHY CHEST WITH CONTRAST TECHNIQUE: Multidetector CT imaging of the chest was performed using the standard protocol during bolus administration of intravenous contrast. Multiplanar CT image reconstructions and MIPs were obtained to evaluate the vascular anatomy. CONTRAST:  183mL ISOVUE-370 IOPAMIDOL (ISOVUE-370) INJECTION 76% COMPARISON:  Radiography same day.  CT 12/05/2016 FINDINGS: Cardiovascular: Pulmonary arterial opacification is good. 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 No  visible emboli on the left. No aortic disease. No coronary artery calcification. Maximum right ventricular diameter is 4.3 cm. Maximal left ventricular diameter is 2.5 cm. This suggests right ventricular strain. Additionally, the patient appears to have left ventricular muscular hypertrophy. Mediastinum/Nodes: Enlarged subcarinal and bilateral hilar lymph nodes. Lungs/Pleura: Right lung is clear. Left lung shows complete consolidation of the lower lobe with collapse. Areas of patchy pneumonia present throughout the left upper lobe. Upper Abdomen: Negative Musculoskeletal: Negative Review of the MIP images confirms the above findings. 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 Evidence of right ventricular strain. Positive for acute PE with CT evidence of right heart strain (RV/LV Ratio = 1.7) consistent with at least submassive (intermediate risk) PE. The presence of right heart strain has been associated with an increased risk of morbidity and mortality. Please activate Code PE by paging [phone]. Worsened consolidation and collapse throughout the left lower lobe. Patchy pneumonia present throughout the left upper lobe. Bilateral hilar and subcarinal lymphadenopathy, presumably reactive. Cannot rule out the possibility of underlying process such as sarcoid. Evidence of left ventricular hypertrophy. Electronically Signed   By: Nelson Chimes M.D.   On: 01/17/2017 09:24   Dg Chest Port 1 View  Result Date: 01/17/2017 CLINICAL DATA:  36 year old female with shortness  of breath. EXAM: PORTABLE CHEST 1 VIEW COMPARISON:  01/04/2017 FINDINGS: Cardiomediastinal silhouette is partially obscured but grossly unchanged. There is worsening opacity at the left lower lobe consistent with airspace consolidation. The right lung is clear. Small left-sided effusion not excluded. No definite pneumothorax. No acute osseous abnormalities. IMPRESSION: Worsening left lower lobe consolidation.  Continued follow-up recommended. Electronically Signed   By: Kristopher Oppenheim M.D.   On: 01/17/2017 06:59    ASSESSMENT / PLAN:  Pulmonary Embolism: RV/LV ratio 1.7.  - Continue supplemental oxygen and heparin infusion.  - Will defer thrombolytic therapies for now pending echocardiogram to better determine RV strain. Also with vaginal bleeding (spotting) will want to be more certain there is need before considering EKOS - Pharmacy consultation for heparin dosing - Venous doppler lower extremities - Hydrocodone for pleuritic chest pain and cough suppression - Will need to have Mirena IUD removed.   Chronic cough - Monitor - Cough suppression as above - Will need to reschedule bronchoscopy with Dr. Lamonte Sakai  Hx asthma without exacerbation - PRN albuterol inhaler  Georgann Housekeeper, AGACNP-BC [facility] Pulmonology/Critical Care Pager [phone] or [phone]  01/18/2017 9:53 AM

## 2017-01-18 NOTE — Progress Notes (Signed)
Subjective: Patient was resting in her bed watching TV today upon entering the room. She stated that she felt better today, slept well overnight she denied worsening pain or SOB. Stated that her leg pain had much improved, her chest pain was better, she was less SOB and more comfortable. Has been able to maintain oral nutrition with a regular diet currently. Would like for [place] to consult her pulmonologist but is aware that he will not be available. In addition she was concerned for vaginal bleeding stating that this is worse that the typical spotting she gets while on Azithromycin.   Objective:  Vital signs in last 24 hours: Vitals:   01/17/17 2039 01/17/17 2114 01/17/17 2259 01/18/17 0317  BP: 125/84  132/79 124/86  Pulse: (!) 109 (!) 103 93 88  Resp: (!) 27 20 (!) 29 (!) 28  Temp: 98.8 F (37.1 C)  98.9 F (37.2 C) 98.5 F (36.9 C)  TempSrc: Oral  Oral Oral  SpO2: 92% 98% 99% 97%  Weight:    (!) 328 lb 0.7 oz (148.8 kg)  Height:       ROS negative except as per HPI.  Physical Exam  Constitutional: She is oriented to person, place, and time. She appears well-developed and well-nourished. She does not appear ill. No distress.  Cardiovascular: Normal heart sounds. Tachycardia present.  Pulmonary/Chest: Effort normal. No accessory muscle usage. Tachypnea noted. No respiratory distress. She has decreased breath sounds in the right lower field and the left lower field. She has wheezes in the right upper field and the left upper field.  Abdominal: Soft. Bowel sounds are normal. There is no tenderness.  Musculoskeletal:       Right lower leg: She exhibits no tenderness and no edema.       Left lower leg: She exhibits no tenderness and no edema.  Neurological: She is alert and oriented to person, place, and time.  Skin: Capillary refill takes less than 2 seconds.  Vitals reviewed.  Assessment/Plan:  Active Problems:   CAP (community acquired pneumonia)   Asthma   Pulmonary embolism  ([place])  Crystal Gallagher is a pleasant 36 year old female who presents for severe shortness of breath and syncopal episodes following a bout of extreme coughing.  She was observed to have right-sided pulmonary embolism on CTA but was noted to be stable with regard to her blood pressure maintaining in the 120s over 80s, heart rate now down to the 80s previously tacky, but remaining tachypneic. The patient remains afebrile, uncomfortable but in no acute respiratory distress.  Pulmonary embolism: CTA evidence of PE currently on heparin being dosed by pharmacy.  Blood pressure is maintaining monitor for improvement. - Heparin consulted to pharmacy for dosing, levels drawn this morning -Patient be placed on high flow nasal cannula which seems to improve her symptoms we will consider discontinuing this as indicated - Please continue to observe patient for signs of worsening respiratory status. -Consult placed to [facility] pulmonology Dr. Pollyann Kennedy, given her RV strain, bronchoscopy is highly unlikely at this time and optimal medical management is most likely to be recommended.  -Pulmonology does not believe she is a candidate for bronchoscopy at this time given the right ventricular strain.  Will defer this to a later time to evaluate for sarcoidosis versus neoplasm.  -Plan to continue heparin to coumadin bridge once hypoxia improves  Community acquired pneumonia: Patient afebrile, but tachypneic with elevated white blood cell count to 11.1 today.  Given the patient's tentative status we will  continue treating for CAP at this time. -Vancomycin as per pharmacy dosing, will consider -Cefepime continued as per pharmacy consult -CBC revealing slight worsening leukocytosis with WBC 11.1, hemoglobin and platelets indicating dilutional effect.  The inverse relationship of the WBC with regard to hemoglobin platelets concerning for infection.  Vaginal bleeding: Will monitor and continue heparin at this time. May  need to consult OB/GYN for IUD removal if bleeding worsens or fails to improve  Constipation: Given mirilax without success Ordered lactulose x 1 dose  Asthma: We will continue patient's home albuterol inhaler as needed.  Dispo: Anticipated discharge in approximately 2-3 day(s).   Kathi Ludwig, MD 01/18/2017, 6:34 AM Pager: Pager# [phone]

## 2017-01-18 NOTE — Progress Notes (Signed)
LE venous duplex prelim: negative for DVT. Crystal Gallagher, RDMS, RVT  

## 2017-01-19 DIAGNOSIS — I2699 Other pulmonary embolism without acute cor pulmonale: Principal | ICD-10-CM

## 2017-01-19 DIAGNOSIS — R590 Localized enlarged lymph nodes: Secondary | ICD-10-CM

## 2017-01-19 LAB — CBC
HEMATOCRIT: 38.3 % (ref 36.0–46.0)
Hemoglobin: 12.3 g/dL (ref 12.0–15.0)
MCH: 28.5 pg (ref 26.0–34.0)
MCHC: 32.1 g/dL (ref 30.0–36.0)
MCV: 88.7 fL (ref 78.0–100.0)
Platelets: 224 10*3/uL (ref 150–400)
RBC: 4.32 MIL/uL (ref 3.87–5.11)
RDW: 14.5 % (ref 11.5–15.5)
WBC: 8.3 10*3/uL (ref 4.0–10.5)

## 2017-01-19 LAB — BASIC METABOLIC PANEL
Anion gap: 8 (ref 5–15)
BUN: 11 mg/dL (ref 6–20)
CHLORIDE: 106 mmol/L (ref 101–111)
CO2: 21 mmol/L — AB (ref 22–32)
CREATININE: 0.57 mg/dL (ref 0.44–1.00)
Calcium: 8.8 mg/dL — ABNORMAL LOW (ref 8.9–10.3)
GFR calc non Af Amer: >60 mL/min (ref >60)
Glucose, Bld: 85 mg/dL (ref 65–99)
Potassium: 3.8 mmol/L (ref 3.5–5.1)
Sodium: 135 mmol/L (ref 135–145)

## 2017-01-19 LAB — HEPARIN LEVEL (UNFRACTIONATED)
Heparin Unfractionated: 0.52 IU/mL (ref 0.30–0.70)
Heparin Unfractionated: 0.57 IU/mL (ref 0.30–0.70)

## 2017-01-19 LAB — PROTIME-INR
INR: 1.02
Prothrombin Time: 13.3 s (ref 11.4–15.2)

## 2017-01-19 MED ORDER — WARFARIN - PHARMACIST DOSING INPATIENT
Freq: Every day | Status: DC
  Administered 2017-01-20 – 2017-01-22 (×3)

## 2017-01-19 MED ORDER — GUAIFENESIN-CODEINE 100-10 MG/5ML PO SOLN
5.0000 mL | ORAL | Status: DC | PRN
  Administered 2017-01-19 – 2017-01-21 (×8): 5 mL via ORAL
  Filled 2017-01-19 (×8): qty 5

## 2017-01-19 MED ORDER — WARFARIN SODIUM 10 MG PO TABS
10.0000 mg | ORAL_TABLET | Freq: Once | ORAL | Status: AC
  Administered 2017-01-19: 10 mg via ORAL
  Filled 2017-01-19: qty 1

## 2017-01-19 NOTE — Progress Notes (Signed)
   Subjective: Patient was sitting up in her bed today upon entering the room.  She stated that her headache and back pain secondary to the cough greatly relieved with the cough syrup.  The patient denied additional complaints stating that her leg pain had resolved, her constipation has resolved with lactulose, she is breathing much easier at this point.  Patient denied abdominal pain, constipation, visual changes, fever, chills, shortness of breath while sitting, or additional acute complaints  Objective:  Vital signs in last 24 hours: Vitals:   01/18/17 1933 01/18/17 2250 01/19/17 0345 01/19/17 0836  BP: (!) 143/95 122/80 (!) 135/96 122/82  Pulse: 94 91  86  Resp: (!) 23 20  17   Temp: 98.7 F (37.1 C) 98.5 F (36.9 C) 98.9 F (37.2 C) 99 F (37.2 C)  TempSrc: Oral Oral Oral Oral  SpO2: 100% 98%  99%  Weight:      Height:       ROS negative except as per HPI.  Physical Exam  Constitutional: She appears well-developed and well-nourished. She does not appear ill. No distress.  Neck: No JVD present.  Cardiovascular: Normal heart sounds. Tachycardia present. Exam reveals no decreased pulses.  Pulmonary/Chest: Effort normal. No accessory muscle usage. No tachypnea. No respiratory distress. She has wheezes in the right upper field and the left upper field. She has rhonchi in the right upper field.  Abdominal: Soft. She exhibits no distension. There is no tenderness.  Musculoskeletal:       Right lower leg: She exhibits no tenderness and no edema.       Left lower leg: She exhibits no tenderness and no edema.  Vitals reviewed.   Assessment/Plan:  Active Problems:   HCAP (healthcare-associated pneumonia)   Asthma   Pulmonary embolism ([place])  Pulmonary Embolism: CT evidence of PE currently on heparin being. -Heparin to Coumadin bridge consult placed to pharmacy -The pulmonology following, the plan to reschedule precocity with Dr. Lamonte Sakai later date -ACE level pending -Continue  submental oxygen as needed - Vascular ultrasound for DVT pulmonary result was negative following final report - Echocardiogram-left ventricular function was normal right ventricle was normal size wall thickness was normal systolic function was normal, EF 60-65%, mild mitral regurg all the valves intact  HCAP: Patient admitted with initial concerns for.  Patient remains afebrile, WBC decreased to 8.3, hemoglobin 12.3, pressure stable, no signs of acute infection at this time. -We will continue cefepime to complete a 5-day course - Ligaments and discontinue the prior day -CBC will be repeated in the a.m.  Vaginal Bleeding: Patient denied worsening or vaginal bleeding at this time.  Stated moderately improved only mild spotting this morning. We will continue to follow this and consider consulting OB/GYN for outpatient IUD removal and she will be on long-term warfarin therapy  Constipation: Patient with MiraLAX without success. Patient given lactulose which induced significant release of her bowels multiple bowel movements.  Asthma: Patient continued on her home albuterol inhaler as needed  Dispo: Anticipated discharge in approximately 2-3 day(s).   Kathi Ludwig, MD 01/19/2017, 11:08 AM Pager: Pager# [phone]

## 2017-01-19 NOTE — Progress Notes (Addendum)
ANTICOAGULATION CONSULT NOTE  Pharmacy Consult for Heparin  Indication: pulmonary embolus  No Known Allergies  Patient Measurements: Height: 5\' 6"  (167.6 cm) Weight: (!) 328 lb 0.7 oz (148.8 kg) IBW/kg (Calculated) : 59.3  Vital Signs: Temp: 99 F (37.2 C) (12/11 0836) Temp Source: Oral (12/11 0836) BP: 122/82 (12/11 0836) Pulse Rate: 86 (12/11 0836)  Labs: Recent Labs    01/17/17 0620  01/18/17 0028  01/18/17 1629 01/19/17 0022 01/19/17 0615  HGB 13.2  --  12.2  --   --   --  12.3  HCT 40.4  --  37.6  --   --   --  38.3  PLT 236  --  209  --   --   --  224  HEPARINUNFRC  --    < > 0.28*   < > 0.22* 0.57 0.52  CREATININE 0.62  --  0.67  --   --   --  0.57   < > = values in this interval not displayed.    Estimated Creatinine Clearance: 143.1 mL/min (by C-G formula based on SCr of 0.57 mg/dL).  Assessment: 36 y.o. female with PE on heparin (doppler negative for DVT). She is noted with vaginal bleeding and possible OB/GYN consult per MD notes. Plans noted for coumadin when more stable -CBC stable, heparin level= 0.52 and at goal   Goal of Therapy:  Heparin level 0.3-0.7 units/ml Monitor platelets by anticoagulation protocol: Yes   Plan:  -No heparin changes needed -Daily heparin level and CBC -Baseline PT/INR in am  Hildred Laser, Pharm D 01/19/2017 10:31 AM  Addendum -Adding coumadin today  Plan -Will plan for coumadin 10mg  po today as long as INR is not elevated -PT/INR now and daily  Hildred Laser, Pharm D 01/19/2017 11:52 AM    -PT/INR now

## 2017-01-19 NOTE — Care Management Note (Signed)
Case Management Note  Patient Details  Name: Crystal Gallagher MRN: [id number] Date of Birth: May 18, 1980  Subjective/Objective:   Pt admitted with SOB- pt has had recurrence of the issue multiple times and was treated unsuccessfully with antibiotics.  CT confirmed pt has PE with RV strain along with consolidation and collapse of left lower lobe and PNA in the same lobe - pt is now requiring supplemental oxygen                   Action/Plan:   PTA independent from home.  CM will continue to follow for discharge needs   Expected Discharge Date:                  Expected Discharge Plan:  Home/Self Care  In-House Referral:     Discharge planning Services  CM Consult  Post Acute Care Choice:    Choice offered to:     DME Arranged:    DME Agency:     HH Arranged:    HH Agency:     Status of Service:     If discussed at [employer] of Stay Meetings, dates discussed:    Additional Comments:  Maryclare Labrador, RN 01/19/2017, 3:15 PM

## 2017-01-19 NOTE — Progress Notes (Signed)
ANTICOAGULATION CONSULT NOTE  Pharmacy Consult for Heparin  Indication: pulmonary embolus  No Known Allergies  Patient Measurements: Height: 5\' 6"  (167.6 cm) Weight: (!) 328 lb 0.7 oz (148.8 kg) IBW/kg (Calculated) : 59.3  Vital Signs: Temp: 98.5 F (36.9 C) (12/10 2250) Temp Source: Oral (12/10 2250) BP: 122/80 (12/10 2250) Pulse Rate: 91 (12/10 2250)  Labs: Recent Labs    01/17/17 0620  01/18/17 0028 01/18/17 0947 01/18/17 1629 01/19/17 0022  HGB 13.2  --  12.2  --   --   --   HCT 40.4  --  37.6  --   --   --   PLT 236  --  209  --   --   --   HEPARINUNFRC  --    < > 0.28* 0.33 0.22* 0.57  CREATININE 0.62  --  0.67  --   --   --    < > = values in this interval not displayed.    Estimated Creatinine Clearance: 143.1 mL/min (by C-G formula based on SCr of 0.67 mg/dL).  Assessment: 36 y.o. female with PE for heparin  Goal of Therapy:  Heparin level 0.3-0.7 units/ml Monitor platelets by anticoagulation protocol: Yes   Plan:  Continue Heparin at current rate   Adrieana Fennelly, [employer] 01/19/2017,1:11 AM

## 2017-01-19 NOTE — Plan of Care (Signed)
Continue current care plan 

## 2017-01-19 NOTE — Progress Notes (Signed)
Name: Crystal Gallagher MRN: [id number] DOB: 1980/04/10    ADMISSION DATE:  01/17/2017 CONSULTATION DATE:  12/10  REFERRING MD :  Dr. Thayer Headings  CHIEF COMPLAINT:  PE  HISTORY OF PRESENT ILLNESS:  36 year old female with PMH significant for Asthma; never smoker. She is currently being worked up in pulmonary clinic for chronic cough for 3 months along with left lower lobe infiltrate, left hilar and mediastinal lymphadenopathy and pulmonary nodules.  She was scheduled for bronchoscopy with Dr Lamonte Sakai 12/10, but 12/9 her cough worsened and she developed dyspnea. She had several syncopal episodes before presenting to [facility] ED 12/9 where she had CT angiogram done demonstrating pulmonary embolism. She was admitted to IMTS and started on heparin infusion.  After heparin started she did have some vaginal bleeding, which she reports is not consistent with her menses. RV/LV ratio 1.7 prompting PCCM consult for consideration of some for of thrombolytic therapy.   SUBJECTIVE:  No complaints of cp.  Mild SOB w/ getting up to [facility].  Reports ongoing coughing issues which is what makes her most SOB.  She stated her syncopal episodes at home followed her coughing spells.  Currently on 5L [place] at 100%  VITAL SIGNS: Temp:  [98.5 F (36.9 C)-99.3 F (37.4 C)] 99 F (37.2 C) (12/11 0836) Pulse Rate:  [86-103] 86 (12/11 0836) Resp:  [17-23] 17 (12/11 0836) BP: (122-143)/(80-96) 122/82 (12/11 0836) SpO2:  [96 %-100 %] 99 % (12/11 0836)  PHYSICAL EXAMINATION: General:  Morbidly obese female sitting in bed in NAD, mother at bedside  HEENT: MM pink/moist Neuro: AOx3, MAE, non-focal CV:  rrr, no m/r/g PULM: even/non-labored, mild tachypnea, mild left upper lobe wheeze otherwise clear and diminished, non-productive cough, 95-96% on room air in my presence.  GI: obese, soft, non-tender, bs active  Extremities: warm/dry, no edema, warmth, or erythema Skin: no rashes    Recent Labs  Lab 01/17/17 0620  01/18/17 0028 01/19/17 0615  NA 136 138 135  K 3.6 3.4* 3.8  CL 107 106 106  CO2 18* 22 21*  BUN 10 9 11   CREATININE 0.62 0.67 0.57  GLUCOSE 94 101* 85   Recent Labs  Lab 01/17/17 0620 01/18/17 0028 01/19/17 0615  HGB 13.2 12.2 12.3  HCT 40.4 37.6 38.3  WBC 9.7 11.1* 8.3  PLT 236 209 224   No results found.   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 Evidence of right ventricular strain.  (RV/LV Ratio = 1.7) Echo 12/10 >  EF 60-65%, PAP 29 mmHg, left pleural effusion, normal RV function/size, IVC normal vessel size Venous doppler 12/10 >  Prelim neg for DVT >   ASSESSMENT / PLAN:  Pulmonary Embolism: large clot in right PA w/ RV/LV ratio 1.7.  - Continue to wean supplemental oxygen as needed- currently at 96% on room air in my presence - continue heparin gtt per pharmacy for goal of at least 28 hours - ongoing monitor with abnormal vaginal bleeding (pt has IUD for previous menorrhagia) - Per TTE with no RV abnormality or strain with normal PAP pressures and with unilateral clot, do not recommend EKOS therapy - Venous doppler lower extremities pending-> prelim report neg, await final reports - cough suppression with guaifenesin/codeine - may need further outpatient workup to assess for idiopathic, thrombophilic conditions  Chronic cough (3 months) with left lower lobe infiltrate, left hilar and mediastinal  lymphadenopathy and pulmonary nodules.  - Monitor - Cough suppression as above - Will need to reschedule bronchoscopy with Dr. Lamonte Sakai, as this will be somewhat complicated by need for oral anticoagulation therapy with PE - ACE level pending  Hx asthma without exacerbation - PRN albuterol  - further outpatient testing planned with next pulmonary office appt  Kennieth Rad, AGACNP-BC [place] Pgr: [phone] or if no answer  [phone] 01/19/2017, 10:23 AM

## 2017-01-20 DIAGNOSIS — M79606 Pain in leg, unspecified: Secondary | ICD-10-CM

## 2017-01-20 DIAGNOSIS — R0682 Tachypnea, not elsewhere classified: Secondary | ICD-10-CM

## 2017-01-20 LAB — CBC
HEMATOCRIT: 37.9 % (ref 36.0–46.0)
Hemoglobin: 12.2 g/dL (ref 12.0–15.0)
MCH: 28 pg (ref 26.0–34.0)
MCHC: 32.2 g/dL (ref 30.0–36.0)
MCV: 86.9 fL (ref 78.0–100.0)
PLATELETS: 258 10*3/uL (ref 150–400)
RBC: 4.36 MIL/uL (ref 3.87–5.11)
RDW: 14.1 % (ref 11.5–15.5)
WBC: 8.8 10*3/uL (ref 4.0–10.5)

## 2017-01-20 LAB — PROTIME-INR
INR: 1.05
PROTHROMBIN TIME: 13.6 s (ref 11.4–15.2)

## 2017-01-20 LAB — HEPARIN LEVEL (UNFRACTIONATED): HEPARIN UNFRACTIONATED: 0.43 [IU]/mL (ref 0.30–0.70)

## 2017-01-20 LAB — ANGIOTENSIN CONVERTING ENZYME: ANGIOTENSIN-CONVERTING ENZYME: 21 U/L (ref 14–82)

## 2017-01-20 MED ORDER — POLYETHYLENE GLYCOL 3350 17 G PO PACK
17.0000 g | PACK | Freq: Two times a day (BID) | ORAL | Status: DC
  Administered 2017-01-20: 17 g via ORAL
  Filled 2017-01-20 (×5): qty 1

## 2017-01-20 MED ORDER — WARFARIN SODIUM 10 MG PO TABS
10.0000 mg | ORAL_TABLET | Freq: Once | ORAL | Status: AC
  Administered 2017-01-20: 10 mg via ORAL
  Filled 2017-01-20: qty 1

## 2017-01-20 NOTE — Progress Notes (Signed)
   Subjective: The patient was resting comfortably in her bed today point in the room.  She denied acute complaints and that her headache improved greatly with decreased coughing, A back pain was much better as well.  Patient stated that her leg pain was persistent but it is consistent with a static-based pain she has had for several years in the past.  The patient understood the plan to continue the heparin to Coumadin bridge and to discuss bronchoscopy with pulmonary.  She denied nausea, vomiting, headache, chest pain, abdominal pain, diarrhea, or dysuria.  The patient tested to continue constipation has not had a bowel movement since given lactulose 2 days prior.  Objective:  Vital signs in last 24 hours: Vitals:   01/19/17 1145 01/19/17 1623 01/20/17 0345 01/20/17 0818  BP: (!) 136/92 118/85 139/85 (!) 123/94  Pulse: 100 89 95 87  Resp: (!) 26 (!) 24 (!) 21 (!) 22  Temp: 98.9 F (37.2 C) 98.9 F (37.2 C) 99.2 F (37.3 C) 98.9 F (37.2 C)  TempSrc: Oral  Oral Oral  SpO2: 94% 96% 95% 96%  Weight:      Height:       ROS negative except as per HPI.  Physical Exam  Constitutional: She is oriented to person, place, and time. She appears well-developed and well-nourished. She does not appear ill. No distress.  Cardiovascular: Normal rate and regular rhythm.  Pulmonary/Chest: Effort normal. No accessory muscle usage. Tachypnea noted. No respiratory distress. She has wheezes in the right upper field and the left upper field.  Abdominal: Soft. She exhibits no distension. There is no tenderness.  Neurological: She is alert and oriented to person, place, and time.  Skin: Skin is warm. Capillary refill takes less than 2 seconds.  Vitals reviewed.  Assessment/Plan:  Active Problems:   HCAP (healthcare-associated pneumonia)   Asthma   Pulmonary embolism on right [facility])   Mediastinal lymphadenopathy  Pulmonary embolism: CTA demonstrated right-sided PE with RV strain. -Patient is being  bridged from heparin to Coumadin with INR 1.05 today as per pharmacy. -Echocardiogram failed to demonstrate significant RV enlargement or strain with an EF of 60-65% and mild mitral regurg. -Pulmonology consulted and following patient considering bronchoscopy at a later date. - Vascular ultrasound failed to reveal DVT. -Patient no longer on oxygen. Will ambulate today with pulse ox to determine O2 sats and need for home O2. - Placed request for nursing to emanate patient every 6 hourly more frequently if needed.  HCAP: Patient admitted with initial concerns for.  Patient remains afebrile, WBC decreased to 8.3, hemoglobin 12.3, pressure stable, no signs of acute infection at this time. -We will continue cefepime to complete a 7-day course of treatment of discharge sooner with switch to oral antibiotic at that time. -CBC vitamin demonstrate leukocytosis, will repeat if indicated  Vaginal Bleeding: Patient denied worsening or vaginal bleeding at this time.  Stated moderately improved only mild spotting this morning. We will continue to follow this and consider consulting OB/GYN for outpatient IUD removal and she will be on long-term warfarin therapy -Hemoglobin stable at 12.2, no need to repeat unless bleeding worsens  Constipation: MiraLAX BID today. If this fails to resolve the patient's constipation we will again advanced to a dose of lactulose.  Asthma: Patient continued on her home albuterol inhaler as needed  Dispo: Anticipated discharge in approximately 2-3 day(s).   Kathi Ludwig, MD 01/20/2017, 8:38 AM Pager: Pager# [phone]

## 2017-01-20 NOTE — Progress Notes (Addendum)
[place] for Heparin/coumadin  Indication: pulmonary embolus  No Known Allergies  Patient Measurements: Height: 5\' 6"  (167.6 cm) Weight: (!) 328 lb 0.7 oz (148.8 kg) IBW/kg (Calculated) : 59.3  Vital Signs: Temp: 98.9 F (37.2 C) (12/12 0818) Temp Source: Oral (12/12 0818) BP: 123/94 (12/12 0818) Pulse Rate: 87 (12/12 0818)  Labs: Recent Labs    01/18/17 0028  01/19/17 0022 01/19/17 0615 01/19/17 1212 01/20/17 0229  HGB 12.2  --   --  12.3  --  12.2  HCT 37.6  --   --  38.3  --  37.9  PLT 209  --   --  224  --  258  LABPROT  --   --   --   --  13.3 13.6  INR  --   --   --   --  1.02 1.05  HEPARINUNFRC 0.28*   < > 0.57 0.52  --  0.43  CREATININE 0.67  --   --  0.57  --   --    < > = values in this interval not displayed.    Estimated Creatinine Clearance: 143.1 mL/min (by C-G formula based on SCr of 0.57 mg/dL).  Assessment: 36 y.o. female with PE on heparin/coumadin (doppler negative for DVT). She is noted with vaginal bleeding and this has improved per notes. She is on day 2 of coumadin/heparin overlap -INR= 1.02, heparin level = 0.43  Goal of Therapy:  Heparin level 0.3-0.7 units/ml Monitor platelets by anticoagulation protocol: Yes   Plan:  -No heparin changes needed -Coumadin 10mg  po today -Daily heparin level and CBC -Daily PT/INR  Hildred Laser, Pharm D 01/20/2017 9:29 AM  Addendum -Spoke with Dr. Berline Lopes: may change to lovenox if patient can afford this -Per case manager report lovenox is covered with a zero dollar copay  Plan -Lovenox 150mg  [place] q12h (start at 8pm) -Discontinue heparin at at [place], Pharm D 01/21/2017 2:50 PM

## 2017-01-20 NOTE — Discharge Instructions (Addendum)

## 2017-01-21 DIAGNOSIS — J9601 Acute respiratory failure with hypoxia: Secondary | ICD-10-CM

## 2017-01-21 DIAGNOSIS — R05 Cough: Secondary | ICD-10-CM

## 2017-01-21 LAB — CBC
HCT: 36.5 % (ref 36.0–46.0)
HEMOGLOBIN: 12.4 g/dL (ref 12.0–15.0)
MCH: 29.6 pg (ref 26.0–34.0)
MCHC: 34 g/dL (ref 30.0–36.0)
MCV: 87.1 fL (ref 78.0–100.0)
Platelets: 256 10*3/uL (ref 150–400)
RBC: 4.19 MIL/uL (ref 3.87–5.11)
RDW: 14.2 % (ref 11.5–15.5)
WBC: 9.5 10*3/uL (ref 4.0–10.5)

## 2017-01-21 LAB — HEPARIN LEVEL (UNFRACTIONATED): Heparin Unfractionated: 0.57 IU/mL (ref 0.30–0.70)

## 2017-01-21 LAB — PROTIME-INR
INR: 1.26
Prothrombin Time: 15.7 seconds — ABNORMAL HIGH (ref 11.4–15.2)

## 2017-01-21 MED ORDER — WARFARIN SODIUM 2.5 MG PO TABS
12.5000 mg | ORAL_TABLET | Freq: Once | ORAL | Status: AC
  Administered 2017-01-21: 12.5 mg via ORAL
  Filled 2017-01-21: qty 1

## 2017-01-21 MED ORDER — ENOXAPARIN SODIUM 150 MG/ML ~~LOC~~ SOLN
150.0000 mg | Freq: Two times a day (BID) | SUBCUTANEOUS | Status: DC
  Administered 2017-01-21 – 2017-01-23 (×4): 150 mg via SUBCUTANEOUS
  Filled 2017-01-21 (×5): qty 1

## 2017-01-21 MED ORDER — BENZONATATE 100 MG PO CAPS
100.0000 mg | ORAL_CAPSULE | Freq: Three times a day (TID) | ORAL | Status: DC | PRN
  Administered 2017-01-21 (×3): 100 mg via ORAL
  Filled 2017-01-21 (×3): qty 1

## 2017-01-21 MED ORDER — IBUPROFEN 600 MG PO TABS
600.0000 mg | ORAL_TABLET | Freq: Four times a day (QID) | ORAL | Status: DC | PRN
  Administered 2017-01-21 – 2017-01-23 (×6): 600 mg via ORAL
  Filled 2017-01-21 (×6): qty 1

## 2017-01-21 MED ORDER — GUAIFENESIN-CODEINE 100-10 MG/5ML PO SOLN
5.0000 mL | ORAL | Status: DC | PRN
  Administered 2017-01-21 – 2017-01-23 (×7): 5 mL via ORAL
  Filled 2017-01-21 (×7): qty 5

## 2017-01-21 MED ORDER — DEXTROSE 5 % IV SOLN
2.0000 g | Freq: Two times a day (BID) | INTRAVENOUS | Status: AC
  Administered 2017-01-21: 2 g via INTRAVENOUS
  Filled 2017-01-21 (×2): qty 2

## 2017-01-21 MED ORDER — LEVOFLOXACIN 750 MG PO TABS
750.0000 mg | ORAL_TABLET | Freq: Every day | ORAL | Status: AC
  Administered 2017-01-22 – 2017-01-23 (×2): 750 mg via ORAL
  Filled 2017-01-21 (×2): qty 1

## 2017-01-21 MED ORDER — KETOROLAC TROMETHAMINE 15 MG/ML IJ SOLN
15.0000 mg | Freq: Four times a day (QID) | INTRAMUSCULAR | Status: DC | PRN
  Administered 2017-01-21: 15 mg via INTRAVENOUS
  Filled 2017-01-21: qty 1

## 2017-01-21 NOTE — Progress Notes (Addendum)
[place] for Heparin/coumadin  Indication: pulmonary embolus  No Known Allergies  Patient Measurements: Height: 5\' 6"  (167.6 cm) Weight: (!) 328 lb 0.7 oz (148.8 kg) IBW/kg (Calculated) : 59.3  Vital Signs: Temp: 97.7 F (36.5 C) (12/13 0759) Temp Source: Oral (12/13 0759) BP: 145/89 (12/13 0759) Pulse Rate: 87 (12/13 0759)  Labs: Recent Labs    01/19/17 0615 01/19/17 1212 01/20/17 0229 01/21/17 0415  HGB 12.3  --  12.2 12.4  HCT 38.3  --  37.9 36.5  PLT 224  --  258 256  LABPROT  --  13.3 13.6 15.7*  INR  --  1.02 1.05 1.26  HEPARINUNFRC 0.52  --  0.43 0.57  CREATININE 0.57  --   --   --     Estimated Creatinine Clearance: 143.1 mL/min (by C-G formula based on SCr of 0.57 mg/dL).  Assessment: 36 y.o. female with PE on heparin/coumadin (doppler negative for DVT). She is noted with vaginal bleeding and this has improved per notes. She is on day 3 of coumadin/heparin overlap -INR= 1.26, heparin level = 0.57  Goal of Therapy:  Heparin level 0.3-0.7 units/ml Monitor platelets by anticoagulation protocol: Yes   Plan:  -No heparin changes needed -Coumadin 12.5mg  po today -Daily heparin level and CBC -Daily PT/INR  Hildred Laser, Pharm D 01/21/2017 10:25 AM

## 2017-01-21 NOTE — Progress Notes (Signed)
Report called to [address] [facility], pt to transfer to 6E29 telemetry bed. Report given to RN Yoko. Pt will transfer via wheelchair with belongings.

## 2017-01-21 NOTE — Progress Notes (Signed)
Staff now available to transport pt and her mother (wheelchair needed for mother also) to unit [place] - room [phone]. Pt transported on monitor and with her belongings.

## 2017-01-21 NOTE — Progress Notes (Signed)
Subjective: When he crosses a pleasant 36 year old female continues to receive heparin therapy with a bridge to Coumadin for prior pulmonary embolism.  She states this morning that she felt much better than previous few days and was able to make it to her bed from the chair and back again significant difficulty.  However, she is so concerned that she may experience a syncopal events following a coughing fit as she again experienced a mild event with the prior day.  Patient states that her mother is gone home and that she is aware that they both need to ambulate to prevent worsening risk of DVT.  She denied current headache, nausea, vomiting, chest pain, abdominal pain, diarrhea, muscle aches, fever, chills, or worsening cough.  Objective:  Vital signs in last 24 hours: Vitals:   01/20/17 0818 01/20/17 1212 01/20/17 1930 01/21/17 0400  BP: (!) 123/94 131/78 120/85 (!) 146/94  Pulse: 87 94 (!) 102 92  Resp: (!) 22 (!) 21 (!) 23 18  Temp: 98.9 F (37.2 C) 98.6 F (37 C) 98.7 F (37.1 C) 98.5 F (36.9 C)  TempSrc: Oral Oral Oral Oral  SpO2: 96% 96% 97% 94%  Weight:      Height:       ROS negative except as per HPI.  Physical Exam  Constitutional: She appears well-developed and well-nourished. She does not appear ill.  Cardiovascular: Normal rate, regular rhythm and normal heart sounds.  No murmur heard. Pulmonary/Chest: Breath sounds normal. No stridor. Tachypnea noted. She has no wheezes.  Abdominal: Soft. Bowel sounds are normal. She exhibits no distension. There is no tenderness.  Musculoskeletal:       Right lower leg: Normal. She exhibits no tenderness and no edema.       Left lower leg: She exhibits no tenderness and no edema.  Vitals reviewed.   Assessment/Plan:  Active Problems:   HCAP (healthcare-associated pneumonia)   Asthma   Pulmonary embolism on right [facility])   Mediastinal lymphadenopathy  Pulmonary embolism: CTA demonstrated right-sided PE with RV strain.   Echocardiogram felt to demonstrate right-sided strain the following day indicating improvement in the patient's RV function. No evidence of LV dysfunction(diastolic dysfunction) -Patient is being bridged from heparin to Coumadin with INR 1.23 today as per pharmacy consult. -Pulmonology indicated patient will need to follow-up with Dr. Lamonte Sakai in 4-6 weeks for bronchoscopy given her recent PE. - Patient no longer on oxygen, place order to ambulate with O2 sats for activity -Patient may potentially need to be discharged home with Lovenox bridge to Coumadin if possible --Will transfer to telemetry from stepdown today.  HAP: Patient admitted with initial concerns for HAP as she was hospitalized in October but does occur within the appropriate timeframe for healthcare associated pneumonia.  The patient's white blood cell count has remained stable and within normal limits.  She is afebrile, vitals have maintained within normal limits reaching near hypertension of recent. -We will continue cefepime to complete 7-day course of treatment.  On discharge, will switch to PO antibiotic with equal efficacy.  Vaginal bleeding Patient denied worsening vaginal bleeding stating that it is reduced to a mild spotting at this point. We will continue to follow this and consider completing OB/GYN for outpatient IUD removal when discharged. -Hemoglobin stable at 12.2 prior day no need to repeat less bleeding worsens.  Constipation: MiraLAX twice daily today evaluate if this is been effective  Asthma: Patient continued on her home albuterol inhaler as needed  Dispo: Anticipated discharge in approximately  1-2 day(s).   Kathi Ludwig, MD 01/21/2017, 6:52 AM Pager: Pager# [phone]

## 2017-01-22 ENCOUNTER — Inpatient Hospital Stay (HOSPITAL_COMMUNITY): Admission: RE | Admit: 2017-01-22 | Payer: 59 | Source: Ambulatory Visit

## 2017-01-22 DIAGNOSIS — R05 Cough: Secondary | ICD-10-CM

## 2017-01-22 DIAGNOSIS — R55 Syncope and collapse: Secondary | ICD-10-CM

## 2017-01-22 LAB — PROTIME-INR
INR: 1.64
Prothrombin Time: 19.3 seconds — ABNORMAL HIGH (ref 11.4–15.2)

## 2017-01-22 LAB — CULTURE, BLOOD (ROUTINE X 2)
CULTURE: NO GROWTH
Culture: NO GROWTH
Special Requests: ADEQUATE

## 2017-01-22 MED ORDER — ENOXAPARIN SODIUM 150 MG/ML ~~LOC~~ SOLN: 150.0000 mg | Freq: Two times a day (BID) | SUBCUTANEOUS | 0 refills | Status: DC

## 2017-01-22 MED ORDER — WARFARIN SODIUM 7.5 MG PO TABS
7.5000 mg | ORAL_TABLET | Freq: Once | ORAL | Status: AC
  Administered 2017-01-22: 7.5 mg via ORAL
  Filled 2017-01-22: qty 1

## 2017-01-22 MED ORDER — WARFARIN SODIUM 5 MG PO TABS
5.0000 mg | ORAL_TABLET | Freq: Once | ORAL | Status: DC
Start: 2017-01-22 — End: 2017-01-22

## 2017-01-22 NOTE — Progress Notes (Addendum)
[place] for Heparin/coumadin  Indication: pulmonary embolus  No Known Allergies  Patient Measurements: Height: 5\' 6"  (167.6 cm) Weight: (!) 333 lb 8.9 oz (151.3 kg) IBW/kg (Calculated) : 59.3  Vital Signs: Temp: 98.1 F (36.7 C) (12/14 0500) Temp Source: Oral (12/14 0500) BP: 119/84 (12/14 0500) Pulse Rate: 88 (12/14 0500)  Labs: Recent Labs    01/20/17 0229 01/21/17 0415 01/22/17 0334  HGB 12.2 12.4  --   HCT 37.9 36.5  --   PLT 258 256  --   LABPROT 13.6 15.7* 19.3*  INR 1.05 1.26 1.64  HEPARINUNFRC 0.43 0.57  --     Estimated Creatinine Clearance: 144.7 mL/min (by C-G formula based on SCr of 0.57 mg/dL).  Assessment: 36 y.o. female with PE on heparin/coumadin (doppler negative for DVT). She is noted with vaginal bleeding and this has improved per notes. She is on day 4 of coumadin/heparin overlap.  INR today increased to 1.64 from 1.26 after receiving 12.5 mg last night- now likely seeing full effects of warfarin dosing. Now on enoxaparin 150 mg every 12 hours - renal function has been stable. Completing antibiotic treatment with levofloxacin, which will impact warfarin sensitivity. CBC has been stable. No signs/symptoms of bleeding noted in chart.  Goal of Therapy:  Heparin level 0.3-0.7 units/ml Monitor platelets by anticoagulation protocol: Yes   Plan:  -Continue enoxaparin 150 mg every 12 hours -Reduce Coumadin to 7.5 mg tonight given INR increase and abx interaction -Daily CBC and PT/INR  Doylene Canard, PharmD Clinical Pharmacist  Pager: [phone] Clinical Phone for 01/22/2017 until 3:30pm: [phone] If after 3:30pm, please call main pharmacy at [phone] 01/22/2017 8:04 AM

## 2017-01-22 NOTE — Plan of Care (Signed)
Patient maintains oxygen saturation levels above 96% on room air. No complaints of pain or signs of distress

## 2017-01-22 NOTE — Evaluation (Signed)
Physical Therapy Evaluation Patient Details Name: Crystal Gallagher MRN: [id number] DOB: Apr 06, 1980 Today's Date: 01/22/2017   History of Present Illness  pt is a 36 y/o female with pmh significant for asthma, presenting to ED with coughing, chest tightness and SOB.  pt reports passing out after bouts of coughing.  In ED, CTA showed a Right PE with RV strain and collapsure of left lower lobe with patchy PNA.  Clinical Impression  Pt is at or close to baseline functioning and should be safe at home with available assist. There are no further acute PT needs.  Will sign off at this time.     Follow Up Recommendations No PT follow up    Equipment Recommendations  None recommended by PT    Recommendations for Other Services       Precautions / Restrictions Precautions Precautions: None Precaution Comments: watch sats      Mobility  Bed Mobility Overal bed mobility: Independent                Transfers Overall transfer level: Independent Equipment used: None                Ambulation/Gait Ambulation/Gait assistance: Supervision;Modified independent (Device/Increase time) Ambulation Distance (Feet): 280 Feet Assistive device: None Gait Pattern/deviations: Step-through pattern Gait velocity: age appropriate speed Gait velocity interpretation: at or above normal speed for age/gender General Gait Details: generally steady and able to change speeds with ease though did experience mild dyspnea when pushing the speed.  Sats stayed at or above 98% with EHR maxing out at 122 bpm  Stairs Stairs: Yes Stairs assistance: Min guard Stair Management: One rail Right;Alternating pattern;Forwards Number of Stairs: 4 General stair comments: safe with rail  Wheelchair Mobility    Modified Rankin (Stroke Patients Only)       Balance Overall balance assessment: No apparent balance deficits (not formally assessed)                                            Pertinent Vitals/Pain Pain Assessment: No/denies pain    Home Living Family/patient expects to be discharged to:: Private residence Living Arrangements: Parent Available Help at Discharge: Family;Available 24 hours/day Type of Home: House       Home Layout: One level Home Equipment: None      Prior Function Level of Independence: Independent               Hand Dominance        Extremity/Trunk Assessment        Lower Extremity Assessment Lower Extremity Assessment: Overall WFL for tasks assessed       Communication   Communication: No difficulties  Cognition Arousal/Alertness: Awake/alert Behavior During Therapy: WFL for tasks assessed/performed Overall Cognitive Status: Within Functional Limits for tasks assessed                                        General Comments      Exercises     Assessment/Plan    PT Assessment Patent does not need any further PT services  PT Problem List         PT Treatment Interventions      PT Goals (Current goals can be found in the Care Plan section)  Acute Rehab PT Goals  PT Goal Formulation: All assessment and education complete, DC therapy    Frequency     Barriers to discharge        Co-evaluation               AM-PAC PT "6 Clicks" Daily Activity  Outcome Measure Difficulty turning over in bed (including adjusting bedclothes, sheets and blankets)?: None Difficulty moving from lying on back to sitting on the side of the bed? : None Difficulty sitting down on and standing up from a chair with arms (e.g., wheelchair, bedside commode, etc,.)?: None Help needed moving to and from a bed to chair (including a wheelchair)?: None Help needed walking in hospital room?: None Help needed climbing 3-5 steps with a railing? : A Little 6 Click Score: 23    End of Session   Activity Tolerance: Patient tolerated treatment well Patient left: in bed;with call bell/phone within reach(at  EOB) Nurse Communication: Mobility status PT Visit Diagnosis: Difficulty in walking, not elsewhere classified (R26.2)    Time: 9528-4132 PT Time Calculation (min) (ACUTE ONLY): 18 min   Charges:   PT Evaluation $PT Eval Low Complexity: 1 Low     PT G Codes:        02-05-2017  Donnella Sham, PT [phone]  (pager)  [name] 05-Feb-2017, 4:04 PM

## 2017-01-22 NOTE — Progress Notes (Signed)
Subjective: Patient resting comfortably in her bed today upon entering the room. She stated that she had been able to walk to the chair and bathroom without significant issue today. She denied acute concerns or complaints at this time. She is in agreement with the plan to discharge her home tomorrow on Lovenox and to follow up with the [facility] warfarin clinic on Monday for an evaluation of her INR.   The order has been sent to her pharmacy for the lovenox pens. She was given 7 for a 3.5 days supply. She will follow up as needed thereafter. Case management has assisted her with placing appointment with the pulmonologist and with a nebulizer machine at home as well. She may benefit from an albuterol inhaler PRN as well.  Objective:  Vital signs in last 24 hours: Vitals:   01/21/17 1145 01/21/17 1800 01/21/17 2051 01/22/17 0500  BP: 123/85 (!) 158/100 118/71 119/84  Pulse: 96 98 96 88  Resp: (!) 27 17 18 17   Temp: 98.7 F (37.1 C) 97.9 F (36.6 C) 98.6 F (37 C) 98.1 F (36.7 C)  TempSrc: Oral Oral Oral Oral  SpO2: 97% 98% 97% 97%  Weight:    (!) 333 lb 8.9 oz (151.3 kg)  Height:       ROS negative except as per HPI.  Physical Exam  Constitutional: She appears well-developed and well-nourished. She does not appear ill.  Pulmonary/Chest: Effort normal. No accessory muscle usage. No tachypnea. No respiratory distress. She has wheezes in the right upper field and the left upper field.  Abdominal: Soft. Bowel sounds are normal. There is no tenderness.  Musculoskeletal:       Right lower leg: Normal.       Left lower leg: Normal.  Vitals reviewed.  Assessment/Plan:  Active Problems:   HCAP (healthcare-associated pneumonia)   Asthma   Pulmonary embolism on right [facility])   Mediastinal lymphadenopathy  Pulmonary embolism: CTA demonstrated right-sided PE with RV strain on admission.  Echocardiogram demonstrated no right-sided strain the following day indicating improvement in the  patient's RV function. No evidence of LV dysfunction(diastolic dysfunction) -Patient was being bridged from heparin to Coumadin with INR 1.23 today as per pharmacy consult. This was switched to a Lovenox to coumadin bridge the prior day to be continued on discharge. -Pulmonology indicated patient will need to follow-up with Dr. Lamonte Sakai in 4-6 weeks for bronchoscopy given her recent PE.  -Patient no longer on oxygen, place order to ambulate with O2 sats for activity --Will transfer to telemetry from stepdown today --PT evaluation placed.  Cough Syncope: Patient has been having syncope episodes following coughing spells. She stated that they last for a few minutes and she wakes on the floor or in her chair wherever she had been prior. Cough syncope is a highly recognized illness and is the most likely etiology being described by the patient given her lack of arrhythmia or hypoxia during such events. She has been advised not to drive, or to engage in behavior that places her at increased risk of injury should she cough and syncopize.   HAP: Patient admitted with initial concerns for HAP as she was hospitalized in October but does occur within the appropriate timeframe for healthcare associated pneumonia.  The patient's white blood cell count has remained stable and within normal limits.  She is afebrile, vitals have maintained within normal limits reaching near hypertension of recent. -Patient will complete her seven day course of antibiotics on 12/15 and will not need  to be discharged home on any as per the standard recommendation.   Vaginal bleeding Patient denied worsening vaginal bleeding stating that it is reduced to a mild spotting at this point. We will continue to follow this and consider completing OB/GYN for outpatient IUD removal when discharged. -Hemoglobin stable at 12.2 on 12/12. Bleeding has decreasd. Will repeat if needed.  Constipation: MiraLAX twice daily today evaluate if this is  been effective. We have discontinued this order.  Asthma: Patient continued on her home albuterol inhaler as needed  Dispo: Anticipated discharge in approximately 1-2 day(s).   Kathi Ludwig, MD 01/22/2017, 12:47 PM Pager: Pager# [phone]

## 2017-01-22 NOTE — Discharge Summary (Signed)
Name: Crystal Gallagher MRN: [id number] DOB: January 28, 1981 36 y.o. PCP: Forrest Moron, MD  Date of Admission: 01/17/2017  6:14 AM Date of Discharge: 01/23/2017 Attending Physician: Gilles Chiquito MD  Discharge Diagnosis: Active Problems:   HCAP (healthcare-associated pneumonia)   Asthma   Pulmonary embolism on right [facility])   Mediastinal lymphadenopathy   Cough syncope   Discharge Medications: Allergies as of 01/23/2017   No Known Allergies     Medication List    STOP taking these medications   HYDROcodone-homatropine 5-1.5 MG/5ML syrup Commonly known as:  HYCODAN     TAKE these medications   albuterol 108 (90 Base) MCG/ACT inhaler Commonly known as:  PROVENTIL HFA;VENTOLIN HFA Inhale 1-2 puffs into the lungs every 6 (six) hours as needed for wheezing.   albuterol (5 MG/ML) 0.5% nebulizer solution Commonly known as:  PROVENTIL Take 0.5 mLs (2.5 mg total) by nebulization every 6 (six) hours as needed for wheezing or shortness of breath.   benzonatate 100 MG capsule Commonly known as:  TESSALON Take 1 capsule (100 mg total) by mouth 3 (three) times daily as needed for cough.   enoxaparin 150 MG/ML injection Commonly known as:  LOVENOX Inject 1 mL (150 mg total) into the skin every 12 (twelve) hours.   EXCEDRIN BACK & BODY 250-250 MG tablet Generic drug:  Acetaminophen-Aspirin Buffered Take 1 tablet by mouth every 4 (four) hours as needed for pain.   guaifenesin 100 MG/5ML syrup Commonly known as:  ROBITUSSIN Take 200 mg by mouth 3 (three) times daily as needed for cough.   guaiFENesin-codeine 100-10 MG/5ML syrup Take 5 mLs by mouth every 4 (four) hours as needed for cough.   levonorgestrel 20 MCG/24HR IUD Commonly known as:  MIRENA 1 each by Intrauterine route once.   Lidocaine 4 % Ptch Apply 1 patch topically every 12 (twelve) hours as needed. What changed:  reasons to take this   omeprazole 20 MG capsule Commonly known as:  PRILOSEC Take 1 capsule (20 mg  total) by mouth daily. What changed:  when to take this   warfarin 5 MG tablet Commonly known as:  COUMADIN Take 1 tablet (5 mg total) by mouth daily for 3 doses.            Durable Medical Equipment  (From admission, onward)        Start     Ordered   01/22/17 1551  For home use only DME Nebulizer machine  Once    Question:  Patient needs a nebulizer to treat with the following condition  Answer:  Pneumonia   01/22/17 1551      Disposition and follow-up:   Ms.Crystal Gallagher was discharged from [facility] in Stable condition.  At the hospital follow up visit please address:  1.  Lovenox to coumadin bridge. Needs INR check. Will need to continue following at a coumadin clinic.  She may need assistance finding one associated with her PCP or elsewhere if she does not continue at [facility] coumadin clinic.        2.  Labs / imaging needed at time of follow-up: INR, BMP  3.  Pending labs/ test needing follow-up: n/a  Follow-up Appointments: Follow-up Information    Collene Gobble, MD Follow up.   Specialty:  Pulmonary Disease Why:  appointment scheduled 02/16/2016 at 945 am Contact information: [address]. [place] [address] [phone]           Hospital Course by problem  list: Active Problems:   HCAP (healthcare-associated pneumonia)   Asthma   Pulmonary embolism on right ([place])   Mediastinal lymphadenopathy   Cough syncope   1. Pulmonary embolism: Crystal Gallagher is a 36 year old female who presented to the emergency department with severe shortness of breath and syncope following coughing episodes.  She has had progressive worsening cough since August 2018 and was scheduled for bronchoscopy by Dr. Lamonte Sakai on the day after admission as outpatient.  CT angiogram in the ED revealed a large right-sided pulmonary embolism with right ventricular enlargement noted to be a ratio of 1.7 RV/LV.  She was treated with heparin as per pharmacy consult for  pulmonary embolism remain admitted to the hospital to complete a heparin to warfarin bridge.  This bridge was altered for Lovenox to warfarin bridge 2 days prior to discharge so that the patient may complete the process as an outpatient.  Patient's dyspnea improved during her admission and she was able to wean off oxygen to begin physical therapy to better enable ease of ambulation.  She was made an appointment to follow up in the Internal Medicine Coumadin Clinic on 12/17.  She was discharged with lovenox and coumadin.  2. HCAP CTA with  possible pneumonia prompting treatment with antibiotics for 7-day course.  Patient has been diagnosed with pneumonia multiple times in the past but failed to adequately clear these infections.  A 7-day course of antibiotics was completed prior to discharge.  On day of discharge the patient remained afebrile, normotensive, with normal heart rate and respiratory rate with a return to normal of her white blood cell count.   3. Mediastinal lymphadenopathy: Will follow up with pulmonary in January. She was noted to have this on CTA and originally scheduled for bronchoscopy prior to developing the pulmonary embolism.   4. Cough Syncope: Patient experiences notable cough syncope following coughing spells.  She states that she will begin coughing violently at which point she awakes on the floor or in her chair feeling numb or tingly all over.  This is consistent with cough syncope syndrome and is most likely been acutely worsened by her pulmonary embolism.  The patient was discharged and recommended to obtain dextromethorphan cough suppressant use ibuprofen as needed for pain.  5. [place] No changes were made to her medication regimen regarding her asthma. She was given a script for a nebulizer machine as well as to continue her PRN albuterol inhaler.   Discharge Vitals:   BP (!) 149/93   Pulse 90   Temp 99.1 F (37.3 C) (Oral)   Resp 20   Ht 5\' 6"  (1.676 m)   Wt (!) 333  lb 8.9 oz (151.3 kg)   SpO2 95%   BMI 53.84 kg/m   Pertinent Labs, Studies, and Procedures:  CBC Latest Ref Rng & Units 01/21/2017 01/20/2017 01/19/2017  WBC 4.0 - 10.5 K/uL 9.5 8.8 8.3  Hemoglobin 12.0 - 15.0 g/dL 12.4 12.2 12.3  Hematocrit 36.0 - 46.0 % 36.5 37.9 38.3  Platelets 150 - 400 K/uL 256 258 224    CMP Latest Ref Rng & Units 01/19/2017 01/18/2017 01/17/2017  Glucose 65 - 99 mg/dL 85 101(H) 94  BUN 6 - 20 mg/dL 11 9 10   Creatinine 0.44 - 1.00 mg/dL 0.57 0.67 0.62  Sodium 135 - 145 mmol/L 135 138 136  Potassium 3.5 - 5.1 mmol/L 3.8 3.4(L) 3.6  Chloride 101 - 111 mmol/L 106 106 107  CO2 22 - 32 mmol/L 21(L) 22 18(L)  Calcium 8.9 -  10.3 mg/dL 8.8(L) 9.0 8.9  Total Protein 6.5 - 8.1 g/dL - - -  Total Bilirubin 0.3 - 1.2 mg/dL - - -  Alkaline Phos 38 - 126 U/L - - -  AST 15 - 41 U/L - - -  ALT 14 - 54 U/L - - -   CTA chest: IMPRESSION: Extensive embolic disease within the right pulmonary arterial tree. Evidence of right ventricular strain. Positive for acute PE with CT evidence of right heart strain (RV/LV Ratio = 1.7) consistent with at least submassive (intermediate risk) PE. The presence of right heart strain has been associated with an increased risk of morbidity and mortality. Please activate Code PE by paging [phone].  Worsened consolidation and collapse throughout the left lower lobe. Patchy pneumonia present throughout the left upper lobe.  Bilateral hilar and subcarinal lymphadenopathy, presumably reactive. Cannot rule out the possibility of underlying process such as sarcoid.  Evidence of left ventricular hypertrophy.  Discharge Instructions: Discharge Instructions    Diet - low sodium heart healthy   Complete by:  As directed    Discharge instructions   Complete by:  As directed    Please follow up in the Coumadin Clinic on Monday   Increase activity slowly   Complete by:  As directed       Signed: Valinda Party,  DO 01/23/2017, 12:18 PM   Pager: Pager# [phone]

## 2017-01-22 NOTE — Care Management Note (Signed)
Case Management Note  Patient Details  Name: Crystal Gallagher MRN: [id number] Date of Birth: 1980/02/10  Subjective/Objective:     HCAP, PE                Action/Plan: Discharge Planning: NCM spoke to pt and mother at bedside. States she is working full-time at [employer]. She is currently out on FMLA until 01/25/2017, instructed pt to call her FMLA agency to have extended out. Contacted [facility] for neb machine for home. She was dc home with neb solution but does not have her own machine. Pt has follow up on 12/17 in Internal Medicine Clinic for Coumadin f/u. Scheduled appt to see Pulmonologist on 02/15/2017 at 945 am.   PCP Delia Chimes MD  Expected Discharge Date:                  Expected Discharge Plan:  Home/Self Care  In-House Referral:  NA  Discharge planning Services  CM Consult  Post Acute Care Choice:  NA Choice offered to:  NA  DME Arranged:  Nebulizer machine DME Agency:  [place]:  NA [place] Agency:  NA  Status of Service:  Completed, signed off  If discussed at [place] of Stay Meetings, dates discussed:    Additional Comments:  Erenest Rasher, RN 01/22/2017, 4:03 PM

## 2017-01-23 ENCOUNTER — Other Ambulatory Visit: Payer: Self-pay | Admitting: Internal Medicine

## 2017-01-23 LAB — PROTIME-INR
INR: 1.93
Prothrombin Time: 21.9 seconds — ABNORMAL HIGH (ref 11.4–15.2)

## 2017-01-23 MED ORDER — BENZONATATE 100 MG PO CAPS: 100.0000 mg | ORAL_CAPSULE | Freq: Three times a day (TID) | ORAL | 0 refills | Status: DC | PRN

## 2017-01-23 MED ORDER — GUAIFENESIN-CODEINE 100-10 MG/5ML PO SOLN: 5.0000 mL | ORAL | 0 refills | Status: DC | PRN

## 2017-01-23 MED ORDER — ENOXAPARIN (LOVENOX) PATIENT EDUCATION KIT
PACK | Freq: Once | Status: AC
  Administered 2017-01-23: 14:00:00
  Filled 2017-01-23: qty 1

## 2017-01-23 MED ORDER — WARFARIN SODIUM 5 MG PO TABS: 5.0000 mg | ORAL_TABLET | Freq: Every day | ORAL | 0 refills | Status: DC

## 2017-01-23 MED ORDER — ENOXAPARIN SODIUM 150 MG/ML ~~LOC~~ SOLN: 150.0000 mg | Freq: Two times a day (BID) | SUBCUTANEOUS | 0 refills | Status: DC

## 2017-01-23 NOTE — Progress Notes (Signed)
Pt stated [facility] delivered her nebulizer yesterday evening.

## 2017-01-23 NOTE — Plan of Care (Signed)
Pt resting quietly in bed, SR on monitor. No acute issues at this time.

## 2017-01-23 NOTE — Progress Notes (Signed)
Discharge instructions reviewed with pt. Pt has no questions at this time. Pt taught how to give Lovenox shot using teach back. Pt being discharged home. Pt states she is ready to go home.

## 2017-01-23 NOTE — Progress Notes (Addendum)
   Subjective:  Patient was evaluated this morning.  She states that yesterday she was able to ambulate down the hall and up a few stairs without difficulty.  She states she would like to go home today.  She denies chest pain and improvement in her cough.   Objective:  Vital signs in last 24 hours: Vitals:   01/23/17 0500 01/23/17 0600 01/23/17 0700 01/23/17 0744  BP:    (!) 149/93  Pulse: 86 88 89 90  Resp: (!) 27 (!) 26 20 20   Temp:      TempSrc:      SpO2: 94% 96% 98% 95%  Weight:      Height:       ROS negative except as per HPI.  Physical Exam  Constitutional: She appears well-developed and well-nourished. She does not appear ill.  Cardiovascular: Normal rate, regular rhythm and normal heart sounds. Exam reveals no gallop and no friction rub.  No murmur heard. Pulmonary/Chest: Effort normal. No accessory muscle usage. No tachypnea. No respiratory distress. She has no wheezes.  Abdominal: Soft. Bowel sounds are normal. There is no tenderness.  Musculoskeletal:       Right lower leg: Normal.       Left lower leg: Normal.  Vitals reviewed.  Assessment/Plan:  Active Problems:   HCAP (healthcare-associated pneumonia)   Asthma   Pulmonary embolism on right [facility])   Mediastinal lymphadenopathy   Cough syncope  Pulmonary embolism: CTA demonstrated right-sided PE with RV strain on admission.  Patient was first bridged with heparin to Coumadin and then switched to lovenox recently to help discharge patient.  Her INR is 1.9 today. -discharging with lovenox and coumadin, to follow up on 12/17 in coumadin clinic -Pulmonology indicated patient will need to follow-up with Dr. Lamonte Sakai in 4-6 weeks for bronchoscopy given her recent PE.  - No further PT recommendations  Cough Syncope: Patient has been having syncope episodes following coughing spells. None in the past 72 hrs.  She has been advised not to drive, or to engage in behavior that places her at increased risk of injury should  she cough and syncopize.  - discharge with cough syrup  HAP: Patient will complete her seven day course of antibiotics on 12/15   Asthma: Patient continued on her home albuterol inhaler as needed  Dispo: Anticipated discharge today.   Kalman Shan [place], DO 01/23/2017, 8:33 AM Pager: Pager# [phone]

## 2017-01-25 ENCOUNTER — Ambulatory Visit (INDEPENDENT_AMBULATORY_CARE_PROVIDER_SITE_OTHER): Payer: 59 | Admitting: Pharmacist

## 2017-01-25 DIAGNOSIS — I2699 Other pulmonary embolism without acute cor pulmonale: Secondary | ICD-10-CM | POA: Diagnosis not present

## 2017-01-25 DIAGNOSIS — Z7901 Long term (current) use of anticoagulants: Secondary | ICD-10-CM

## 2017-01-25 LAB — POCT INR: INR: 1.9

## 2017-01-25 MED ORDER — WARFARIN SODIUM 5 MG PO TABS: ORAL_TABLET | ORAL | 0 refills | Status: DC

## 2017-01-25 NOTE — Progress Notes (Signed)
Anticoagulation Management Crystal Gallagher is a 36 y.o. female who reports to the clinic for monitoring of warfarin treatment.    Indication: PE  Duration: 1 year Supervising physician: [facility] Visit History: Patient does not report signs/symptoms of bleeding or thromboembolism  Other recent changes: No diet, medications, lifestyle endorsed by the patient to me at this visit.  Anticoagulation Episode Summary    Current INR goal:   2.0-3.0  TTR:   -  Next INR check:   01/28/2017  INR from last check:   1.90! (01/25/2017)  Weekly max warfarin dose:     Target end date:   02/01/2017  INR check location:     Preferred lab:     Send INR reminders to:      Indications   Pulmonary embolism on right [facility]) [I26.99]       Comments:           No Known Allergies Prior to Admission medications   Medication Sig Start Date End Date Taking? Authorizing Provider  albuterol (PROVENTIL HFA;VENTOLIN HFA) 108 (90 Base) MCG/ACT inhaler Inhale 1-2 puffs into the lungs every 6 (six) hours as needed for wheezing. 10/30/16  Yes Tanna Furry, MD  albuterol (PROVENTIL) (5 MG/ML) 0.5% nebulizer solution Take 0.5 mLs (2.5 mg total) by nebulization every 6 (six) hours as needed for wheezing or shortness of breath. 11/10/16  Yes Hedges, Dellis Filbert, PA-C  benzonatate (TESSALON) 100 MG capsule Take 1 capsule (100 mg total) by mouth 3 (three) times daily as needed for cough. 01/23/17  Yes Hoffman, Jessica Ratliff, DO  enoxaparin (LOVENOX) 150 MG/ML injection Inject 1 mL (150 mg total) into the skin every 12 (twelve) hours. 01/23/17  Yes Hoffman, Jessica Ratliff, DO  guaifenesin (ROBITUSSIN) 100 MG/5ML syrup Take 200 mg by mouth 3 (three) times daily as needed for cough.   Yes [provider]  guaiFENesin-codeine 100-10 MG/5ML syrup Take 5 mLs by mouth every 4 (four) hours as needed for cough. 01/23/17  Yes Hoffman, Elza Rafter, DO  levonorgestrel (MIRENA) 20 MCG/24HR IUD 1  each by Intrauterine route once.   Yes [provider]  Lidocaine 4 % PTCH Apply 1 patch topically every 12 (twelve) hours as needed. Patient taking differently: Apply 1 patch topically every 12 (twelve) hours as needed (for pain.).  01/05/17  Yes Stallings, Zoe A, MD  omeprazole (PRILOSEC) 20 MG capsule Take 1 capsule (20 mg total) by mouth daily. Patient taking differently: Take 20 mg by mouth 2 (two) times daily before a meal.  01/04/17  Yes Delia Chimes A, MD  warfarin (COUMADIN) 5 MG tablet Take 2 tablets on Monday, 1 & 1/2 tablets on Tuesday, 1 tablet on Wednesday. 01/25/17  Yes Pennie Banter, RPH-CPP   Past Medical History:  Diagnosis Date  . Asthma   . Pneumonia    Social History   Socioeconomic History  . Marital status: Single    Spouse name: Not on file  . Number of children: Not on file  . Years of education: Not on file  . Highest education level: Not on file  Social Needs  . Financial resource strain: Not on file  . Food insecurity - worry: Not on file  . Food insecurity - inability: Not on file  . Transportation needs - medical: Not on file  . Transportation needs - non-medical: Not on file  Occupational History  . Not on file  Tobacco Use  . Smoking status: Never Smoker  . Smokeless  tobacco: Never Used  Substance and Sexual Activity  . Alcohol use: No    Comment: occasionally  . Drug use: No  . Sexual activity: Not on file  Other Topics Concern  . Not on file  Social History Narrative  . Not on file   Family History  Problem Relation Age of Onset  . Cancer Other   . Hypertension Other   . Arthritis Mother   . Colon cancer Father   . Breast cancer Maternal Aunt   . Colon cancer Paternal Uncle   . Stroke Maternal Grandmother   . Cancer Maternal Grandfather   . Hypertension Maternal Grandfather   . Alzheimer's disease Paternal Grandmother   . Breast cancer Maternal Aunt     ASSESSMENT Recent Results: The most recent result is  correlated with 35 mg per week: Lab Results  Component Value Date   INR 1.90 01/25/2017   INR 1.93 01/23/2017   INR 1.64 01/22/2017    Anticoagulation Dosing: Description   Will continue LMWH bridge therapy for an additional 36h with concomitant warfarin dose (increased) for 3 days. Will see her PCP on the 4th day, Thursday 20-DEC-18 in the morning she states (has an appointment with her PCP).      INR today: Subtherapeutic  PLAN Weekly dose was increased as documented.   Patient Instructions  Patient instructed to take medications as defined in the Anti-coagulation Track section of this encounter.  Patient instructed to take today's dose.  Patient instructed to take 2 of your 5mg  peach-colored warfarin tablets today (Monday); take 1 & 1/2 tablets on Tuesday; take 1 tablet on Wednesday. Continue Lovenox syringes "shots", administering every 12 hours for the next 36 hours. Administer at level of the waistband, 2 inches away from your "belly-button", rotating sites (left-right-left, etc.) Patient verbalized understanding of these instructions.     Patient advised to contact clinic or seek medical attention if signs/symptoms of bleeding or thromboembolism occur.  Patient verbalized understanding by repeating back information and was advised to contact me if further medication-related questions arise. Patient was also provided an information handout.  Follow-up Return in 3 days (on 01/28/2017) for Follow up INR at [place], PharmD, CACP, CPP  30 minutes spent face-to-face with the patient during the encounter. 80% of time spent on education.20% of time was spent on fingerstick point of care INR sample collection, processing, results determination, dose adjustment and documentation in [email or web address].

## 2017-01-25 NOTE — Patient Instructions (Signed)
Patient instructed to take medications as defined in the Anti-coagulation Track section of this encounter.  Patient instructed to take today's dose.  Patient instructed to take 2 of your 5mg  peach-colored warfarin tablets today (Monday); take 1 & 1/2 tablets on Tuesday; take 1 tablet on Wednesday. Continue Lovenox syringes "shots", administering every 12 hours for the next 36 hours. Administer at level of the waistband, 2 inches away from your "belly-button", rotating sites (left-right-left, etc.) Patient verbalized understanding of these instructions.

## 2017-01-25 NOTE — Progress Notes (Signed)
Inpt in Dec for PE on IM service and started on warfarin. Dr Nolon Rod is PCP. PT will either need to change PCP to [facility] to cont in anti-coag clinic or move warfarin mgmt to another center.

## 2017-01-28 ENCOUNTER — Other Ambulatory Visit: Payer: Self-pay

## 2017-01-28 ENCOUNTER — Encounter: Payer: Self-pay | Admitting: Family Medicine

## 2017-01-28 ENCOUNTER — Ambulatory Visit (INDEPENDENT_AMBULATORY_CARE_PROVIDER_SITE_OTHER): Payer: 59 | Admitting: Pharmacist

## 2017-01-28 ENCOUNTER — Ambulatory Visit: Payer: 59 | Admitting: Family Medicine

## 2017-01-28 VITALS — BP 116/85 | HR 93 | Temp 99.2°F | Resp 18 | Ht 66.0 in | Wt 329.0 lb

## 2017-01-28 DIAGNOSIS — Z7901 Long term (current) use of anticoagulants: Secondary | ICD-10-CM

## 2017-01-28 DIAGNOSIS — I2699 Other pulmonary embolism without acute cor pulmonale: Secondary | ICD-10-CM

## 2017-01-28 DIAGNOSIS — Z5181 Encounter for therapeutic drug level monitoring: Secondary | ICD-10-CM | POA: Diagnosis not present

## 2017-01-28 DIAGNOSIS — Z09 Encounter for follow-up examination after completed treatment for conditions other than malignant neoplasm: Secondary | ICD-10-CM

## 2017-01-28 LAB — POCT INR: INR: 2.2

## 2017-01-28 MED ORDER — RIVAROXABAN (XARELTO) VTE STARTER PACK (15 & 20 MG): ORAL_TABLET | ORAL | 0 refills | Status: DC

## 2017-01-28 NOTE — Patient Instructions (Signed)
Patient instructed to DISCONTINUE warfarin.  Patient instructed to commence [place] with pre-printed instructions that read:  Take one (1) of the 15mg  strength Xarelto tablets, twice-daily, with food for 21 days; On day 22, commence taking only one (1) of the 20mg  strength Xarelto tablets, once-daily, with food for remainder of Xarelto Starter Pak. At that time, your primary care physician Dr. Delia Chimes will be responsible for providing all subsequent refills. You should be on Xarelto for minimally 6 months.  Patient verbalized understanding of these instructions.

## 2017-01-28 NOTE — Progress Notes (Signed)
Chief Complaint  Patient presents with  . 3 week f/u    community acquired pneumonia    HPI   Pt reports that she has pain in her legs and groin  She states that she also has upper back pain on the left side She reports that she has been taking excedrin for the pain which is not helping She states that her breathing has improved since discharge from the hospital She was discharged home with a nebulizer but has not had to use it  She states that she coughed up a clot yesterday about the size of a penny She has been started on Coumadin for her [place] Pt presented to ED with SOB and syncope following cough episodes She was scheduled for bronchoscopy the day of her presentation to the ED She had CTA done that revealed a large right sided PE with right ventricular enlargement She was started on heparin with lovenox to bridge to Coumadin. She reports that she had [place] to r/o LE dvt and those were negative   4 review of systems  Past Medical History:  Diagnosis Date  . Asthma   . Pneumonia     Current Outpatient Medications  Medication Sig Dispense Refill  . albuterol (PROVENTIL HFA;VENTOLIN HFA) 108 (90 Base) MCG/ACT inhaler Inhale 1-2 puffs into the lungs every 6 (six) hours as needed for wheezing. 1 Inhaler 0  . albuterol (PROVENTIL) (5 MG/ML) 0.5% nebulizer solution Take 0.5 mLs (2.5 mg total) by nebulization every 6 (six) hours as needed for wheezing or shortness of breath. 20 mL 12  . benzonatate (TESSALON) 100 MG capsule Take 1 capsule (100 mg total) by mouth 3 (three) times daily as needed for cough. 20 capsule 0  . levonorgestrel (MIRENA) 20 MCG/24HR IUD 1 each by Intrauterine route once.    Marland Kitchen omeprazole (PRILOSEC) 20 MG capsule Take 1 capsule (20 mg total) by mouth daily. (Patient taking differently: Take 20 mg by mouth 2 (two) times daily before a meal. ) 30 capsule 3  . guaifenesin (ROBITUSSIN) 100 MG/5ML syrup Take 200 mg by mouth 3 (three) times daily as  needed for cough.    Marland Kitchen guaiFENesin-codeine 100-10 MG/5ML syrup Take 5 mLs by mouth every 4 (four) hours as needed for cough. 120 mL 0  . Lidocaine 4 % PTCH Apply 1 patch topically every 12 (twelve) hours as needed. 30 patch 1  . Rivaroxaban (XARELTO STARTER PACK) 15 & 20 MG TBPK Start with one 15mg  tablet by mouth twice daily with food. On Day 22, switch to one 20mg  tablet daily with food. 51 each 0   No current facility-administered medications for this visit.     Allergies: No Known Allergies  Past Surgical History:  Procedure Laterality Date  . APPENDECTOMY    . BREAST SURGERY  breast reduction    Social History   Socioeconomic History  . Marital status: Single    Spouse name: None  . Number of children: None  . Years of education: None  . Highest education level: None  Social Needs  . Financial resource strain: None  . Food insecurity - worry: None  . Food insecurity - inability: None  . Transportation needs - medical: None  . Transportation needs - non-medical: None  Occupational History  . None  Tobacco Use  . Smoking status: Never Smoker  . Smokeless tobacco: Never Used  Substance and Sexual Activity  . Alcohol use: No    Comment: occasionally  . Drug  use: No  . Sexual activity: None  Other Topics Concern  . None  Social History Narrative  . None    Family History  Problem Relation Age of Onset  . Cancer Other   . Hypertension Other   . Arthritis Mother   . Colon cancer Father   . Breast cancer Maternal Aunt   . Colon cancer Paternal Uncle   . Stroke Maternal Grandmother   . Cancer Maternal Grandfather   . Hypertension Maternal Grandfather   . Alzheimer's disease Paternal Grandmother   . Breast cancer Maternal Aunt      ROS Review of Systems See HPI Constitution: No fevers or chills No malaise No diaphoresis Skin: No rash or itching Eyes: no blurry vision, no double vision GU: no dysuria or hematuria Neuro: no dizziness or headaches  all  others reviewed and negative   Objective: Vitals:   01/28/17 0930  BP: 116/85  Pulse: 93  Resp: 18  Temp: 99.2 F (37.3 C)  TempSrc: Oral  SpO2: 93%  Weight: (!) 329 lb (149.2 kg)  Height: 5\' 6"  (1.676 m)    Physical Exam  Constitutional: She is oriented to person, place, and time. She appears well-developed and well-nourished.  HENT:  Head: Normocephalic and atraumatic.  Eyes: Conjunctivae and EOM are normal.  Cardiovascular: Normal rate, regular rhythm and normal heart sounds.  No murmur heard. Pulmonary/Chest: Effort normal and breath sounds normal. No stridor. No respiratory distress.  Musculoskeletal: Normal range of motion. She exhibits no edema.  Neurological: She is alert and oriented to person, place, and time.  Skin: Skin is warm. Capillary refill takes less than 2 seconds.    Assessment and Plan Alfrieda was seen today for 3 week f/u.  Diagnoses and all orders for this visit:  Encounter for medication monitoring -     Basic metabolic panel  Right pulmonary embolus ([place])- will look for causes of hypercoagulable states -     Basic metabolic panel -     Lupus (SLE) Analysis -     ANA w/Reflex if Positive -     Anti-DNA antibody, double-stranded -     Anti-Smith antibody -     Cardiolipin antibodies, IgG, IgM, IgA  Anticoagulated on Coumadin- pt to follow up in the coumadin clinic -     Basic metabolic panel  Hospital discharge follow-up- reviewed hospital course with pt     [place]

## 2017-01-28 NOTE — Addendum Note (Signed)
Addended by: Jorene Guest B on: 01/28/2017 02:51 PM   Modules accepted: Orders, Level of Service

## 2017-01-28 NOTE — Progress Notes (Signed)
Anticoagulation Management Crystal Gallagher is Gallagher 36 y.o. female who reports to the clinic for monitoring of warfarin treatment.    Indication: PE  Duration: 6 months Supervising physician: Crystal Gallagher  Anticoagulation Clinic Visit History: Patient does not report signs/symptoms of bleeding or thromboembolism  Other recent changes: No diet, medications, lifestyle changes endorsed by the patient to me at this visit.  Anticoagulation Episode Summary    Current INR goal:   2.0-3.0  TTR:   -  Next INR check:   02/04/2017  INR from last check:   2.2 (01/28/2017)  Weekly max warfarin dose:     Target end date:   02/01/2017  INR check location:     Preferred lab:     Send INR reminders to:      Indications   Pulmonary embolism on right Crystal Gallagher) [I26.99]       Comments:           No Known Allergies Prior to Admission medications   Medication Sig Start Date End Date Taking? Authorizing Provider  albuterol (PROVENTIL HFA;VENTOLIN HFA) 108 (90 Base) MCG/ACT inhaler Inhale 1-2 puffs into the lungs every 6 (six) hours as needed for wheezing. 10/30/16  Yes Crystal Furry, MD  albuterol (PROVENTIL) (5 MG/ML) 0.5% nebulizer solution Take 0.5 mLs (2.5 mg total) by nebulization every 6 (six) hours as needed for wheezing or shortness of breath. 11/10/16  Yes Hedges, Dellis Filbert, PA-C  benzonatate (TESSALON) 100 MG capsule Take 1 capsule (100 mg total) by mouth 3 (three) times daily as needed for cough. 01/23/17  Yes Hoffman, Jessica Ratliff, DO  guaifenesin (ROBITUSSIN) 100 MG/5ML syrup Take 200 mg by mouth 3 (three) times daily as needed for cough.   Yes [provider]  guaiFENesin-codeine 100-10 MG/5ML syrup Take 5 mLs by mouth every 4 (four) hours as needed for cough. 01/23/17  Yes Hoffman, Elza Rafter, DO  levonorgestrel (MIRENA) 20 MCG/24HR IUD 1 each by Intrauterine route once.   Yes [provider]  Lidocaine 4 % PTCH Apply 1 patch topically every 12 (twelve) hours as needed.  01/05/17  Yes Crystal Moron, MD  omeprazole (PRILOSEC) 20 MG capsule Take 1 capsule (20 mg total) by mouth daily. Patient taking differently: Take 20 mg by mouth 2 (two) times daily before Gallagher meal.  01/04/17  Yes Stallings, Zoe A, MD  Rivaroxaban (XARELTO STARTER PACK) 15 & 20 MG TBPK Start with one 15mg  tablet by mouth twice daily with food. On Day 22, switch to one 20mg  tablet daily with food. 01/28/17  Yes Crystal Groves, DO   Past Medical History:  Diagnosis Date  . Asthma   . Pneumonia    Social History   Socioeconomic History  . Marital status: Single    Spouse name: Not on file  . Number of children: Not on file  . Years of education: Not on file  . Highest education level: Not on file  Social Needs  . Financial resource strain: Not on file  . Food insecurity - worry: Not on file  . Food insecurity - inability: Not on file  . Transportation needs - medical: Not on file  . Transportation needs - non-medical: Not on file  Occupational History  . Not on file  Tobacco Use  . Smoking status: Never Smoker  . Smokeless tobacco: Never Used  Substance and Sexual Activity  . Alcohol use: No    Comment: occasionally  . Drug use: No  . Sexual activity: Not on file  Other Topics Concern  . Not on file  Social History Narrative  . Not on file   Family History  Problem Relation Age of Onset  . Cancer Other   . Hypertension Other   . Arthritis Mother   . Colon cancer Father   . Breast cancer Maternal Aunt   . Colon cancer Paternal Uncle   . Stroke Maternal Grandmother   . Cancer Maternal Grandfather   . Hypertension Maternal Grandfather   . Alzheimer's disease Paternal Grandmother   . Breast cancer Maternal Aunt     ASSESSMENT Recent Results: The most recent result is correlated with 10mg  7.5mg , 5mg  over past 3 days with continued LMWH Bridge Therapy for 36 hours.  Lab Results  Component Value Date   INR 2.2 01/28/2017   INR 1.90 01/25/2017   INR 1.93 01/23/2017     Anticoagulation Dosing: Description   Patient with INR < 3.0. Elects to commence upon rivaroxaban/Xarelto Starter Pak. Next RTC visit will be with her PCP.      INR today: Therapeutic  PLAN Patient elects to DISCONTINUE warfarin + LMWH bridge therapy (INR being therapeutic today, 2.2 (<3.0 threshold for which DOAC/rivaroxaban/Xarelto can be started).  Patient elects to commence rivaroxaban/Xarelto with [place]agher starter pak that will see patient taking 15mg  PO  With food BID x 21 days; Day 22, she will commence taking 20mg  PO with food for remaining length of starter pak. Patient was apprised that she must take with food, must remain on rivaroxaban/Xarelto for minimally 6 months, and--that her PCP Dr. Nolon Gallagher will be responsible for all subsequent refills and medical care--this being her last post-hospital follow up visit in our clinic.   Patient Instructions  Patient instructed to DISCONTINUE warfarin.  Patient instructed to commence [place] with pre-printed instructions that read:  Take one (1) of the 15mg  strength Xarelto tablets, twice-daily, with food for 21 days; On day 22, commence taking only one (1) of the 20mg  strength Xarelto tablets, once-daily, with food for remainder of Xarelto Starter Pak. At that time, your primary care physician Dr. Delia Gallagher will be responsible for providing all subsequent refills. You should be on Xarelto for minimally 6 months.  Patient verbalized understanding of these instructions.     Patient advised to contact clinic or seek medical attention if signs/symptoms of bleeding or thromboembolism occur.  Patient verbalized understanding by repeating back information and was advised to contact me if further medication-related questions arise. Patient was also provided an information handout.  Follow-up Return if symptoms worsen or fail to improve.  Pennie Banter, PharmD, CACP, CPP  15 minutes spent face-to-face with the patient during the  encounter. 80% of time spent on education. 20% of time was spent on fingerstick point of care INR sample collection, processing, results determination, discussion with attending physician and patient as well as documentation in Epic/CHL. Marland Kitchen

## 2017-01-28 NOTE — Patient Instructions (Signed)
     IF you received an x-ray today, you will receive an invoice from [facility]. Please contact [facility] at [phone] with questions or concerns regarding your invoice.   IF you received labwork today, you will receive an invoice from [facility]. Please contact [facility] at [phone] with questions or concerns regarding your invoice.   Our billing staff will not be able to assist you with questions regarding bills from these companies.  You will be contacted with the lab results as soon as they are available. The fastest way to get your results is to activate your My Chart account. Instructions are located on the last page of this paperwork. If you have not heard from us regarding the results in 2 weeks, please contact this office.     

## 2017-01-29 LAB — BASIC METABOLIC PANEL
BUN / CREAT RATIO: 22 (ref 9–23)
BUN: 13 mg/dL (ref 6–20)
CO2: 20 mmol/L (ref 20–29)
CREATININE: 0.58 mg/dL (ref 0.57–1.00)
Calcium: 9 mg/dL (ref 8.7–10.2)
Chloride: 106 mmol/L (ref 96–106)
GFR, EST AFRICAN AMERICAN: 135 mL/min/{1.73_m2} (ref >59)
GFR, EST NON AFRICAN AMERICAN: 117 mL/min/{1.73_m2} (ref >59)
GLUCOSE: 84 mg/dL (ref 65–99)
Potassium: 4.1 mmol/L (ref 3.5–5.2)
Sodium: 140 mmol/L (ref 134–144)

## 2017-01-29 NOTE — Progress Notes (Signed)
INTERNAL MEDICINE TEACHING ATTENDING ADDENDUM - Crystal Groves, DO Duration- at least 6 months, would consider indefinate A/C Lab Results  Component Value Date   INR 2.2 01/28/2017  . Agree with pharmacy recommendations as outlined in their note. I also discussed with Crystal Gallagher and reviewed the CTA, echo, pulmonology and internal medicine notes from her recent hospitalization.  She did have a large PE with concern for Right heart strain on CT, was placed on heparin.  By the next morning echo did not show right heart strain and PA pressures had normalized.  She was transitioned to lovenox and warfarin.  She is now therapeutic however is not enjoying the restrictions and frequent visits that warfarin dosing requires.  Overall I think she would be an acceptable candidate for A/C with a DOAC, only limitation is DOACs are not well studied in the morbidly obese patient population, otherwise she is comfortable with this plan and can follow up with her PCP Crystal Nolon Rod for further refills.

## 2017-01-31 LAB — LUPUS (SLE) ANALYSIS
ANTI-STRIATION ABS: NEGATIVE
Anti Nuclear Antibody(ANA): NEGATIVE
COMPLEMENT C4, SERUM: 45 mg/dL — AB (ref 14–44)
ENA RNP Ab: 0.2 AI (ref 0.0–0.9)
ENA SM Ab Ser-aCnc: <0.2 AI (ref 0.0–0.9)
MITOCHONDRIAL AB: 3.7 U (ref 0.0–20.0)
Parietal Cell Ab: 11.4 Units (ref 0.0–20.0)
SMOOTH MUSCLE AB: 6 U (ref 0–19)
THYROID PEROXIDASE ANTIBODY: 8 [IU]/mL (ref 0–34)
dsDNA Ab: <1 IU/mL (ref 0–9)

## 2017-01-31 LAB — CARDIOLIPIN ANTIBODIES, IGG, IGM, IGA: Anticardiolipin IgG: <9 GPL U/mL (ref 0–14)

## 2017-02-01 ENCOUNTER — Other Ambulatory Visit: Payer: Self-pay

## 2017-02-01 ENCOUNTER — Encounter (HOSPITAL_COMMUNITY): Payer: Self-pay | Admitting: *Deleted

## 2017-02-01 ENCOUNTER — Emergency Department (HOSPITAL_COMMUNITY)
Admission: EM | Admit: 2017-02-01 | Discharge: 2017-02-01 | Disposition: A | Discharge status: Home / Self Care | Payer: 59 | Attending: Emergency Medicine | Admitting: Emergency Medicine

## 2017-02-01 DIAGNOSIS — N939 Abnormal uterine and vaginal bleeding, unspecified: Secondary | ICD-10-CM | POA: Diagnosis present

## 2017-02-01 DIAGNOSIS — Z5321 Procedure and treatment not carried out due to patient leaving prior to being seen by health care provider: Secondary | ICD-10-CM | POA: Diagnosis not present

## 2017-02-01 LAB — CBC
HEMATOCRIT: 39.3 % (ref 36.0–46.0)
Hemoglobin: 12.9 g/dL (ref 12.0–15.0)
MCH: 28.6 pg (ref 26.0–34.0)
MCHC: 32.8 g/dL (ref 30.0–36.0)
MCV: 87.1 fL (ref 78.0–100.0)
PLATELETS: 365 10*3/uL (ref 150–400)
RBC: 4.51 MIL/uL (ref 3.87–5.11)
RDW: 14.1 % (ref 11.5–15.5)
WBC: 10.5 10*3/uL (ref 4.0–10.5)

## 2017-02-01 LAB — BASIC METABOLIC PANEL
Anion gap: 7 (ref 5–15)
BUN: 10 mg/dL (ref 6–20)
CO2: 22 mmol/L (ref 22–32)
CREATININE: 0.59 mg/dL (ref 0.44–1.00)
Calcium: 9.2 mg/dL (ref 8.9–10.3)
Chloride: 110 mmol/L (ref 101–111)
GFR calc non Af Amer: >60 mL/min (ref >60)
Glucose, Bld: 110 mg/dL — ABNORMAL HIGH (ref 65–99)
Potassium: 3.8 mmol/L (ref 3.5–5.1)
Sodium: 139 mmol/L (ref 135–145)

## 2017-02-01 NOTE — ED Notes (Signed)
Pt called in the lobby, no response.

## 2017-02-01 NOTE — ED Notes (Signed)
Pt called for vitals reassessment, no answer

## 2017-02-01 NOTE — ED Triage Notes (Addendum)
Pt in c/o recent dx of blood clot and started Xerelto this Thursday prior to Thursday the pt was Warfarin, pt reports on Saturday having large blood clots passing from vagina, pt states, "I change a pad every hour and a half with nice size clots. There is so much blood I don't know where it is coming from." c/o SOB, pt A&O x4, pt c/o syncopal episodes with coughing over the last 4 mths for 4 secs and this is her baseline

## 2017-02-04 ENCOUNTER — Emergency Department (HOSPITAL_COMMUNITY)
Admission: EM | Admit: 2017-02-04 | Discharge: 2017-02-04 | Disposition: A | Discharge status: Home / Self Care | Payer: 59 | Attending: Emergency Medicine | Admitting: Emergency Medicine

## 2017-02-04 ENCOUNTER — Ambulatory Visit: Payer: 59 | Admitting: Family Medicine

## 2017-02-04 ENCOUNTER — Telehealth: Payer: Self-pay | Admitting: Family Medicine

## 2017-02-04 ENCOUNTER — Other Ambulatory Visit: Payer: Self-pay

## 2017-02-04 ENCOUNTER — Encounter: Payer: Self-pay | Admitting: Family Medicine

## 2017-02-04 ENCOUNTER — Encounter (HOSPITAL_COMMUNITY): Payer: Self-pay

## 2017-02-04 VITALS — BP 132/90 | HR 118 | Temp 99.2°F | Resp 18 | Ht 66.0 in | Wt 321.2 lb

## 2017-02-04 DIAGNOSIS — R05 Cough: Secondary | ICD-10-CM | POA: Diagnosis not present

## 2017-02-04 DIAGNOSIS — I2699 Other pulmonary embolism without acute cor pulmonale: Secondary | ICD-10-CM

## 2017-02-04 DIAGNOSIS — M7918 Myalgia, other site: Secondary | ICD-10-CM | POA: Insufficient documentation

## 2017-02-04 DIAGNOSIS — N939 Abnormal uterine and vaginal bleeding, unspecified: Secondary | ICD-10-CM

## 2017-02-04 DIAGNOSIS — J45909 Unspecified asthma, uncomplicated: Secondary | ICD-10-CM | POA: Diagnosis not present

## 2017-02-04 DIAGNOSIS — R55 Syncope and collapse: Secondary | ICD-10-CM

## 2017-02-04 DIAGNOSIS — Z7901 Long term (current) use of anticoagulants: Secondary | ICD-10-CM

## 2017-02-04 DIAGNOSIS — M545 Low back pain: Secondary | ICD-10-CM | POA: Diagnosis not present

## 2017-02-04 DIAGNOSIS — R590 Localized enlarged lymph nodes: Secondary | ICD-10-CM

## 2017-02-04 DIAGNOSIS — R531 Weakness: Secondary | ICD-10-CM | POA: Diagnosis not present

## 2017-02-04 LAB — CBC
HCT: 40.6 % (ref 36.0–46.0)
HEMOGLOBIN: 13.4 g/dL (ref 12.0–15.0)
MCH: 28.6 pg (ref 26.0–34.0)
MCHC: 33 g/dL (ref 30.0–36.0)
MCV: 86.6 fL (ref 78.0–100.0)
Platelets: 379 10*3/uL (ref 150–400)
RBC: 4.69 MIL/uL (ref 3.87–5.11)
RDW: 14.1 % (ref 11.5–15.5)
WBC: 12.3 10*3/uL — ABNORMAL HIGH (ref 4.0–10.5)

## 2017-02-04 LAB — URINALYSIS, MICROSCOPIC (REFLEX)

## 2017-02-04 LAB — URINALYSIS, ROUTINE W REFLEX MICROSCOPIC

## 2017-02-04 LAB — POCT CBC
Granulocyte percent: 85.1 %G — AB (ref 37–80)
HCT, POC: 41 % (ref 37.7–47.9)
HEMOGLOBIN: 13.3 g/dL (ref 12.2–16.2)
Lymph, poc: 1.4 (ref 0.6–3.4)
MCH: 28 pg (ref 27–31.2)
MCHC: 32.5 g/dL (ref 31.8–35.4)
MCV: 86.1 fL (ref 80–97)
MID (CBC): 0.3 (ref 0–0.9)
MPV: 8.3 fL (ref 0–99.8)
PLATELET COUNT, POC: 361 10*3/uL (ref 142–424)
POC Granulocyte: 9.4 — AB (ref 2–6.9)
POC LYMPH PERCENT: 12.6 %L (ref 10–50)
POC MID %: 2.3 %M (ref 0–12)
RBC: 4.76 M/uL (ref 4.04–5.48)
RDW, POC: 14.3 %
WBC: 11.1 10*3/uL — AB (ref 4.6–10.2)

## 2017-02-04 LAB — COMPREHENSIVE METABOLIC PANEL
ALBUMIN: 3.7 g/dL (ref 3.5–5.0)
ALK PHOS: 302 U/L — AB (ref 38–126)
ALT: 32 U/L (ref 14–54)
ANION GAP: 12 (ref 5–15)
AST: 27 U/L (ref 15–41)
BUN: 9 mg/dL (ref 6–20)
CALCIUM: 9.3 mg/dL (ref 8.9–10.3)
CHLORIDE: 104 mmol/L (ref 101–111)
CO2: 20 mmol/L — AB (ref 22–32)
CREATININE: 0.62 mg/dL (ref 0.44–1.00)
GFR calc Af Amer: >60 mL/min (ref >60)
GFR calc non Af Amer: >60 mL/min (ref >60)
GLUCOSE: 104 mg/dL — AB (ref 65–99)
Potassium: 3.5 mmol/L (ref 3.5–5.1)
SODIUM: 136 mmol/L (ref 135–145)
Total Bilirubin: 0.6 mg/dL (ref 0.3–1.2)
Total Protein: 7.1 g/dL (ref 6.5–8.1)

## 2017-02-04 LAB — LIPASE, BLOOD: LIPASE: 16 U/L (ref 11–51)

## 2017-02-04 LAB — I-STAT BETA HCG BLOOD, ED (MC, WL, AP ONLY)

## 2017-02-04 MED ORDER — ACETAMINOPHEN 500 MG PO TABS
1000.0000 mg | ORAL_TABLET | Freq: Once | ORAL | Status: AC
  Administered 2017-02-04: 1000 mg via ORAL
  Filled 2017-02-04: qty 2

## 2017-02-04 MED ORDER — SODIUM CHLORIDE 0.9 % IV BOLUS (SEPSIS)
2000.0000 mL | Freq: Once | INTRAVENOUS | Status: AC
  Administered 2017-02-04: 2000 mL via INTRAVENOUS

## 2017-02-04 NOTE — ED Notes (Signed)
This tech walked into pt's room to find pt sitting on side of bed. There was 7cm size blood clot on the floor along with more blood on the floor. Pt stated she called out several times for help to use restroom and stated she threw up from coughing so hard. Helped wash pt up, the bedding was completely saturated with blood where pt was sitting. Nurse was notified of situation.

## 2017-02-04 NOTE — ED Notes (Signed)
Pt getting dressed, mother at bedside

## 2017-02-04 NOTE — Telephone Encounter (Signed)
Forwarded to you as information only .    Pt of Dr. Nolon Rod she saw this morning for vaginal bleeding.   She is in the ED now.

## 2017-02-04 NOTE — Patient Instructions (Addendum)
Follow up to the ER at [place] already notified them that you were coming by private vehicle   IF you received an x-ray today, you will receive an invoice from [facility]. Please contact [facility] at [phone] with questions or concerns regarding your invoice.   IF you received labwork today, you will receive an invoice from [facility]. Please contact [facility] at [phone] with questions or concerns regarding your invoice.   Our billing staff will not be able to assist you with questions regarding bills from these companies.  You will be contacted with the lab results as soon as they are available. The fastest way to get your results is to activate your My Chart account. Instructions are located on the last page of this paperwork. If you have not heard from [place] regarding the results in 2 weeks, please contact this office.     Dysfunctional Uterine Bleeding Dysfunctional uterine bleeding is abnormal bleeding from the uterus. Dysfunctional uterine bleeding includes:  A period that comes earlier or later than usual.  A period that is lighter, heavier, or has blood clots.  Bleeding between periods.  Skipping one or more periods.  Bleeding after sexual intercourse.  Bleeding after menopause.  Follow these instructions at home: Pay attention to any changes in your symptoms. Follow these instructions to help with your condition: Eating and drinking  Eat well-balanced meals. Include foods that are high in iron, such as liver, meat, shellfish, green leafy vegetables, and eggs.  If you become constipated: ? Drink plenty of water. ? Eat fruits and vegetables that are high in water and fiber, such as spinach, carrots, raspberries, apples, and mango. Medicines  Take over-the-counter and prescription medicines only as told by your health care provider.  Do not change medicines without talking with your health care provider.  Aspirin or medicines that  contain aspirin may make the bleeding worse. Do not take those medicines: ? During the week before your period. ? During your period.  If you were prescribed iron pills, take them as told by your health care provider. Iron pills help to replace iron that your body loses because of this condition. Activity  If you need to change your sanitary pad or tampon more than one time every 2 hours: ? Lie in bed with your feet raised (elevated). ? Place a cold pack on your lower abdomen. ? Rest as much as possible until the bleeding stops or slows down.  Do not try to lose weight until the bleeding has stopped and your blood iron level is back to normal. Other Instructions  For two months, write down: ? When your period starts. ? When your period ends. ? When any abnormal bleeding occurs. ? What problems you notice.  Keep all follow up visits as told by your health care provider. This is important. Contact a health care provider if:  You get light-headed or weak.  You have nausea and vomiting.  You cannot eat or drink without vomiting.  You feel dizzy or have diarrhea while you are taking medicines.  You are taking birth control pills or hormones, and you want to change them or stop taking them. Get help right away if:  You develop a fever or chills.  You need to change your sanitary pad or tampon more than one time per hour.  Your bleeding becomes heavier, or your flow contains clots more often.  You develop pain in your abdomen.  You lose consciousness.  You develop a rash.  This information is not intended to replace advice given to you by your health care provider. Make sure you discuss any questions you have with your health care provider. Document Released: 01/24/2000 Document Revised: 07/04/2015 Document Reviewed: 04/23/2014 Elsevier Interactive Patient Education  [employer].

## 2017-02-04 NOTE — ED Provider Notes (Signed)
[facility] EMERGENCY DEPARTMENT Provider Note   CSN: [id number] Arrival date & time: 02/04/17  1018     History   Chief Complaint Chief Complaint  Patient presents with  . Vaginal Bleeding    HPI Crystal Gallagher is a 36 y.o. female.  HPI patient developed vaginal bleeding 5 days ago.  She states she passed 3 large clots yesterday.  Had slight amount of vaginal bleeding today.  She presently denies shortness of breath.  She does complain of low back pain and bilateral thigh pain which is chronic over several months to years.  She denies any abdominal pain.  She does admit to mild lightheadedness, worse with standing for the past 3 days.  Patient started on Xarelto recently for pulmonary embolism.  No treatment prior to coming here.  No other associated symptoms  Past Medical History:  Diagnosis Date  . Asthma   . Pneumonia     Patient Active Problem List   Diagnosis Date Noted  . Cough syncope 01/22/2017  . Mediastinal lymphadenopathy   . Pulmonary embolism on right ([place]) 01/17/2017  . Chronic cough 01/12/2017  . HCAP (healthcare-associated pneumonia) 12/05/2016  . Acute respiratory failure with hypoxia ([place]) 12/05/2016  . Asthma 12/05/2016  . Morbid obesity with BMI of 45.0-49.9, adult ([place]) 12/05/2016  . Pleuritic chest pain 12/05/2016    Past Surgical History:  Procedure Laterality Date  . APPENDECTOMY    . BREAST SURGERY  breast reduction    OB History    No data available       Home Medications    Prior to Admission medications   Medication Sig Start Date End Date Taking? Authorizing Provider  albuterol (PROVENTIL HFA;VENTOLIN HFA) 108 (90 Base) MCG/ACT inhaler Inhale 1-2 puffs into the lungs every 6 (six) hours as needed for wheezing. 10/30/16  Yes Tanna Furry, MD  albuterol (PROVENTIL) (5 MG/ML) 0.5% nebulizer solution Take 0.5 mLs (2.5 mg total) by nebulization every 6 (six) hours as needed for wheezing or shortness of breath. 11/10/16   Yes Hedges, Dellis Filbert, PA-C  Rivaroxaban (XARELTO STARTER PACK) 15 & 20 MG TBPK Start with one 15mg  tablet by mouth twice daily with food. On Day 22, switch to one 20mg  tablet daily with food. 01/28/17  Yes Lucious Groves, DO  levonorgestrel (MIRENA) 20 MCG/24HR IUD 1 each by Intrauterine route once.    [provider]    Family History Family History  Problem Relation Age of Onset  . Cancer Other   . Hypertension Other   . Arthritis Mother   . Colon cancer Father   . Breast cancer Maternal Aunt   . Colon cancer Paternal Uncle   . Stroke Maternal Grandmother   . Cancer Maternal Grandfather   . Hypertension Maternal Grandfather   . Alzheimer's disease Paternal Grandmother   . Breast cancer Maternal Aunt     Social History Social History   Tobacco Use  . Smoking status: Never Smoker  . Smokeless tobacco: Never Used  Substance Use Topics  . Alcohol use: No    Comment: occasionally  . Drug use: No     Allergies   Patient has no known allergies.   Review of Systems Review of Systems  Constitutional: Negative.   HENT: Negative.   Respiratory: Positive for cough. Negative for shortness of breath.        Chronic cough  Cardiovascular: Negative.   Gastrointestinal: Negative.   Genitourinary: Positive for vaginal bleeding.  Amenorrheic for 1 year.  Has IUD  Musculoskeletal: Positive for back pain and myalgias.       Bilateral leg pain, chronic.  Chronic back pain  Skin: Negative.   Neurological: Negative.   Hematological: Bruises/bleeds easily.  Psychiatric/Behavioral: Negative.   All other systems reviewed and are negative.    Physical Exam Updated Vital Signs BP (!) 120/59   Pulse 98   Temp 100 F (37.8 C) (Oral)   Resp (!) 24   LMP 02/01/2017 (Approximate)   SpO2 99%   Physical Exam  Constitutional: She appears well-developed and well-nourished.  HENT:  Head: Normocephalic and atraumatic.  Eyes: Conjunctivae are normal. Pupils are equal,  round, and reactive to light.  Neck: Neck supple. No tracheal deviation present. No thyromegaly present.  Cardiovascular: Regular rhythm.  No murmur heard. Mildly tachycardic  Pulmonary/Chest: Effort normal and breath sounds normal.  Abdominal: Soft. Bowel sounds are normal. She exhibits no distension. There is no tenderness.  Morbidly obese  Genitourinary:  Genitourinary Comments: Pelvic exam slight amount of dark blood in vaginal vault.  Cervical loss closed.  No cervical motion tenderness no adnexal masses or tenderness.  Musculoskeletal: Normal range of motion. She exhibits no edema or tenderness.  Neurological: She is alert. Coordination normal.  Skin: Skin is warm and dry. No rash noted.  Psychiatric: She has a normal mood and affect.  Nursing note and vitals reviewed.    ED Treatments / Results  Labs (all labs ordered are listed, but only abnormal results are displayed) Labs Reviewed  COMPREHENSIVE METABOLIC PANEL - Abnormal; Notable for the following components:      Result Value   CO2 20 (*)    Glucose, Bld 104 (*)    Alkaline Phosphatase 302 (*)    All other components within normal limits  CBC - Abnormal; Notable for the following components:   WBC 12.3 (*)    All other components within normal limits  URINALYSIS, ROUTINE W REFLEX MICROSCOPIC - Abnormal; Notable for the following components:   Color, Urine RED (*)    APPearance CLOUDY (*)    Glucose, UA   (*)    Value: TEST NOT REPORTED DUE TO COLOR INTERFERENCE OF URINE PIGMENT   Hgb urine dipstick   (*)    Value: TEST NOT REPORTED DUE TO COLOR INTERFERENCE OF URINE PIGMENT   Bilirubin Urine   (*)    Value: TEST NOT REPORTED DUE TO COLOR INTERFERENCE OF URINE PIGMENT   Ketones, ur   (*)    Value: TEST NOT REPORTED DUE TO COLOR INTERFERENCE OF URINE PIGMENT   Protein, ur   (*)    Value: TEST NOT REPORTED DUE TO COLOR INTERFERENCE OF URINE PIGMENT   Nitrite   (*)    Value: TEST NOT REPORTED DUE TO COLOR  INTERFERENCE OF URINE PIGMENT   Leukocytes, UA   (*)    Value: TEST NOT REPORTED DUE TO COLOR INTERFERENCE OF URINE PIGMENT   All other components within normal limits  URINALYSIS, MICROSCOPIC (REFLEX) - Abnormal; Notable for the following components:   Bacteria, UA RARE (*)    Squamous Epithelial / LPF 0-5 (*)    All other components within normal limits  LIPASE, BLOOD  I-STAT BETA HCG BLOOD, ED ([facility], [facility], [facility] ONLY)    EKG  EKG Interpretation None       Radiology No results found.  Procedures Procedures (including critical care time)  Medications Ordered in ED Medications  sodium chloride 0.9 % bolus  2,000 mL (2,000 mLs Intravenous New Bag/Given 02/04/17 1249)    Results for orders placed or performed during the hospital encounter of 02/04/17  Lipase, blood  Result Value Ref Range   Lipase 16 11 - 51 U/L  Comprehensive metabolic panel  Result Value Ref Range   Sodium 136 135 - 145 mmol/L   Potassium 3.5 3.5 - 5.1 mmol/L   Chloride 104 101 - 111 mmol/L   CO2 20 (L) 22 - 32 mmol/L   Glucose, Bld 104 (H) 65 - 99 mg/dL   BUN 9 6 - 20 mg/dL   Creatinine, Ser 0.62 0.44 - 1.00 mg/dL   Calcium 9.3 8.9 - 10.3 mg/dL   Total Protein 7.1 6.5 - 8.1 g/dL   Albumin 3.7 3.5 - 5.0 g/dL   AST 27 15 - 41 U/L   ALT 32 14 - 54 U/L   Alkaline Phosphatase 302 (H) 38 - 126 U/L   Total Bilirubin 0.6 0.3 - 1.2 mg/dL   GFR calc non Af Amer >60 >60 mL/min   GFR calc Af Amer >60 >60 mL/min   Anion gap 12 5 - 15  CBC  Result Value Ref Range   WBC 12.3 (H) 4.0 - 10.5 K/uL   RBC 4.69 3.87 - 5.11 MIL/uL   Hemoglobin 13.4 12.0 - 15.0 g/dL   HCT 40.6 36.0 - 46.0 %   MCV 86.6 78.0 - 100.0 fL   MCH 28.6 26.0 - 34.0 pg   MCHC 33.0 30.0 - 36.0 g/dL   RDW 14.1 11.5 - 15.5 %   Platelets 379 150 - 400 K/uL  Urinalysis, Routine w reflex microscopic  Result Value Ref Range   Color, Urine RED (A) YELLOW   APPearance CLOUDY (A) CLEAR   Specific Gravity, Urine  1.005 - 1.030    TEST NOT REPORTED  DUE TO COLOR INTERFERENCE OF URINE PIGMENT   pH  5.0 - 8.0    TEST NOT REPORTED DUE TO COLOR INTERFERENCE OF URINE PIGMENT   Glucose, UA (A) NEGATIVE mg/dL    TEST NOT REPORTED DUE TO COLOR INTERFERENCE OF URINE PIGMENT   Hgb urine dipstick (A) NEGATIVE    TEST NOT REPORTED DUE TO COLOR INTERFERENCE OF URINE PIGMENT   Bilirubin Urine (A) NEGATIVE    TEST NOT REPORTED DUE TO COLOR INTERFERENCE OF URINE PIGMENT   Ketones, ur (A) NEGATIVE mg/dL    TEST NOT REPORTED DUE TO COLOR INTERFERENCE OF URINE PIGMENT   Protein, ur (A) NEGATIVE mg/dL    TEST NOT REPORTED DUE TO COLOR INTERFERENCE OF URINE PIGMENT   Nitrite (A) NEGATIVE    TEST NOT REPORTED DUE TO COLOR INTERFERENCE OF URINE PIGMENT   Leukocytes, UA (A) NEGATIVE    TEST NOT REPORTED DUE TO COLOR INTERFERENCE OF URINE PIGMENT  Urinalysis, Microscopic (reflex)  Result Value Ref Range   RBC / HPF TOO NUMEROUS TO COUNT 0 - 5 RBC/hpf   WBC, UA 0-5 0 - 5 WBC/hpf   Bacteria, UA RARE (A) NONE SEEN   Squamous Epithelial / LPF 0-5 (A) NONE SEEN   Urine-Other LESS THAN 10 mL OF URINE SUBMITTED   I-Stat beta hCG blood, ED  Result Value Ref Range   I-stat hCG, quantitative <5.0 <5 mIU/mL   Comment 3           Ct Angio Chest Pe W/cm &/or Wo Cm  Result Date: 01/17/2017 CLINICAL DATA:  Shortness of breath and chest pain for the last several months. EXAM: CT  ANGIOGRAPHY CHEST WITH CONTRAST TECHNIQUE: Multidetector CT imaging of the chest was performed using the standard protocol during bolus administration of intravenous contrast. Multiplanar CT image reconstructions and MIPs were obtained to evaluate the vascular anatomy. CONTRAST:  180mL ISOVUE-370 IOPAMIDOL (ISOVUE-370) INJECTION 76% COMPARISON:  Radiography same day.  CT 12/05/2016 FINDINGS: Cardiovascular: Pulmonary arterial opacification is good. 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 No visible emboli on the left. No aortic disease. No coronary  artery calcification. Maximum right ventricular diameter is 4.3 cm. Maximal left ventricular diameter is 2.5 cm. This suggests right ventricular strain. Additionally, the patient appears to have left ventricular muscular hypertrophy. Mediastinum/Nodes: Enlarged subcarinal and bilateral hilar lymph nodes. Lungs/Pleura: Right lung is clear. Left lung shows complete consolidation of the lower lobe with collapse. Areas of patchy pneumonia present throughout the left upper lobe. Upper Abdomen: Negative Musculoskeletal: Negative Review of the MIP images confirms the above findings. 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 Evidence of right ventricular strain. Positive for acute PE with CT evidence of right heart strain (RV/LV Ratio = 1.7) consistent with at least submassive (intermediate risk) PE. The presence of right heart strain has been associated with an increased risk of morbidity and mortality. Please activate Code PE by paging [phone]. Worsened consolidation and collapse throughout the left lower lobe. Patchy pneumonia present throughout the left upper lobe. Bilateral hilar and subcarinal lymphadenopathy, presumably reactive. Cannot rule out the possibility of underlying process such as sarcoid. Evidence of left ventricular hypertrophy. Electronically Signed   By: Nelson Chimes M.D.   On: 01/17/2017 09:24   Dg Chest Port 1 View  Result Date: 01/17/2017 CLINICAL DATA:  36 year old female with shortness of breath. EXAM: PORTABLE CHEST 1 VIEW COMPARISON:  01/04/2017 FINDINGS: Cardiomediastinal silhouette is partially obscured but grossly unchanged. There is worsening opacity at the left lower lobe consistent with airspace consolidation. The right lung is clear. Small left-sided effusion not excluded. No definite pneumothorax. No acute osseous abnormalities. IMPRESSION: Worsening left lower lobe consolidation. Continued follow-up recommended. Electronically Signed   By:  Kristopher Oppenheim M.D.   On: 01/17/2017 06:59   Initial Impression / Assessment and Plan / ED Course  I have reviewed the triage vital signs and the nursing notes.  Pertinent labs & imaging results that were available during my care of the patient were reviewed by me and considered in my medical decision making (see chart for details).     2:30 PM patient not lightheaded on standing.  After treatment with intravenous fluids feels ready for discharge.  Tylenol given for aches. Patient's hemoglobin has not significantly dropped after vaginal bleeding for 5 days  Plan follow-up with gynecologist . Hematuria  felt to be contaminant Final Clinical Impressions(s) / ED Diagnoses  Diagnosis abnormal vaginal bleeding Final diagnoses:  None    ED Discharge Orders    None       Orlie Dakin, MD 02/04/17 1547

## 2017-02-04 NOTE — Discharge Instructions (Signed)
Call your gynecologist to schedule the next available appointment regarding abnormal vaginal bleeding

## 2017-02-04 NOTE — Progress Notes (Signed)
Chief Complaint  Patient presents with  . Cough    Pt states she stills feels really weak. Pt states she has been getting dizzy and lightheaded very easily when coughing.  . Follow-up  . Vaginal Bleeding    Pt states she fell on Saturday and has been experiencing vaginal bleeding. Pt states she passed 3 clots on Sunday and been passing clots once a day. Pt states she has been bleeding since Sunday. Pt has Mirena and hasn't had a period in over a year.    HPI Anticoagulation She reports that she has been changed from her anticoagulation with coumadin to xarelto due to low INR Lab Results  Component Value Date   INR 2.2 01/28/2017   INR 1.90 01/25/2017   INR 1.93 01/23/2017    Pt was transitioned to Xarelto and shortly after developed vaginal bleeding and weakness. She reports that she started Xarelto on Thursday 01/28/2017 She reports that she started having vaginal bleeding She reports that she has an IUD and has not had a period for over a year She reports that she has been having 3-4 BM a day.  She reports that she cannot tell if there is blood in her stool because of the heavy vaginal bleeding. She is still having heavy vaginal bleeding and passing clots the size of golf balls. She reports that on Sunday she had 3 large clots. She reports that she went to the ER but could not sit there due to her pain.  Syncopal Episodes  She stats that she had a coughing spell and fell on Saturday when she blacked out from her coughing spells.  She states that her mother heard the cough and checked on her and came in to see her She reports that she has been getting a lot more episodes of blacking out after coughing. She reports that in the past week she had one episode on Saturday and yesterday she blacked out 3 times after coughing. She reports that she cannot see or hear and it is usually after an episode of forceful coughing.   She reports that due to her diarrhea she cannot eat She states  that she can only eat a small bowl of food to take her medications Wt Readings from Last 3 Encounters:  02/04/17 (!) 321 lb 3.2 oz (145.7 kg)  02/01/17 (!) 325 lb (147.4 kg)  01/28/17 (!) 329 lb (149.2 kg)    Past Medical History:  Diagnosis Date  . Asthma   . Pneumonia     Current Outpatient Medications  Medication Sig Dispense Refill  . ibuprofen (ADVIL,MOTRIN) 200 MG tablet Take 400 mg by mouth every 6 (six) hours as needed for mild pain or cramping.    Marland Kitchen levonorgestrel (MIRENA) 20 MCG/24HR IUD 1 each by Intrauterine route once.    . Rivaroxaban (XARELTO STARTER PACK) 15 & 20 MG TBPK Start with one 15mg  tablet by mouth twice daily with food. On Day 22, switch to one 20mg  tablet daily with food. 51 each 0  . albuterol (PROVENTIL HFA;VENTOLIN HFA) 108 (90 Base) MCG/ACT inhaler Inhale 1-2 puffs into the lungs every 6 (six) hours as needed for wheezing. (Patient not taking: Reported on 02/01/2017) 1 Inhaler 0  . albuterol (PROVENTIL) (5 MG/ML) 0.5% nebulizer solution Take 0.5 mLs (2.5 mg total) by nebulization every 6 (six) hours as needed for wheezing or shortness of breath. (Patient not taking: Reported on 02/01/2017) 20 mL 12  . Lidocaine 4 % PTCH Apply 1 patch topically every  12 (twelve) hours as needed. (Patient not taking: Reported on 02/01/2017) 30 patch 1  . omeprazole (PRILOSEC) 20 MG capsule Take 1 capsule (20 mg total) by mouth daily. (Patient not taking: Reported on 02/01/2017) 30 capsule 3   No current facility-administered medications for this visit.     Allergies: No Known Allergies  Past Surgical History:  Procedure Laterality Date  . APPENDECTOMY    . BREAST SURGERY  breast reduction    Social History   Socioeconomic History  . Marital status: Single    Spouse name: None  . Number of children: None  . Years of education: None  . Highest education level: None  Social Needs  . Financial resource strain: None  . Food insecurity - worry: None  . Food  insecurity - inability: None  . Transportation needs - medical: None  . Transportation needs - non-medical: None  Occupational History  . None  Tobacco Use  . Smoking status: Never Smoker  . Smokeless tobacco: Never Used  Substance and Sexual Activity  . Alcohol use: No    Comment: occasionally  . Drug use: No  . Sexual activity: None  Other Topics Concern  . None  Social History Narrative  . None    Family History  Problem Relation Age of Onset  . Cancer Other   . Hypertension Other   . Arthritis Mother   . Colon cancer Father   . Breast cancer Maternal Aunt   . Colon cancer Paternal Uncle   . Stroke Maternal Grandmother   . Cancer Maternal Grandfather   . Hypertension Maternal Grandfather   . Alzheimer's disease Paternal Grandmother   . Breast cancer Maternal Aunt      Review of Systems  Constitutional: Positive for diaphoresis, malaise/fatigue and weight loss. Negative for chills and fever.  HENT: Negative for hearing loss and tinnitus.   Eyes: Negative for blurred vision and double vision.  Respiratory: Positive for cough, shortness of breath and wheezing. Negative for sputum production.   Cardiovascular: Positive for palpitations. Negative for chest pain, orthopnea, claudication, leg swelling and PND.  Gastrointestinal: Positive for diarrhea, nausea and vomiting. Negative for abdominal pain, blood in stool, constipation and melena.  Genitourinary: Negative for dysuria, frequency and urgency.  Musculoskeletal: Negative for back pain, myalgias and neck pain.  Skin: Negative for itching and rash.  Neurological: Positive for dizziness, loss of consciousness and weakness. Negative for tingling, tremors, sensory change, speech change and headaches.  Psychiatric/Behavioral: Negative for depression. The patient is not nervous/anxious.       Objective: Vitals:   02/04/17 0850 02/04/17 0858  BP: 132/90   Pulse: (!) 132 (!) 118  Resp: 18   Temp: 99.2 F (37.3 C)     TempSrc: Oral   SpO2: 97%   Weight: (!) 321 lb 3.2 oz (145.7 kg)   Height: 5\' 6"  (1.676 m)   PF: 220 L/min     Physical Exam  Constitutional: She is oriented to person, place, and time. She appears well-developed and well-nourished.  HENT:  Head: Normocephalic and atraumatic.  Mouth/Throat: Oropharynx is clear and moist.  Eyes: Conjunctivae and EOM are normal.  Cardiovascular: Tachycardia present.  No murmur heard. Pulmonary/Chest: Breath sounds normal. No stridor. No respiratory distress.  Abdominal: Soft. Bowel sounds are normal. She exhibits no distension. There is no tenderness. There is no guarding.  Neurological: She is alert and oriented to person, place, and time.  Skin: Skin is warm. Capillary refill takes less than 2 seconds.  Psychiatric: She has a normal mood and affect. Her behavior is normal. Judgment and thought content normal.     ECG with sinus tachycardia, no st elevation  Assessment and Plan Crystal Gallagher was seen today for cough, follow-up and vaginal bleeding.  Diagnoses and all orders for this visit:  Episode of heavy vaginal bleeding -     EKG 12-Lead -     POCT CBC -     Protime-INR -     Comprehensive metabolic panel  Anticoagulated -     EKG 12-Lead -     POCT CBC -     Protime-INR -     Comprehensive metabolic panel  Weakness -     EKG 12-Lead -     POCT CBC -     Protime-INR -     Comprehensive metabolic panel  Cough syncope  Pulmonary embolism on right [facility])  Mediastinal lymphadenopathy   Medical Decision Making This is a 3RT female with uncertain cause for her right PE. Her coagulability work up was negative. She is scheduled for bronchoscopy with Pulmonology for her mediastinal lymphadenopathy.  Wondering if sarcoid might be a cause. Will await Pulmonology recs.  She also synopsizes with coughing which is a risk for hemorrhage.  There was a plan for outpatient work up but while setting up for her pelvic exam the patient cough and  blood sprayed from the vagina. With that levels of brisk bleeding there is concern for her safety as an outpatient.  Her hemoglobin is 13.3 improved from 12.1 but there might be hemoconcentration due to dehydration. While prepping the patient for her pelvic exam and ECG She has weakness and lightheadedness as well as poor PO intake.  Given these findings patient as advised to go to the ER for evaluation.  Report was called and ER attending agreed with the plan.  Patient transferred by private vehicle. Her mother is the driver.   A total of 42 minutes were spent face-to-face with the patient during this encounter and over half of that time was spent on counseling and coordination of care.   [place]

## 2017-02-04 NOTE — ED Triage Notes (Signed)
Pt states she was recently dx with a blood clot and started on xarelto. She states she has had persistent vaginal bleeding, passing large clots. She also states she has had abdominal pain. Pt also reports coughing and some shortness of breath. Tachy in triage, appears pale.

## 2017-02-04 NOTE — ED Notes (Signed)
Pt ambulated to restroom located beside room, tolerated well.

## 2017-02-04 NOTE — ED Notes (Signed)
Dr. Winfred Leeds aware of Kaitlyns note and pts bleeding.

## 2017-02-04 NOTE — Telephone Encounter (Signed)
Copied from [place] [phone]. Topic: Quick Communication - See Telephone Encounter >> Feb 04, 2017  1:39 PM Boyd Kerbs wrote: CRM for notification. See Telephone encounter for:  Butch Penny, mom called is calling to see if Dr. Nolon Rod can do something to help patient. At hospital and she did blood work and is giving her fluids. But she is in a lot of pain and coughing a lot and can't get any help from ED.  02/04/17.

## 2017-02-05 LAB — COMPREHENSIVE METABOLIC PANEL
ALK PHOS: 316 IU/L — AB (ref 39–117)
ALT: 30 IU/L (ref 0–32)
AST: 21 IU/L (ref 0–40)
Albumin/Globulin Ratio: 1.7 (ref 1.2–2.2)
Albumin: 4.3 g/dL (ref 3.5–5.5)
BUN/Creatinine Ratio: 19 (ref 9–23)
BUN: 10 mg/dL (ref 6–20)
Bilirubin Total: 0.2 mg/dL (ref 0.0–1.2)
CALCIUM: 9.7 mg/dL (ref 8.7–10.2)
CO2: 19 mmol/L — AB (ref 20–29)
CREATININE: 0.53 mg/dL — AB (ref 0.57–1.00)
Chloride: 104 mmol/L (ref 96–106)
GFR calc Af Amer: 139 mL/min/{1.73_m2} (ref >59)
GFR, EST NON AFRICAN AMERICAN: 121 mL/min/{1.73_m2} (ref >59)
Globulin, Total: 2.5 g/dL (ref 1.5–4.5)
Glucose: 103 mg/dL — ABNORMAL HIGH (ref 65–99)
Potassium: 3.7 mmol/L (ref 3.5–5.2)
Sodium: 138 mmol/L (ref 134–144)
Total Protein: 6.8 g/dL (ref 6.0–8.5)

## 2017-02-05 NOTE — Telephone Encounter (Signed)
Provider notified of pt being seen in the ER. Provider will follow-up with pt.

## 2017-02-06 ENCOUNTER — Inpatient Hospital Stay (HOSPITAL_COMMUNITY): Payer: 59

## 2017-02-06 ENCOUNTER — Encounter (HOSPITAL_COMMUNITY): Payer: Self-pay | Admitting: *Deleted

## 2017-02-06 ENCOUNTER — Inpatient Hospital Stay (HOSPITAL_COMMUNITY)
Admission: AD | Admit: 2017-02-06 | Discharge: 2017-02-06 | Disposition: A | Discharge status: Home / Self Care | Payer: 59 | Source: Ambulatory Visit | Attending: Obstetrics and Gynecology | Admitting: Obstetrics and Gynecology

## 2017-02-06 DIAGNOSIS — D252 Subserosal leiomyoma of uterus: Secondary | ICD-10-CM | POA: Insufficient documentation

## 2017-02-06 DIAGNOSIS — M5431 Sciatica, right side: Secondary | ICD-10-CM | POA: Diagnosis not present

## 2017-02-06 DIAGNOSIS — Z79899 Other long term (current) drug therapy: Secondary | ICD-10-CM | POA: Insufficient documentation

## 2017-02-06 DIAGNOSIS — R109 Unspecified abdominal pain: Secondary | ICD-10-CM | POA: Diagnosis not present

## 2017-02-06 DIAGNOSIS — Z975 Presence of (intrauterine) contraceptive device: Secondary | ICD-10-CM | POA: Diagnosis not present

## 2017-02-06 DIAGNOSIS — T8389XS Other specified complication of genitourinary prosthetic devices, implants and grafts, sequela: Secondary | ICD-10-CM

## 2017-02-06 DIAGNOSIS — Z86711 Personal history of pulmonary embolism: Secondary | ICD-10-CM | POA: Diagnosis present

## 2017-02-06 DIAGNOSIS — N939 Abnormal uterine and vaginal bleeding, unspecified: Secondary | ICD-10-CM | POA: Diagnosis present

## 2017-02-06 DIAGNOSIS — D259 Leiomyoma of uterus, unspecified: Secondary | ICD-10-CM

## 2017-02-06 DIAGNOSIS — R102 Pelvic and perineal pain: Secondary | ICD-10-CM | POA: Diagnosis not present

## 2017-02-06 DIAGNOSIS — J9819 Other pulmonary collapse: Secondary | ICD-10-CM | POA: Insufficient documentation

## 2017-02-06 DIAGNOSIS — J984 Other disorders of lung: Secondary | ICD-10-CM | POA: Diagnosis not present

## 2017-02-06 DIAGNOSIS — J45909 Unspecified asthma, uncomplicated: Secondary | ICD-10-CM | POA: Diagnosis not present

## 2017-02-06 DIAGNOSIS — Z8701 Personal history of pneumonia (recurrent): Secondary | ICD-10-CM | POA: Diagnosis not present

## 2017-02-06 DIAGNOSIS — T839XXA Unspecified complication of genitourinary prosthetic device, implant and graft, initial encounter: Secondary | ICD-10-CM

## 2017-02-06 DIAGNOSIS — T8332XS Displacement of intrauterine contraceptive device, sequela: Secondary | ICD-10-CM

## 2017-02-06 HISTORY — DX: Other pulmonary embolism without acute cor pulmonale: I26.99

## 2017-02-06 HISTORY — DX: Other disorders of lung: J98.4

## 2017-02-06 LAB — CBC
HCT: 34.1 % — ABNORMAL LOW (ref 36.0–46.0)
Hemoglobin: 11.1 g/dL — ABNORMAL LOW (ref 12.0–15.0)
MCH: 28.2 pg (ref 26.0–34.0)
MCHC: 32.6 g/dL (ref 30.0–36.0)
MCV: 86.8 fL (ref 78.0–100.0)
PLATELETS: 301 10*3/uL (ref 150–400)
RBC: 3.93 MIL/uL (ref 3.87–5.11)
RDW: 14.4 % (ref 11.5–15.5)
WBC: 10 10*3/uL (ref 4.0–10.5)

## 2017-02-06 LAB — WET PREP, GENITAL
CLUE CELLS WET PREP: NONE SEEN
Sperm: NONE SEEN
Trich, Wet Prep: NONE SEEN
Yeast Wet Prep HPF POC: NONE SEEN

## 2017-02-06 MED ORDER — FERROUS SULFATE 325 (65 FE) MG PO TABS: 325.0000 mg | ORAL_TABLET | Freq: Every day | ORAL | 1 refills | Status: AC

## 2017-02-06 MED ORDER — MEDROXYPROGESTERONE ACETATE 10 MG PO TABS
10.0000 mg | ORAL_TABLET | Freq: Once | ORAL | Status: AC
  Administered 2017-02-06: 10 mg via ORAL
  Filled 2017-02-06: qty 1

## 2017-02-06 MED ORDER — MEDROXYPROGESTERONE ACETATE 10 MG PO TABS: 10.0000 mg | ORAL_TABLET | Freq: Every day | ORAL | 0 refills | Status: DC

## 2017-02-06 NOTE — MAU Provider Note (Signed)
Chief Complaint: Vaginal Bleeding   First Provider Initiated Contact with Patient 02/06/17 1124      SUBJECTIVE HPI: Crystal Gallagher is a 36 y.o. G1P0010 with medical hx significant for recent PE with RV strain and collapsed lung on 01/17/17 who presents to maternity admissions reporting heavy vaginal bleeding and pain in her right lower abdomen and right groin area.  She has an IUD placed over 1 year ago and did not have any vaginal bleeding x 1 year until recently. While she was admitted to the hospital for her PE, she started having light vaginal bleeding. The bleeding continued intermittently after discharge from the hospital, then became heavy with large clots 1 week ago, approximately 12/23.  She was seen in [facility] on 12/27 and had labwork and was discharged to follow up with OB/Gyn.  She returns today reporting her bleeding has not changed in intensity since this increase 1 week ago.  Her pain in her RLQ and groin started >1 month ago but was mild and intermittent. Also 1 week ago, the pain in her groin area increased and is radiating down her right leg, making it difficult to walk. She is using a cane in MAU today.  She has been diagnosed with sciatica but thinks this pain is worse than her previous nerve pain.  She has tried Tylenol, heat, and rest, which are not helping. She has not tried any treatments for her bleeding. She has no other associated symptoms, but does have symptoms related to her lung disease, including cough. She denies shortness of breath at rest, and denies any chest pain today.  She denies dizziness, fatigue.   HPI  Past Medical History:  Diagnosis Date  . Asthma   . 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 Pneumonia   . Pulmonary embolism [facility])    Past Surgical History:  Procedure Laterality Date  . APPENDECTOMY    . BREAST SURGERY  breast reduction   Social History   Socioeconomic History  . Marital status: Single    Spouse name: Not on file  . Number of  children: Not on file  . Years of education: Not on file  . Highest education level: Not on file  Social Needs  . Financial resource strain: Not on file  . Food insecurity - worry: Not on file  . Food insecurity - inability: Not on file  . Transportation needs - medical: Not on file  . Transportation needs - non-medical: Not on file  Occupational History  . Not on file  Tobacco Use  . Smoking status: Never Smoker  . Smokeless tobacco: Never Used  Substance and Sexual Activity  . Alcohol use: No    Comment: occasionally  . Drug use: No  . Sexual activity: Yes    Birth control/protection: IUD  Other Topics Concern  . Not on file  Social History Narrative  . Not on file   No current facility-administered medications on file prior to encounter.    Current Outpatient Medications on File Prior to Encounter  Medication Sig Dispense Refill  . acetaminophen (TYLENOL) 500 MG tablet Take 1,000 mg by mouth every 6 (six) hours as needed for moderate pain.    Marland Kitchen albuterol (PROVENTIL HFA;VENTOLIN HFA) 108 (90 Base) MCG/ACT inhaler Inhale 1-2 puffs into the lungs every 6 (six) hours as needed for wheezing. 1 Inhaler 0  . albuterol (PROVENTIL) (5 MG/ML) 0.5% nebulizer solution Take 0.5 mLs (2.5 mg total) by nebulization every 6 (six) hours  as needed for wheezing or shortness of breath. 20 mL 12  . guaifenesin (ROBITUSSIN) 100 MG/5ML syrup Take 12.5 mLs by mouth daily as needed for cough.    Marland Kitchen omeprazole (PRILOSEC) 20 MG capsule Take 20 mg by mouth 2 (two) times daily.    . Rivaroxaban (XARELTO STARTER PACK) 15 & 20 MG TBPK Start with one 15mg  tablet by mouth twice daily with food. On Day 22, switch to one 20mg  tablet daily with food. 51 each 0   No Known Allergies  ROS:  Review of Systems  Constitutional: Negative for chills, fatigue and fever.  Respiratory: Positive for cough. Negative for shortness of breath.   Cardiovascular: Negative for chest pain.  Gastrointestinal: Positive for  abdominal pain. Negative for nausea and vomiting.  Genitourinary: Positive for pelvic pain and vaginal bleeding. Negative for difficulty urinating, dysuria, flank pain, vaginal discharge and vaginal pain.  Musculoskeletal: Positive for myalgias.  Neurological: Negative for dizziness and headaches.  Psychiatric/Behavioral: Negative.      I have reviewed patient's Past Medical Hx, Surgical Hx, Family Hx, Social Hx, medications and allergies.   Physical Exam   Patient Vitals for the past 24 hrs:  BP Temp Temp src Pulse Resp SpO2  02/06/17 1441 121/73 98.8 F (37.1 C) Oral (!) 115 20 94 %  02/06/17 0951 127/76 99.1 F (37.3 C) Oral (!) 116 (!) 22 96 %   Constitutional: Well-developed, well-nourished female in no acute distress.  Cardiovascular: normal rate Respiratory: normal effort GI: Abd soft, non-tender. No rebound tenderness or guarding.  Pos BS x 4 MS: Extremities nontender, no edema, normal ROM Neurologic: Alert and oriented x 4.  GU: Neg CVAT.  PELVIC EXAM: Cervix pink, visually closed, without lesion, small amount dark red bleeding with small clots, vaginal walls and external genitalia normal Bimanual exam: Cervix 0/long/high, firm, anterior, neg CMT, uterus nontender, nonenlarged, adnexa without tenderness, enlargement, or mass   LAB RESULTS Results for orders placed or performed during the hospital encounter of 02/06/17 (from the past 24 hour(s))  CBC     Status: Abnormal   Collection Time: 02/06/17 10:59 AM  Result Value Ref Range   WBC 10.0 4.0 - 10.5 K/uL   RBC 3.93 3.87 - 5.11 MIL/uL   Hemoglobin 11.1 (L) 12.0 - 15.0 g/dL   HCT 34.1 (L) 36.0 - 46.0 %   MCV 86.8 78.0 - 100.0 fL   MCH 28.2 26.0 - 34.0 pg   MCHC 32.6 30.0 - 36.0 g/dL   RDW 14.4 11.5 - 15.5 %   Platelets 301 150 - 400 K/uL  Wet prep, genital     Status: Abnormal   Collection Time: 02/06/17 11:20 AM  Result Value Ref Range   Yeast Wet Prep HPF POC NONE SEEN NONE SEEN   Trich, Wet Prep NONE SEEN  NONE SEEN   Clue Cells Wet Prep HPF POC NONE SEEN NONE SEEN   WBC, Wet Prep HPF POC FEW (A) NONE SEEN   Sperm NONE SEEN        IMAGING Dg Abd 1 View  Result Date: 02/06/2017 CLINICAL DATA:  Right groin pain. EXAM: ABDOMEN - 1 VIEW COMPARISON:  None. FINDINGS: The bowel gas pattern is normal. No unexpected radio-opaque calculi or other significant radiographic abnormality are seen. Phleboliths noted over the anatomic pelvis bilaterally. IMPRESSION: Negative. Electronically Signed   By: Misty Stanley M.D.   On: 02/06/2017 13:16   Ct Angio Chest Pe W/cm &/or Wo Cm  Result Date: 01/17/2017 CLINICAL DATA:  Shortness of breath and chest pain for the last several months. EXAM: CT ANGIOGRAPHY CHEST WITH CONTRAST TECHNIQUE: Multidetector CT imaging of the chest was performed using the standard protocol during bolus administration of intravenous contrast. Multiplanar CT image reconstructions and MIPs were obtained to evaluate the vascular anatomy. CONTRAST:  164mL ISOVUE-370 IOPAMIDOL (ISOVUE-370) INJECTION 76% COMPARISON:  Radiography same day.  CT 12/05/2016 FINDINGS: Cardiovascular: Pulmonary arterial opacification is good. 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 No visible emboli on the left. No aortic disease. No coronary artery calcification. Maximum right ventricular diameter is 4.3 cm. Maximal left ventricular diameter is 2.5 cm. This suggests right ventricular strain. Additionally, the patient appears to have left ventricular muscular hypertrophy. Mediastinum/Nodes: Enlarged subcarinal and bilateral hilar lymph nodes. Lungs/Pleura: Right lung is clear. Left lung shows complete consolidation of the lower lobe with collapse. Areas of patchy pneumonia present throughout the left upper lobe. Upper Abdomen: Negative Musculoskeletal: Negative Review of the MIP images confirms the above findings. 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 Evidence of right ventricular strain. Positive for acute PE with CT evidence of right heart strain (RV/LV Ratio = 1.7) consistent with at least submassive (intermediate risk) PE. The presence of right heart strain has been associated with an increased risk of morbidity and mortality. Please activate Code PE by paging [phone]. Worsened consolidation and collapse throughout the left lower lobe. Patchy pneumonia present throughout the left upper lobe. Bilateral hilar and subcarinal lymphadenopathy, presumably reactive. Cannot rule out the possibility of underlying process such as sarcoid. Evidence of left ventricular hypertrophy. Electronically Signed   By: Nelson Chimes M.D.   On: 01/17/2017 09:24   Dg Chest Port 1 View  Result Date: 01/17/2017 CLINICAL DATA:  36 year old female with shortness of breath. EXAM: PORTABLE CHEST 1 VIEW COMPARISON:  01/04/2017 FINDINGS: Cardiomediastinal silhouette is partially obscured but grossly unchanged. There is worsening opacity at the left lower lobe consistent with airspace consolidation. The right lung is clear. Small left-sided effusion not excluded. No definite pneumothorax. No acute osseous abnormalities. IMPRESSION: Worsening left lower lobe consolidation. Continued follow-up recommended. Electronically Signed   By: Kristopher Oppenheim M.D.   On: 01/17/2017 06:59   US Pelvic Complete With Transvaginal  Result Date: 02/06/2017 CLINICAL DATA:  Abnormal uterine bleeding.  IUD.  Fibroids. EXAM: TRANSABDOMINAL AND TRANSVAGINAL ULTRASOUND OF PELVIS TECHNIQUE: Both transabdominal and transvaginal ultrasound examinations of the pelvis were performed. Transabdominal technique was performed for global imaging of the pelvis including uterus, ovaries, adnexal regions, and pelvic cul-de-sac. It was necessary to proceed with endovaginal exam following the transabdominal exam to visualize the IUD and ovaries. COMPARISON:  None FINDINGS: Uterus Measurements: 10.5 x 6.3  x 9.9 cm. A large subserosal fibroid is seen in the left posterior corpus 7.1 cm in maximum diameter. A second fibroid is seen in the left anterior corpus measuring 2.1 cm. Endometrium Thickness: 24 mm. Heterogeneous appearance, but no focal abnormality visualized. No IUD visualized. Right ovary Measurements: 3.4 x 1.7 x 2.5 cm. Normal appearance/no adnexal mass. Left ovary Measurements: 3.3 x 1.8 x 1.8 cm. Normal appearance/no adnexal mass. Other findings No abnormal free fluid. IMPRESSION: Two uterine fibroids, largest measuring 7.1 cm. Thickened heterogeneous endometrium measuring 24 mm. No IUD visualized. Normal appearance of both ovaries.  No adnexal mass identified. Electronically Signed   By: Earle Gell M.D.   On: 02/06/2017 12:19    MAU Management/MDM: CBC, UA Pelvic US CBC with drop in  hgb from 13.4 on 12/27 to 11.1 today No evidence of acute abdomen, pain is in right inguinal area, below abdomen Pelvic US with thickened endometrium and no IUD present Consult Dr Ilda Basset with pt history, assessment and findings Given pt recent increase in abdominal pain, recommend abdominal X-ray to rule out perforation and migration of IUD.   X-ray wnl so D/C home on Provera per Dr Ilda Basset.  Since pt is therapeutic on Xeralto, thrombotic risk with progesterone is minimal. Bleeding is likely due to anticoagulation therapy with IUD no longer present and to uterine fibroids. Follow up in [facility] office as soon as available.   Consider replacing IUD as pt not good candidate for surgery while on anticoagulation. Treatments in MAU included Provera 10 mg PO x 1. Pt discharged with strict bleeding and PE/DVT precautions. Instructions for treatment of sciatica given including stretches and PRN use of ice Rx for Provera and ferrous sulfate.  Message sent to office to set up visit.  ASSESSMENT 1. Abnormal uterine bleeding (AUB)   2. IUD complication ([place])   3. Abdominal pain in female patient   4. Intrauterine  device (IUD) migration, sequela   5. Uterine leiomyoma, unspecified location   6. Sciatica of right side     PLAN Discharge home Allergies as of 02/06/2017   No Known Allergies     Medication List    STOP taking these medications   levonorgestrel 20 MCG/24HR IUD Commonly known as:  MIRENA     TAKE these medications   acetaminophen 500 MG tablet Commonly known as:  TYLENOL Take 1,000 mg by mouth every 6 (six) hours as needed for moderate pain.   albuterol 108 (90 Base) MCG/ACT inhaler Commonly known as:  PROVENTIL HFA;VENTOLIN HFA Inhale 1-2 puffs into the lungs every 6 (six) hours as needed for wheezing.   albuterol (5 MG/ML) 0.5% nebulizer solution Commonly known as:  PROVENTIL Take 0.5 mLs (2.5 mg total) by nebulization every 6 (six) hours as needed for wheezing or shortness of breath.   ferrous sulfate 325 (65 FE) MG tablet Commonly known as:  FERROUSUL Take 1 tablet (325 mg total) by mouth daily with breakfast.   guaifenesin 100 MG/5ML syrup Commonly known as:  ROBITUSSIN Take 12.5 mLs by mouth daily as needed for cough.   medroxyPROGESTERone 10 MG tablet Commonly known as:  PROVERA Take 1 tablet (10 mg total) by mouth daily. Use for ten days   omeprazole 20 MG capsule Commonly known as:  PRILOSEC Take 20 mg by mouth 2 (two) times daily.   Rivaroxaban 15 & 20 MG Tbpk Commonly known as:  [place] Start with one 15mg  tablet by mouth twice daily with food. On Day 22, switch to one 20mg  tablet daily with food.      Follow-up [facility] for [place] Follow up.   Specialty:  Obstetrics and Gynecology Why:  The office will call you with a follow up appointment. Return to MAU as needed for Gyn emergencies.  Contact information: [place] Certified Nurse-Midwife 02/06/2017  4:12 PM

## 2017-02-06 NOTE — MAU Note (Signed)
Patient has IUD and thinks it moved because she's been passing softball sized clots and having heavy bleeding for 1 week.  Pt had a "blood clot in my right lung artery" on 01/18/17 and is now on zorelto.  Having pain on right back shooting down leg that she thinks is sciatica, also having pain in right groin.

## 2017-02-08 LAB — GC/CHLAMYDIA PROBE AMP (~~LOC~~) NOT AT ARMC
CHLAMYDIA, DNA PROBE: NEGATIVE
NEISSERIA GONORRHEA: NEGATIVE

## 2017-02-09 ENCOUNTER — Inpatient Hospital Stay (HOSPITAL_COMMUNITY)
Admission: AD | Admit: 2017-02-09 | Discharge: 2017-02-09 | Disposition: A | Discharge status: Home / Self Care | Payer: 59 | Source: Ambulatory Visit | Attending: Family Medicine | Admitting: Family Medicine

## 2017-02-09 DIAGNOSIS — Z760 Encounter for issue of repeat prescription: Secondary | ICD-10-CM | POA: Diagnosis present

## 2017-02-09 DIAGNOSIS — N939 Abnormal uterine and vaginal bleeding, unspecified: Secondary | ICD-10-CM

## 2017-02-09 MED ORDER — MEDROXYPROGESTERONE ACETATE 10 MG PO TABS: 10.0000 mg | ORAL_TABLET | Freq: Every day | ORAL | 0 refills | Status: DC

## 2017-02-09 NOTE — MAU Note (Signed)
Pt here with continued bleeding. Was seen here on Saturday for bleeding, evaluated, US done, blood work, was given prescription to help stop the bleeding but wasn't able to fill it (said once a day for 10 days, but quantity for 7, so [facility] didn't fill it).

## 2017-02-09 NOTE — MAU Provider Note (Signed)
Ms. Crystal Gallagher is a 37 y.o. G1P0010 who presents to MAU today stating that she had evaluation earlier in the week and was given Rx for Provera that she was unable to fill. She is still bleeding. She states that pharmacy refused to fill Rx since it stated medication to be taken for 10 days, but only 7 quantity.   New Rx for Provera given today.   Warning signs for worsening condition discussed.  Patient will be seen at [facility]. Per CNM note from last visit, referral message was sent and they will call patient with an appointment.   Luvenia Redden, PA-C 02/09/2017 9:07 PM

## 2017-02-09 NOTE — Discharge Instructions (Signed)
Dysfunctional Uterine Bleeding Dysfunctional uterine bleeding is abnormal bleeding from the uterus. Dysfunctional uterine bleeding includes:  A period that comes earlier or later than usual.  A period that is lighter, heavier, or has blood clots.  Bleeding between periods.  Skipping one or more periods.  Bleeding after sexual intercourse.  Bleeding after menopause.  Follow these instructions at home: Pay attention to any changes in your symptoms. Follow these instructions to help with your condition: Eating and drinking  Eat well-balanced meals. Include foods that are high in iron, such as liver, meat, shellfish, green leafy vegetables, and eggs.  If you become constipated: ? Drink plenty of water. ? Eat fruits and vegetables that are high in water and fiber, such as spinach, carrots, raspberries, apples, and mango. Medicines  Take over-the-counter and prescription medicines only as told by your health care provider.  Do not change medicines without talking with your health care provider.  Aspirin or medicines that contain aspirin may make the bleeding worse. Do not take those medicines: ? During the week before your period. ? During your period.  If you were prescribed iron pills, take them as told by your health care provider. Iron pills help to replace iron that your body loses because of this condition. Activity  If you need to change your sanitary pad or tampon more than one time every 2 hours: ? Lie in bed with your feet raised (elevated). ? Place a cold pack on your lower abdomen. ? Rest as much as possible until the bleeding stops or slows down.  Do not try to lose weight until the bleeding has stopped and your blood iron level is back to normal. Other Instructions  For two months, write down: ? When your period starts. ? When your period ends. ? When any abnormal bleeding occurs. ? What problems you notice.  Keep all follow up visits as told by your health  care provider. This is important. Contact a health care provider if:  You get light-headed or weak.  You have nausea and vomiting.  You cannot eat or drink without vomiting.  You feel dizzy or have diarrhea while you are taking medicines.  You are taking birth control pills or hormones, and you want to change them or stop taking them. Get help right away if:  You develop a fever or chills.  You need to change your sanitary pad or tampon more than one time per hour.  Your bleeding becomes heavier, or your flow contains clots more often.  You develop pain in your abdomen.  You lose consciousness.  You develop a rash. This information is not intended to replace advice given to you by your health care provider. Make sure you discuss any questions you have with your health care provider. Document Released: 01/24/2000 Document Revised: 07/04/2015 Document Reviewed: 04/23/2014 Elsevier Interactive Patient Education  [employer].

## 2017-02-10 NOTE — Progress Notes (Signed)
Chief Complaint  Patient presents with  . right leg pain(groin)    x 2 weeks, per pt she can barely move around or sleep at night.  Tylenol is not helping with the pain.  Pain level 10/10.  Take a lot of effort to get up and pt is miserable.  . 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 DUB She was seen in the ER for DUB and was started on Provera on 02/06/17 which she is taking. She started her prescription on 02/10/2016.  She reports that the bleeding has not lessened.  She is still passing clots and is still having cramps.  She has a history of uterine fibroids and a year ago her IUD stopped her periods and now she was told the IUD was not seen on [place].    Cough and PE She also has the mediastinal widing on CT without a known cause She was started on Xarelto for anticoagulation for her PE but started having heavy bleeding and since her risk of clot was so great she was advised to take provera for 10 days and iron to help her hemoglobin. She continues to have a frequent nonproductive cough No fevers or chills She was treated with antibiotics for CAP then HCAP She has a  Long standing cough which was causing fatigue and shortness of breath  Right Sciatica She also has 2.5 Months of sciatica on the R which is only getting worse She is taking tylenol, heat and trying to stretch She is walking with a cane She has had dopplers which showed negative for dvt in the ER She describes her pain as groin pain   She has been off work since 01/14/2017.    Past Medical History:  Diagnosis Date  . Asthma   . 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 Pneumonia   . Pulmonary embolism ([place])     Current Outpatient Medications  Medication Sig Dispense Refill  . acetaminophen (TYLENOL) 500 MG tablet Take 1,000 mg by mouth every 6  (six) hours as needed for moderate pain.    . medroxyPROGESTERone (PROVERA) 10 MG tablet Take 1 tablet (10 mg total) by mouth daily. Use for ten days 10 tablet 0  . Rivaroxaban (XARELTO STARTER PACK) 15 & 20 MG TBPK Start with one 15mg  tablet by mouth twice daily with food. On Day 22, switch to one 20mg  tablet daily with food. 51 each 0  . albuterol (PROVENTIL HFA;VENTOLIN HFA) 108 (90 Base) MCG/ACT inhaler Inhale 1-2 puffs into the lungs every 6 (six) hours as needed for wheezing. (Patient not taking: Reported on 02/11/2017) 1 Inhaler 0  . albuterol (PROVENTIL) (5 MG/ML) 0.5% nebulizer solution Take 0.5 mLs (2.5 mg total) by nebulization every 6 (six) hours as needed for wheezing or shortness of breath. (Patient not taking: Reported on 02/11/2017) 20 mL 12  . ferrous sulfate (FERROUSUL) 325 (65 FE) MG tablet Take 1 tablet (325 mg total) by mouth daily with breakfast. (Patient not taking: Reported on 02/11/2017) 30 tablet 1  . guaifenesin (ROBITUSSIN) 100 MG/5ML syrup Take 12.5 mLs by mouth daily as needed for cough.    Marland Kitchen omeprazole (PRILOSEC) 20 MG capsule Take 20 mg by mouth 2 (two) times daily.     No current facility-administered medications for  this visit.     Allergies: No Known Allergies  Past Surgical History:  Procedure Laterality Date  . APPENDECTOMY    . BREAST SURGERY  breast reduction    Social History   Socioeconomic History  . Marital status: Single    Spouse name: None  . Number of children: None  . Years of education: None  . Highest education level: None  Social Needs  . Financial resource strain: None  . Food insecurity - worry: None  . Food insecurity - inability: None  . Transportation needs - medical: None  . Transportation needs - non-medical: None  Occupational History  . None  Tobacco Use  . Smoking status: Never Smoker  . Smokeless tobacco: Never Used  Substance and Sexual Activity  . Alcohol use: No    Comment: occasionally  . Drug use: No  . Sexual  activity: Yes    Birth control/protection: IUD  Other Topics Concern  . None  Social History Narrative  . None    Family History  Problem Relation Age of Onset  . Cancer Other   . Hypertension Other   . Arthritis Mother   . Colon cancer Father   . Breast cancer Maternal Aunt   . Colon cancer Paternal Uncle   . Stroke Maternal Grandmother   . Cancer Maternal Grandfather   . Hypertension Maternal Grandfather   . Alzheimer's disease Paternal Grandmother   . Breast cancer Maternal Aunt      Review of Systems  Constitutional: Positive for malaise/fatigue and weight loss. Negative for chills, diaphoresis and fever.  Respiratory: Positive for cough and shortness of breath. Negative for hemoptysis, sputum production and wheezing.   Cardiovascular: Negative for chest pain, palpitations and orthopnea.  Gastrointestinal: Positive for abdominal pain. Negative for blood in stool, constipation, diarrhea, nausea and vomiting.  Genitourinary: Negative for dysuria and urgency.  Neurological: Positive for weakness. Negative for dizziness, tingling, tremors and headaches.  Psychiatric/Behavioral: Negative for depression. The patient is not nervous/anxious.       Objective: Vitals:   02/11/17 0813  BP: 123/86  Pulse: 66  Resp: 18  Temp: 98.6 F (37 C)  TempSrc: Oral  SpO2: 95%  Weight: (!) 317 lb 6.4 oz (144 kg)  Height: 5\' 6"  (1.676 m)    Physical Exam  Constitutional: She is oriented to person, place, and time. She appears well-developed and well-nourished.  HENT:  Head: Normocephalic and atraumatic.  Eyes: Conjunctivae and EOM are normal.  Cardiovascular: Normal rate, regular rhythm and normal heart sounds.  No murmur heard. Pulmonary/Chest: Effort normal and breath sounds normal. No stridor. No respiratory distress. She has no wheezes.  Abdominal: Soft. Bowel sounds are normal. She exhibits no distension. There is no tenderness. There is no guarding.  Musculoskeletal:        Legs: Neurological: She is alert and oriented to person, place, and time.  Skin: Skin is warm. Capillary refill takes less than 2 seconds.  Psychiatric: She has a normal mood and affect. Her behavior is normal. Judgment and thought content normal.     FINDINGS: The bowel gas pattern is normal. No unexpected radio-opaque calculi or other significant radiographic abnormality are seen. Phleboliths noted over the anatomic pelvis bilaterally.  IMPRESSION: Negative.   Electronically Signed   By: Misty Stanley M.D.   On: 02/06/2017 13:16  IMPRESSION: Two uterine fibroids, largest measuring 7.1 cm.  Thickened heterogeneous endometrium measuring 24 mm. No IUD visualized.  Normal appearance of both ovaries.  No adnexal  mass identified.   Electronically Signed   By: Earle Gell M.D.   On: 02/06/2017 12:19  IMPRESSION: Extensive embolic disease within the right pulmonary arterial tree. Evidence of right ventricular strain. Positive for acute PE with CT evidence of right heart strain (RV/LV Ratio = 1.7) consistent with at least submassive (intermediate risk) PE. The presence of right heart strain has been associated with an increased risk of morbidity and mortality. Please activate Code PE by paging [phone].  Worsened consolidation and collapse throughout the left lower lobe. Patchy pneumonia present throughout the left upper lobe.  Bilateral hilar and subcarinal lymphadenopathy, presumably reactive. Cannot rule out the possibility of underlying process such as sarcoid.  Evidence of left ventricular hypertrophy.   Electronically Signed   By: Nelson Chimes M.D.   On: 01/17/2017 09:24  Days spent in the clinic and ER  01/17/2017, 01/25/17, 01/28/17, 02/01/2017, 02/04/2017, 02/06/2017   Assessment and Plan Aerielle was seen today for right leg pain(groin) and vaginal bleeding.  Diagnoses and all orders for this visit:  Anticoagulated- severe uterine  bleeding during menses on Xarelto  Continue Xarelto as the risk of clot is great  Mediastinal lymphadenopathy- discussed that she should continue with Pulm She is on a blood thinner so a bronchoscopy and biopsy would pose risk but they will discuss ways to mitigate the risk At this point she has a clot in the lung that is quite severe and continues to cough   Pulmonary embolism on right [facility])- continue Xarelto  Encounter for medication monitoring- discussed Provera  Discussed that without her IUD in place she should follow up with Gyne to discuss how to stop her periods  Right sided sciatica- continue tylenol, heat and massages Discussed tramadol for severe pain  Anemia - slow FE or Vitron C BID for iron supplementation  Discussed constipation and upset stomach as risk factors  DUB- follow up with Gyne to discuss estrogen free options for management of the bleeding She has a clot so cannot get estrogens  A total of 50 minutes were spent face-to-face with the patient during this encounter and over half of that time was spent on counseling and coordination of care.    [place]

## 2017-02-11 ENCOUNTER — Telehealth: Payer: Self-pay

## 2017-02-11 ENCOUNTER — Encounter: Payer: Self-pay | Admitting: Family Medicine

## 2017-02-11 ENCOUNTER — Ambulatory Visit: Payer: 59 | Admitting: Family Medicine

## 2017-02-11 ENCOUNTER — Other Ambulatory Visit: Payer: Self-pay

## 2017-02-11 VITALS — BP 123/86 | HR 66 | Temp 98.6°F | Resp 18 | Ht 66.0 in | Wt 317.4 lb

## 2017-02-11 DIAGNOSIS — N938 Other specified abnormal uterine and vaginal bleeding: Secondary | ICD-10-CM

## 2017-02-11 DIAGNOSIS — I2699 Other pulmonary embolism without acute cor pulmonale: Secondary | ICD-10-CM

## 2017-02-11 DIAGNOSIS — Z7901 Long term (current) use of anticoagulants: Secondary | ICD-10-CM

## 2017-02-11 DIAGNOSIS — R590 Localized enlarged lymph nodes: Secondary | ICD-10-CM

## 2017-02-11 DIAGNOSIS — M5431 Sciatica, right side: Secondary | ICD-10-CM

## 2017-02-11 DIAGNOSIS — Z5181 Encounter for therapeutic drug level monitoring: Secondary | ICD-10-CM

## 2017-02-11 DIAGNOSIS — D500 Iron deficiency anemia secondary to blood loss (chronic): Secondary | ICD-10-CM

## 2017-02-11 MED ORDER — TRAMADOL HCL 50 MG PO TABS: 50.0000 mg | ORAL_TABLET | Freq: Three times a day (TID) | ORAL | 0 refills | Status: AC | PRN

## 2017-02-11 NOTE — Patient Instructions (Addendum)
Slow Fe iron supplement or Vitron C iron supplement Take the iron supplement twice a day If you get constipation then you can do some prune juice.     IF you received an x-ray today, you will receive an invoice from [facility]. Please contact [facility] at [phone] with questions or concerns regarding your invoice.   IF you received labwork today, you will receive an invoice from [facility]. Please contact [facility] at [phone] with questions or concerns regarding your invoice.   Our billing staff will not be able to assist you with questions regarding bills from these companies.  You will be contacted with the lab results as soon as they are available. The fastest way to get your results is to activate your My Chart account. Instructions are located on the last page of this paperwork. If you have not heard from [place] regarding the results in 2 weeks, please contact this office.     Iron-Rich Diet Iron is a mineral that helps your body to produce hemoglobin. Hemoglobin is a protein in your red blood cells that carries oxygen to your body's tissues. Eating too little iron may cause you to feel weak and tired, and it can increase your risk for infection. Eating enough iron is necessary for your body's metabolism, muscle function, and nervous system. Iron is naturally found in many foods. It can also be added to foods or fortified in foods. There are two types of dietary iron:  Heme iron. Heme iron is absorbed by the body more easily than nonheme iron. Heme iron is found in meat, poultry, and fish.  Nonheme iron. Nonheme iron is found in dietary supplements, iron-fortified grains, beans, and vegetables.  You may need to follow an iron-rich diet if:  You have been diagnosed with iron deficiency or iron-deficiency anemia.  You have a condition that prevents you from absorbing dietary iron, such as: ? Infection in your intestines. ? Celiac disease. This involves  long-lasting (chronic) inflammation of your intestines.  You do not eat enough iron.  You eat a diet that is high in foods that impair iron absorption.  You have lost a lot of blood.  You have heavy bleeding during your menstrual cycle.  You are pregnant.  What is my plan? Your health care provider may help you to determine how much iron you need per day based on your condition. Generally, when a person consumes sufficient amounts of iron in the diet, the following iron needs are met:  Men. ? 76-40 years old: 11 mg per day. ? 72-25 years old: 8 mg per day.  Women. ? 61-61 years old: 15 mg per day. ? 63-65 years old: 18 mg per day. ? Over 86 years old: 8 mg per day. ? Pregnant women: 27 mg per day. ? Breastfeeding women: 9 mg per day.  What do I need to know about an iron-rich diet?  Eat fresh fruits and vegetables that are high in vitamin C along with foods that are high in iron. This will help increase the amount of iron that your body absorbs from food, especially with foods containing nonheme iron. Foods that are high in vitamin C include oranges, peppers, tomatoes, and mango.  Take iron supplements only as directed by your health care provider. Overdose of iron can be life-threatening. If you were prescribed iron supplements, take them with orange juice or a vitamin C supplement.  Cook foods in pots and pans that are made from iron.  Eat nonheme iron-containing  foods alongside foods that are high in heme iron. This helps to improve your iron absorption.  Certain foods and drinks contain compounds that impair iron absorption. Avoid eating these foods in the same meal as iron-rich foods or with iron supplements. These include: ? Coffee, black tea, and red wine. ? Milk, dairy products, and foods that are high in calcium. ? Beans, soybeans, and peas. ? Whole grains.  When eating foods that contain both nonheme iron and compounds that impair iron absorption, follow these tips  to absorb iron better. ? Soak beans overnight before cooking. ? Soak whole grains overnight and drain them before using. ? Ferment flours before baking, such as using yeast in bread dough. What foods can I eat? Grains Iron-fortified breakfast cereal. Iron-fortified whole-wheat bread. Enriched rice. Sprouted grains. Vegetables Spinach. Potatoes with skin. Green peas. Broccoli. Red and green bell peppers. Fermented vegetables. Fruits Prunes. Raisins. Oranges. Strawberries. Mango. Grapefruit. Meats and Other Protein Sources Beef liver. Oysters. Beef. Shrimp. [place]. Chicken. Farmers. Sardines. Chickpeas. Nuts. Tofu. Beverages Tomato juice. Fresh orange juice. Prune juice. Hibiscus tea. Fortified instant breakfast shakes. Condiments Tahini. Fermented soy sauce. Sweets and Desserts Black-strap molasses. Other Wheat germ. The items listed above may not be a complete list of recommended foods or beverages. Contact your dietitian for more options. What foods are not recommended? Grains Whole grains. Bran cereal. Bran flour. Oats. Vegetables Artichokes. Brussels sprouts. Kale. Fruits Blueberries. Raspberries. Strawberries. Figs. Meats and Other Protein Sources Soybeans. Products made from soy protein. Dairy Milk. Cream. Cheese. Yogurt. Cottage cheese. Beverages Coffee. Black tea. Red wine. Sweets and Desserts Cocoa. Chocolate. Ice cream. Other Basil. Oregano. Parsley. The items listed above may not be a complete list of foods and beverages to avoid. Contact your dietitian for more information. This information is not intended to replace advice given to you by your health care provider. Make sure you discuss any questions you have with your health care provider. Document Released: 09/09/2004 Document Revised: 08/16/2015 Document Reviewed: 08/23/2013 Elsevier Interactive Patient Education  [employer].

## 2017-02-11 NOTE — Telephone Encounter (Signed)
Started PA on cover my meds.  Key code is [id number] and should result in 3 business days.

## 2017-02-12 ENCOUNTER — Telehealth: Payer: Self-pay | Admitting: Obstetrics and Gynecology

## 2017-02-12 ENCOUNTER — Telehealth: Payer: Self-pay | Admitting: Family Medicine

## 2017-02-12 NOTE — Telephone Encounter (Signed)
Called pt to get her to reschedule her appt that she has with Dr. Nolon Rod on 03/04/17 at 8:20. Dr. Nolon Rod will be in after 9:00 that dat. Please make her an appt for a different day.  Thanks!

## 2017-02-12 NOTE — Telephone Encounter (Signed)
Spoke to patient about appointment on 02/17/2017 at 2:55 pm. Patient was told to bring her insurance card, list of medications, and to please arrive 10 mins. Early, no more than 15 mins. late. Patient stated she understood, and would be here.

## 2017-02-15 ENCOUNTER — Other Ambulatory Visit (INDEPENDENT_AMBULATORY_CARE_PROVIDER_SITE_OTHER): Payer: 59

## 2017-02-15 ENCOUNTER — Telehealth: Payer: Self-pay | Admitting: Acute Care

## 2017-02-15 ENCOUNTER — Ambulatory Visit: Payer: 59 | Admitting: Acute Care

## 2017-02-15 ENCOUNTER — Encounter (HOSPITAL_COMMUNITY): Payer: Self-pay

## 2017-02-15 ENCOUNTER — Observation Stay (HOSPITAL_COMMUNITY)
Admission: RE | Admit: 2017-02-15 | Discharge: 2017-02-16 | Disposition: A | Discharge status: Home / Self Care | Payer: 59 | Source: Ambulatory Visit | Attending: Obstetrics and Gynecology | Admitting: Obstetrics and Gynecology

## 2017-02-15 ENCOUNTER — Other Ambulatory Visit: Payer: Self-pay

## 2017-02-15 ENCOUNTER — Ambulatory Visit (INDEPENDENT_AMBULATORY_CARE_PROVIDER_SITE_OTHER)
Admission: RE | Admit: 2017-02-15 | Discharge: 2017-02-15 | Disposition: A | Discharge status: Home / Self Care | Payer: 59 | Source: Ambulatory Visit | Attending: Acute Care | Admitting: Acute Care

## 2017-02-15 ENCOUNTER — Ambulatory Visit (INDEPENDENT_AMBULATORY_CARE_PROVIDER_SITE_OTHER): Payer: 59 | Admitting: Obstetrics and Gynecology

## 2017-02-15 ENCOUNTER — Encounter: Payer: Self-pay | Admitting: Acute Care

## 2017-02-15 ENCOUNTER — Encounter: Payer: Self-pay | Admitting: Obstetrics and Gynecology

## 2017-02-15 VITALS — BP 123/70 | HR 131 | Ht 66.0 in | Wt 312.6 lb

## 2017-02-15 VITALS — BP 126/68 | HR 131 | Ht 66.0 in | Wt 314.0 lb

## 2017-02-15 DIAGNOSIS — Z8249 Family history of ischemic heart disease and other diseases of the circulatory system: Secondary | ICD-10-CM | POA: Insufficient documentation

## 2017-02-15 DIAGNOSIS — R590 Localized enlarged lymph nodes: Secondary | ICD-10-CM | POA: Diagnosis not present

## 2017-02-15 DIAGNOSIS — Z8261 Family history of arthritis: Secondary | ICD-10-CM | POA: Diagnosis not present

## 2017-02-15 DIAGNOSIS — N939 Abnormal uterine and vaginal bleeding, unspecified: Secondary | ICD-10-CM | POA: Diagnosis present

## 2017-02-15 DIAGNOSIS — Z800 Family history of malignant neoplasm of digestive organs: Secondary | ICD-10-CM | POA: Insufficient documentation

## 2017-02-15 DIAGNOSIS — I2699 Other pulmonary embolism without acute cor pulmonale: Secondary | ICD-10-CM

## 2017-02-15 DIAGNOSIS — Z79899 Other long term (current) drug therapy: Secondary | ICD-10-CM | POA: Diagnosis not present

## 2017-02-15 DIAGNOSIS — R58 Hemorrhage, not elsewhere classified: Secondary | ICD-10-CM

## 2017-02-15 DIAGNOSIS — R05 Cough: Secondary | ICD-10-CM | POA: Diagnosis not present

## 2017-02-15 DIAGNOSIS — R059 Cough, unspecified: Secondary | ICD-10-CM

## 2017-02-15 DIAGNOSIS — Z803 Family history of malignant neoplasm of breast: Secondary | ICD-10-CM | POA: Diagnosis not present

## 2017-02-15 DIAGNOSIS — Z9889 Other specified postprocedural states: Secondary | ICD-10-CM | POA: Diagnosis not present

## 2017-02-15 DIAGNOSIS — R053 Chronic cough: Secondary | ICD-10-CM | POA: Diagnosis present

## 2017-02-15 DIAGNOSIS — Z86711 Personal history of pulmonary embolism: Secondary | ICD-10-CM | POA: Diagnosis not present

## 2017-02-15 DIAGNOSIS — D649 Anemia, unspecified: Secondary | ICD-10-CM

## 2017-02-15 DIAGNOSIS — Z6841 Body Mass Index (BMI) 40.0 and over, adult: Secondary | ICD-10-CM | POA: Diagnosis not present

## 2017-02-15 DIAGNOSIS — Z9049 Acquired absence of other specified parts of digestive tract: Secondary | ICD-10-CM | POA: Insufficient documentation

## 2017-02-15 DIAGNOSIS — J45909 Unspecified asthma, uncomplicated: Secondary | ICD-10-CM | POA: Insufficient documentation

## 2017-02-15 DIAGNOSIS — D500 Iron deficiency anemia secondary to blood loss (chronic): Secondary | ICD-10-CM

## 2017-02-15 DIAGNOSIS — Z820 Family history of epilepsy and other diseases of the nervous system: Secondary | ICD-10-CM | POA: Insufficient documentation

## 2017-02-15 DIAGNOSIS — Z7901 Long term (current) use of anticoagulants: Secondary | ICD-10-CM | POA: Insufficient documentation

## 2017-02-15 DIAGNOSIS — Z79890 Hormone replacement therapy (postmenopausal): Secondary | ICD-10-CM | POA: Diagnosis not present

## 2017-02-15 LAB — CBC WITH DIFFERENTIAL/PLATELET
Basophils Absolute: 0 10*3/uL (ref 0.0–0.1)
Basophils Relative: 0.3 % (ref 0.0–3.0)
EOS PCT: 13.4 % — AB (ref 0.0–5.0)
Eosinophils Absolute: 1.8 10*3/uL — ABNORMAL HIGH (ref 0.0–0.7)
HEMATOCRIT: 26.5 % — AB (ref 36.0–46.0)
Hemoglobin: 8.5 g/dL — ABNORMAL LOW (ref 12.0–15.0)
LYMPHS PCT: 8.5 % — AB (ref 12.0–46.0)
Lymphs Abs: 1.1 10*3/uL (ref 0.7–4.0)
MCHC: 32.3 g/dL (ref 30.0–36.0)
MCV: 88.9 fl (ref 78.0–100.0)
Monocytes Absolute: 1 10*3/uL (ref 0.1–1.0)
Monocytes Relative: 7.5 % (ref 3.0–12.0)
NEUTROS ABS: 9.5 10*3/uL — AB (ref 1.4–7.7)
NEUTROS PCT: 70.3 % (ref 43.0–77.0)
Platelets: 256 10*3/uL (ref 150.0–400.0)
RBC: 2.98 Mil/uL — AB (ref 3.87–5.11)
RDW: 15.6 % — ABNORMAL HIGH (ref 11.5–15.5)
WBC: 13.5 10*3/uL — ABNORMAL HIGH (ref 4.0–10.5)

## 2017-02-15 LAB — CBC
HEMATOCRIT: 25.6 % — AB (ref 36.0–46.0)
Hemoglobin: 8.2 g/dL — ABNORMAL LOW (ref 12.0–15.0)
MCH: 28.4 pg (ref 26.0–34.0)
MCHC: 32 g/dL (ref 30.0–36.0)
MCV: 88.6 fL (ref 78.0–100.0)
Platelets: 239 10*3/uL (ref 150–400)
RBC: 2.89 MIL/uL — ABNORMAL LOW (ref 3.87–5.11)
RDW: 15.7 % — AB (ref 11.5–15.5)
WBC: 12.8 10*3/uL — ABNORMAL HIGH (ref 4.0–10.5)

## 2017-02-15 LAB — COMPREHENSIVE METABOLIC PANEL
ALT: 42 U/L (ref 14–54)
AST: 32 U/L (ref 15–41)
Albumin: 3.7 g/dL (ref 3.5–5.0)
Alkaline Phosphatase: 368 U/L — ABNORMAL HIGH (ref 38–126)
Anion gap: 12 (ref 5–15)
BUN: 14 mg/dL (ref 6–20)
CHLORIDE: 106 mmol/L (ref 101–111)
CO2: 21 mmol/L — ABNORMAL LOW (ref 22–32)
Calcium: 9.2 mg/dL (ref 8.9–10.3)
Creatinine, Ser: 0.63 mg/dL (ref 0.44–1.00)
GFR calc Af Amer: >60 mL/min (ref >60)
GFR calc non Af Amer: >60 mL/min (ref >60)
GLUCOSE: 110 mg/dL — AB (ref 65–99)
POTASSIUM: 3.9 mmol/L (ref 3.5–5.1)
Sodium: 139 mmol/L (ref 135–145)
Total Bilirubin: 0.4 mg/dL (ref 0.3–1.2)
Total Protein: 7.3 g/dL (ref 6.5–8.1)

## 2017-02-15 LAB — ABO/RH: ABO/RH(D): O POS

## 2017-02-15 LAB — PREPARE RBC (CROSSMATCH)

## 2017-02-15 MED ORDER — LACTATED RINGERS IV SOLN: INTRAVENOUS | Status: DC

## 2017-02-15 MED ORDER — RIVAROXABAN 20 MG PO TABS: 20.0000 mg | ORAL_TABLET | Freq: Every day | ORAL | 5 refills | Status: DC

## 2017-02-15 MED ORDER — ACETAMINOPHEN 325 MG PO TABS
650.0000 mg | ORAL_TABLET | Freq: Once | ORAL | Status: AC
  Administered 2017-02-15: 650 mg via ORAL
  Filled 2017-02-15: qty 2

## 2017-02-15 MED ORDER — HYDROCODONE-HOMATROPINE 5-1.5 MG/5ML PO SYRP
5.0000 mL | ORAL_SOLUTION | Freq: Four times a day (QID) | ORAL | Status: DC | PRN
  Administered 2017-02-15 – 2017-02-16 (×2): 5 mL via ORAL
  Filled 2017-02-15: qty 5

## 2017-02-15 MED ORDER — SODIUM CHLORIDE 0.9% FLUSH: 3.0000 mL | INTRAVENOUS | Status: DC | PRN

## 2017-02-15 MED ORDER — TRAMADOL HCL 50 MG PO TABS: 50.0000 mg | ORAL_TABLET | Freq: Four times a day (QID) | ORAL | Status: DC | PRN

## 2017-02-15 MED ORDER — HYDROCODONE-HOMATROPINE 5-1.5 MG/5ML PO SYRP: 5.0000 mL | ORAL_SOLUTION | Freq: Four times a day (QID) | ORAL | 0 refills | Status: DC | PRN

## 2017-02-15 MED ORDER — SODIUM CHLORIDE 0.9 % IV SOLN: 250.0000 mL | INTRAVENOUS | Status: DC | PRN

## 2017-02-15 MED ORDER — NORETHINDRONE ACETATE 5 MG PO TABS
10.0000 mg | ORAL_TABLET | ORAL | Status: DC
  Administered 2017-02-15 – 2017-02-16 (×5): 10 mg via ORAL
  Filled 2017-02-15 (×6): qty 2

## 2017-02-15 MED ORDER — SODIUM CHLORIDE 0.9 % IV SOLN
Freq: Once | INTRAVENOUS | Status: AC
  Administered 2017-02-15: 20:00:00 via INTRAVENOUS

## 2017-02-15 MED ORDER — SODIUM CHLORIDE 0.9% FLUSH: 3.0000 mL | Freq: Two times a day (BID) | INTRAVENOUS | Status: DC

## 2017-02-15 MED ORDER — DIPHENHYDRAMINE HCL 25 MG PO CAPS
25.0000 mg | ORAL_CAPSULE | Freq: Once | ORAL | Status: AC
  Administered 2017-02-15: 25 mg via ORAL
  Filled 2017-02-15: qty 1

## 2017-02-15 MED ORDER — SIMETHICONE 80 MG PO CHEW: 80.0000 mg | CHEWABLE_TABLET | Freq: Four times a day (QID) | ORAL | Status: DC | PRN

## 2017-02-15 MED ORDER — RIVAROXABAN 20 MG PO TABS
20.0000 mg | ORAL_TABLET | Freq: Every day | ORAL | Status: DC
  Administered 2017-02-16: 20 mg via ORAL
  Filled 2017-02-15: qty 1

## 2017-02-15 MED ORDER — SODIUM CHLORIDE 0.9 % IV SOLN
Freq: Once | INTRAVENOUS | Status: AC
  Administered 2017-02-15: 18:00:00 via INTRAVENOUS

## 2017-02-15 MED ORDER — DOCUSATE SODIUM 100 MG PO CAPS
100.0000 mg | ORAL_CAPSULE | Freq: Two times a day (BID) | ORAL | Status: DC
  Administered 2017-02-15 – 2017-02-16 (×2): 100 mg via ORAL
  Filled 2017-02-15 (×2): qty 1

## 2017-02-15 NOTE — Patient Instructions (Addendum)
It is nice to meet you today. We will do a CXR today. CBC today. Hydromet cough syrup for cough 5 cc's at bedtime for cough. Delsym during the day every 12 hours. Xaralto 15 mg twice daily x 2 additional days.( 1/8, 1/9) Then start Xaralto 20 mg once daily.( 1/10) Follow up with Dr. Lamonte Sakai this Friday to discuss bronch schedule. Follow up with Gynecology as is scheduled this Wednesday. Continue Provera 10 mg x 10 days per gynecology as you have been doing. Please contact office for sooner follow up if symptoms do not improve or worsen or seek emergency care

## 2017-02-15 NOTE — Assessment & Plan Note (Signed)
Uterine bleeding that started after treatment for PE Plan: I have called her GYN Dr. Aletha Halim office and asked for an earlier appointment Crystal Gallagher has been decreased to 20 mg daily Follow up Friday with Dr. Lamonte Sakai for repeat CBC  Go to maternity admissions for saturation of 1 pad per hour x 2 hours.

## 2017-02-15 NOTE — Telephone Encounter (Signed)
LMVM that Tramadol has been approved and patient can pick it up at the pharmacy.

## 2017-02-15 NOTE — Assessment & Plan Note (Addendum)
Follow up with Dr. Lamonte Sakai 1/11 to reschedule bronch

## 2017-02-15 NOTE — Progress Notes (Signed)
History of Present Illness Crystal Gallagher is a 37 y.o. female never smoker with obesity , asthma , chronic cough and PE diagnosed 01/17/2017. She is followed by Dr. Lamonte Sakai.   02/15/2017 Pt. Presents for follow up. She was seen 01/14/2017 , and 01/16/2017 by Dr. Lamonte Sakai for cough and pneumonia. She was scheduled for a bronch 12/10, and it was rescheduled due to snow for 12/14. However, patient on 01/17/2017 became more short of breath, and was passing out, so went to the ED at [facility], where she was diagnosed with a PE per CTA.. Pt had a sore right leg prior to that.She was admitted 12/9-12/15. She was treated with IV heparin, and oxygen, IV antibiotics. She was transitioned to warfarin and Lovenox, and then to [place] on 01/28/2017. She had a period 1 week after starting blood thinner. She was having heavy uterine bleeding. She was seen in the ED for bleeding at [facility].. She has followed up with gynocology, and has another appointment 02/17/2017.She has been taking Provera 10 mg since 02/09/2017 x 10 days. She states that from a breathing perspective she is doing well. She still has a cough. She states she feels weak and tired.She states she continues to bleed form her uterus. She states she is using 2 poise pads per hour. She is having bright red blood and clots.She is compliant with her Jennye Moccasin. She has 2 more days on 15 mg BID, and will transition to 20 mg once daily with food starting 02/18/2017. She denies any fever, chest pain, orthopnea or hemoptysis. She states her cough is rarely productive for secretions.  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 Evidence of right ventricular strain. Positive for acute PE with CT evidence of right heart strain (RV/LV Ratio = 1.7) consistent with at least submassive (intermediate risk) PE. The presence of right heart strain has been associated with an increased risk of morbidity and mortality.  Please activate Code PE by paging [phone].  Worsened consolidation and collapse throughout the left lower lobe. Patchy pneumonia present throughout the left upper lobe.  Bilateral hilar and subcarinal lymphadenopathy, presumably reactive. Cannot rule out the possibility of underlying process such as sarcoid.  Evidence of left ventricular hypertrophy.  CBC Latest Ref Rng & Units 02/15/2017 02/06/2017 02/04/2017  WBC 4.0 - 10.5 K/uL 13.5(H) 10.0 12.3(H)  Hemoglobin 12.0 - 15.0 g/dL 8.5 Repeated and verified X2.(L) 11.1(L) 13.4  Hematocrit 36.0 - 46.0 % 26.5(L) 34.1(L) 40.6  Platelets 150.0 - 400.0 K/uL 256.0 301 379    BMP Latest Ref Rng & Units 02/04/2017 02/04/2017 02/01/2017  Glucose 65 - 99 mg/dL 104(H) 103(H) 110(H)  BUN 6 - 20 mg/dL 9 10 10   Creatinine 0.44 - 1.00 mg/dL 0.62 0.53(L) 0.59  BUN/Creat Ratio 9 - 23 - 19 -  Sodium 135 - 145 mmol/L 136 138 139  Potassium 3.5 - 5.1 mmol/L 3.5 3.7 3.8  Chloride 101 - 111 mmol/L 104 104 110  CO2 22 - 32 mmol/L 20(L) 19(L) 22  Calcium 8.9 - 10.3 mg/dL 9.3 9.7 9.2    BNP    Component Value Date/Time   BNP 17.4 12/05/2016 1704    ProBNP No results found for: PROBNP  PFT No results found for: FEV1PRE, FEV1POST, FVCPRE, FVCPOST, TLC, DLCOUNC, PREFEV1FVCRT, PSTFEV1FVCRT  Dg Chest 2 View  Result Date: 02/15/2017 CLINICAL DATA:  Six months of cough. History of right-sided pulmonary embolism. History of a left-sided cavitary mass. Asthma. EXAM: CHEST  2  VIEW COMPARISON:  CT scan of the chest of January 17, 2017 and chest x-ray of the same day FINDINGS: The right lung is adequately inflated. The interstitial markings on the right are coarse. On the left there is persistent mild volume loss with increased density in the mid and lower lung. Probable persistent left pleural effusion. The heart and pulmonary vascularity are normal. IMPRESSION: Persistent parenchymal consolidation in the mid and lower left lung with probable pleural  effusion. Slight interval increase in the conspicuity of the interstitial markings of the right lung may reflect mild interstitial edema. Repeat chest CT scanning is recommended. Electronically Signed   By: David  Martinique M.D.   On: 02/15/2017 11:49   Dg Abd 1 View  Result Date: 02/06/2017 CLINICAL DATA:  Right groin pain. EXAM: ABDOMEN - 1 VIEW COMPARISON:  None. FINDINGS: The bowel gas pattern is normal. No unexpected radio-opaque calculi or other significant radiographic abnormality are seen. Phleboliths noted over the anatomic pelvis bilaterally. IMPRESSION: Negative. Electronically Signed   By: Misty Stanley M.D.   On: 02/06/2017 13:16   Ct Angio Chest Pe W/cm &/or Wo Cm  Result Date: 01/17/2017 CLINICAL DATA:  Shortness of breath and chest pain for the last several months. EXAM: CT ANGIOGRAPHY CHEST WITH CONTRAST TECHNIQUE: Multidetector CT imaging of the chest was performed using the standard protocol during bolus administration of intravenous contrast. Multiplanar CT image reconstructions and MIPs were obtained to evaluate the vascular anatomy. CONTRAST:  127mL ISOVUE-370 IOPAMIDOL (ISOVUE-370) INJECTION 76% COMPARISON:  Radiography same day.  CT 12/05/2016 FINDINGS: Cardiovascular: Pulmonary arterial opacification is good. 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 No visible emboli on the left. No aortic disease. No coronary artery calcification. Maximum right ventricular diameter is 4.3 cm. Maximal left ventricular diameter is 2.5 cm. This suggests right ventricular strain. Additionally, the patient appears to have left ventricular muscular hypertrophy. Mediastinum/Nodes: Enlarged subcarinal and bilateral hilar lymph nodes. Lungs/Pleura: Right lung is clear. Left lung shows complete consolidation of the lower lobe with collapse. Areas of patchy pneumonia present throughout the left upper lobe. Upper Abdomen: Negative Musculoskeletal: Negative Review of the  MIP images confirms the above findings. 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 Evidence of right ventricular strain. Positive for acute PE with CT evidence of right heart strain (RV/LV Ratio = 1.7) consistent with at least submassive (intermediate risk) PE. The presence of right heart strain has been associated with an increased risk of morbidity and mortality. Please activate Code PE by paging [phone]. Worsened consolidation and collapse throughout the left lower lobe. Patchy pneumonia present throughout the left upper lobe. Bilateral hilar and subcarinal lymphadenopathy, presumably reactive. Cannot rule out the possibility of underlying process such as sarcoid. Evidence of left ventricular hypertrophy. Electronically Signed   By: Nelson Chimes M.D.   On: 01/17/2017 09:24   Dg Chest Port 1 View  Result Date: 01/17/2017 CLINICAL DATA:  37 year old female with shortness of breath. EXAM: PORTABLE CHEST 1 VIEW COMPARISON:  01/04/2017 FINDINGS: Cardiomediastinal silhouette is partially obscured but grossly unchanged. There is worsening opacity at the left lower lobe consistent with airspace consolidation. The right lung is clear. Small left-sided effusion not excluded. No definite pneumothorax. No acute osseous abnormalities. IMPRESSION: Worsening left lower lobe consolidation. Continued follow-up recommended. Electronically Signed   By: Kristopher Oppenheim M.D.   On: 01/17/2017 06:59   US Pelvic Complete With Transvaginal  Result Date: 02/06/2017 CLINICAL DATA:  Abnormal uterine bleeding.  IUD.  Fibroids. EXAM: TRANSABDOMINAL AND TRANSVAGINAL ULTRASOUND OF PELVIS TECHNIQUE: Both transabdominal and transvaginal ultrasound examinations of the pelvis were performed. Transabdominal technique was performed for global imaging of the pelvis including uterus, ovaries, adnexal regions, and pelvic cul-de-sac. It was necessary to proceed with endovaginal exam following the  transabdominal exam to visualize the IUD and ovaries. COMPARISON:  None FINDINGS: Uterus Measurements: 10.5 x 6.3 x 9.9 cm. A large subserosal fibroid is seen in the left posterior corpus 7.1 cm in maximum diameter. A second fibroid is seen in the left anterior corpus measuring 2.1 cm. Endometrium Thickness: 24 mm. Heterogeneous appearance, but no focal abnormality visualized. No IUD visualized. Right ovary Measurements: 3.4 x 1.7 x 2.5 cm. Normal appearance/no adnexal mass. Left ovary Measurements: 3.3 x 1.8 x 1.8 cm. Normal appearance/no adnexal mass. Other findings No abnormal free fluid. IMPRESSION: Two uterine fibroids, largest measuring 7.1 cm. Thickened heterogeneous endometrium measuring 24 mm. No IUD visualized. Normal appearance of both ovaries.  No adnexal mass identified. Electronically Signed   By: Earle Gell M.D.   On: 02/06/2017 12:19     Past medical hx Past Medical History:  Diagnosis Date  . Asthma   . 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 Pneumonia   . Pulmonary embolism (HCC)      Social History   Tobacco Use  . Smoking status: Never Smoker  . Smokeless tobacco: Never Used  Substance Use Topics  . Alcohol use: No    Comment: occasionally  . Drug use: No    Ms.Montanye reports that  has never smoked. she has never used smokeless tobacco. She reports that she does not drink alcohol or use drugs.  Tobacco Cessation: Never smoker  Past surgical hx, Family hx, Social hx all reviewed.  Current Outpatient Medications on File Prior to Visit  Medication Sig  . acetaminophen (TYLENOL) 500 MG tablet Take 1,000 mg by mouth every 6 (six) hours as needed for moderate pain.  Marland Kitchen albuterol (PROVENTIL HFA;VENTOLIN HFA) 108 (90 Base) MCG/ACT inhaler Inhale 1-2 puffs into the lungs every 6 (six) hours as needed for wheezing.  Marland Kitchen albuterol (PROVENTIL) (5 MG/ML) 0.5% nebulizer solution Take 0.5 mLs (2.5 mg total) by nebulization every 6 (six) hours as needed for wheezing or  shortness of breath.  . ferrous sulfate (FERROUSUL) 325 (65 FE) MG tablet Take 1 tablet (325 mg total) by mouth daily with breakfast.  . medroxyPROGESTERone (PROVERA) 10 MG tablet Take 1 tablet (10 mg total) by mouth daily. Use for ten days  . omeprazole (PRILOSEC) 20 MG capsule Take 20 mg by mouth 2 (two) times daily.  . Rivaroxaban (XARELTO STARTER PACK) 15 & 20 MG TBPK Start with one 15mg  tablet by mouth twice daily with food. On Day 22, switch to one 20mg  tablet daily with food.  . traMADol (ULTRAM) 50 MG tablet Take 1 tablet (50 mg total) by mouth every 8 (eight) hours as needed.   No current facility-administered medications on file prior to visit.      No Known Allergies  Review Of Systems:  Constitutional:   No  weight loss, night sweats,  Fevers, chills, + fatigue, or +  lassitude.  HEENT:   No headaches,  Difficulty swallowing,  Tooth/dental problems, or  Sore throat,                No sneezing, itching, ear ache, nasal congestion, post nasal drip,   CV:  No chest pain,  Orthopnea, PND, swelling  in lower extremities, anasarca, dizziness, palpitations, syncope.   GI  No heartburn, indigestion, abdominal pain, nausea, vomiting, diarrhea, change in bowel habits, loss of appetite, bloody stools.   Resp: + shortness of breath with exertion less at rest.  No excess mucus, no productive cough,  + non-productive cough,  No coughing up of blood.  No change in color of mucus.  No wheezing.  No chest wall deformity  Skin: no rash or lesions.  GU: no dysuria, change in color of urine, no urgency or frequency.  No flank pain, no hematuria , heavy uterine bleeding  MS:  No joint pain or swelling.  No decreased range of motion.  No back pain.  Psych:  No change in mood or affect. No depression or anxiety.  No memory loss.   Vital Signs BP 126/68 (BP Location: Left Arm, Cuff Size: Normal)   Pulse (!) 131   Ht 5\' 6"  (1.676 m)   Wt (!) 314 lb (142.4 kg)   LMP 02/01/2017 (Approximate)    SpO2 96%   BMI 50.68 kg/m    Physical Exam:  General- No distress,  A&Ox3, overweight female in wheel chair ENT: No sinus tenderness, TM clear, pale nasal mucosa, no oral exudate,no post nasal drip, no LAN Cardiac: S1, S2, regular rate and rhythm, no murmur Chest: No wheeze/ rales/ dullness; no accessory muscle use, no nasal flaring, no sternal retractions Abd.: Soft Non-tender, obese, non-distended Ext: No clubbing cyanosis, edema Neuro:  normal strength, MAE x 4, Cranial nerves intact, deconditioned Skin: No rashes, warm and dry Psych: normal mood and behavior   Assessment/Plan  Pulmonary embolism on right (HCC) Compliant with Xaralto for PE Uterine Bleeding Plan: We will do a CXR today. CBC today. Hydromet cough syrup for cough 5 cc's at bedtime for cough. Delsym during the day every 12 hours. Do not take second 15 mg Xaralto tablet today. Then start Xaralto 20 mg once daily.( 1/8) Follow up with Dr. Lamonte Sakai this Friday to discuss bronch schedule, and to check another CBC Follow up with Gynecology as is scheduled this Wednesday or sooner. Continue Provera 10 mg x 10 days per gynecology as you have been doing. Please contact office for sooner follow up if symptoms do not improve or worsen or seek emergency care     Uterine bleeding Uterine bleeding that started after treatment for PE Plan: I have called her GYN Dr. Aletha Halim office and asked for an earlier appointment Jennye Moccasin has been decreased to 20 mg daily Follow up Friday with Dr. Lamonte Sakai for repeat CBC  Go to maternity admissions for saturation of 1 pad per hour x 2 hours.    Mediastinal lymphadenopathy Follow up with Dr. Lamonte Sakai 1/10 to reschedule bronch    Magdalen Spatz, NP 02/15/2017  2:07 PM

## 2017-02-15 NOTE — Telephone Encounter (Signed)
Order placed for CBC.

## 2017-02-15 NOTE — Progress Notes (Signed)
Crystal Gallagher is a 37 yo G1P0010 who presents to the clinic today with c/o vaginal bleeding. Pt was Dx with PE 01/17/17. She is currently on Xarelto qd. Vaginal bleeding started 01/28/17 and has continued Changes 2 pads/hr Was seen in MAU on 12/29 U/S thicken endometrium. H/O IUD in the past but not seen on U/S or pelvic Xray. Was started on Provera 10 mg qd but bleeding has continued Hgb 11.1 to 8.5 from 12/29 to today She c/o being tied and SOB. Saw pulmonary today  PE AF  Lungs clear Heart tachycardic Abd soft +BS GU nl EGBUS, blood clot in vagina noted, uterus 8-10 week size, no masses limited by pt habitus  EMBX  R/B reviewed Consent obtained Specillum placed. Cervix cleaned with betadine Single tooth tenaculum placed. Pipelle inserted into endometrial cavity  Sized 9 weeks. Tissue obtained. To pathology Instrutments removed. Pt tolerated well.    A/P Vaginal bleeding in setting of anticoagulation        Anemia secondary to above        Recent PE        Morbid obesity          Recommend admit to hospital for blood transfusion and further medical attempts to stop her vaginal bleeding. EMBX completed today. U/S already performed. Discussed with pt who is agreeable to admission and transfusion. Discussed with Dr. Rosana Hoes attending for Gyn services.

## 2017-02-15 NOTE — Assessment & Plan Note (Signed)
Compliant with Jennye Moccasin for PE Uterine Bleeding Plan: We will do a CXR today. CBC today. Hydromet cough syrup for cough 5 cc's at bedtime for cough. Delsym during the day every 12 hours. Do not take second 15 mg Xaralto tablet today. Then start Xaralto 20 mg once daily.( 1/8) Follow up with Dr. Lamonte Sakai this Friday to discuss bronch schedule, and to check another CBC Follow up with Gynecology as is scheduled this Wednesday or sooner. Continue Provera 10 mg x 10 days per gynecology as you have been doing. Please contact office for sooner follow up if symptoms do not improve or worsen or seek emergency care

## 2017-02-15 NOTE — H&P (Signed)
OB/GYN History and Physical  Crystal Gallagher is a 37 y.o. G1P0010 admitted for heavy vaginal bleeding. Pt was dx with PE 01/17/17. She is currently on Xarelto qd. Vaginal bleeding started 01/28/17 and has continued, last seen in MAU on 12/29 for bleeding. Thickened endometrium of 24 on [place] 12/29. H/O IUD in the past but not seen on U/S or pelvic Xray. Was started on Provera 10 mg qd but bleeding has continued.   Saw pulmonary today for ongoing eval for CAP, persistent cough, left lower lobe infiltrate with planned bronchoscopy with subsequent PE diagnosis  At pulm office, she c/o being tied and SOB. She feels SOB and fatigue have increased in last few days. Hgb 11.1 to 8.5 from 12/29 to today. Seen urgently in GYN office and noted to have blood clot at os, EMB completed in office.       Past Medical History:  Diagnosis Date  . Asthma   . 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 Pneumonia   . Pulmonary embolism [facility])     Past Surgical History:  Procedure Laterality Date  . APPENDECTOMY    . BREAST SURGERY  breast reduction    OB History  Gravida Para Term Preterm AB Living  1 0 0 0 1 0  SAB TAB Ectopic Multiple Live Births  1 0 0 0 0    # Outcome Date GA Lbr Len/2nd Weight Sex Delivery Anes PTL Lv  1 SAB               Social History   Socioeconomic History  . Marital status: Single    Spouse name: Not on file  . Number of children: Not on file  . Years of education: Not on file  . Highest education level: Not on file  Social Needs  . Financial resource strain: Not on file  . Food insecurity - worry: Not on file  . Food insecurity - inability: Not on file  . Transportation needs - medical: Not on file  . Transportation needs - non-medical: Not on file  Occupational History  . Not on file  Tobacco Use  . Smoking status: Never Smoker  . Smokeless tobacco: Never Used  Substance and Sexual Activity  . Alcohol use: No    Comment: occasionally  . Drug use: No  .  Sexual activity: Yes    Birth control/protection: IUD  Other Topics Concern  . Not on file  Social History Narrative  . Not on file    Family History  Problem Relation Age of Onset  . Cancer Other   . Hypertension Other   . Arthritis Mother   . Colon cancer Father   . Breast cancer Maternal Aunt   . Colon cancer Paternal Uncle   . Stroke Maternal Grandmother   . Cancer Maternal Grandfather   . Hypertension Maternal Grandfather   . Alzheimer's disease Paternal Grandmother   . Breast cancer Maternal Aunt     Medications Prior to Admission  Medication Sig Dispense Refill Last Dose  . acetaminophen (TYLENOL) 500 MG tablet Take 1,000 mg by mouth every 6 (six) hours as needed for moderate pain.   Taking  . albuterol (PROVENTIL HFA;VENTOLIN HFA) 108 (90 Base) MCG/ACT inhaler Inhale 1-2 puffs into the lungs every 6 (six) hours as needed for wheezing. (Patient not taking: Reported on 02/15/2017) 1 Inhaler 0 Not Taking  . albuterol (PROVENTIL) (5 MG/ML) 0.5% nebulizer solution Take 0.5 mLs (2.5 mg total) by nebulization every  6 (six) hours as needed for wheezing or shortness of breath. (Patient not taking: Reported on 02/15/2017) 20 mL 12 Not Taking  . ferrous sulfate (FERROUSUL) 325 (65 FE) MG tablet Take 1 tablet (325 mg total) by mouth daily with breakfast. 30 tablet 1 Taking  . HYDROcodone-homatropine (HYCODAN) 5-1.5 MG/5ML syrup Take 5 mLs by mouth every 6 (six) hours as needed for cough. 240 mL 0 Taking  . medroxyPROGESTERone (PROVERA) 10 MG tablet Take 1 tablet (10 mg total) by mouth daily. Use for ten days 10 tablet 0 Taking  . omeprazole (PRILOSEC) 20 MG capsule Take 20 mg by mouth 2 (two) times daily.   Taking  . Rivaroxaban (XARELTO STARTER PACK) 15 & 20 MG TBPK Start with one 15mg  tablet by mouth twice daily with food. On Day 22, switch to one 20mg  tablet daily with food. 51 each 0 Taking  . rivaroxaban (XARELTO) 20 MG TABS tablet Take 1 tablet (20 mg total) by mouth daily with supper.  (Patient not taking: Reported on 02/15/2017) 30 tablet 5 Not Taking  . traMADol (ULTRAM) 50 MG tablet Take 1 tablet (50 mg total) by mouth every 8 (eight) hours as needed. 30 tablet 0 Taking    No Known Allergies  Review of Systems: Negative except for what is mentioned in HPI.     Physical Exam: BP (!) 110/58 (BP Location: Left Arm)   Pulse (!) 135   Temp 99.9 F (37.7 C) (Oral)   Resp (!) 24   Ht 5' 5.98" (1.676 m)   Wt (!) 312 lb (141.5 kg)   LMP 02/01/2017 (Approximate)   SpO2 98%   BMI 50.38 kg/m   LMP 02/01/2017 (Approximate)  CONSTITUTIONAL: Well-developed, well-nourished female in mild distress. With cough  HENT:  Normocephalic, atraumatic, External right and left ear normal. Oropharynx is clear and moist EYES: Conjunctivae and EOM are normal. Pupils are equal, round, and reactive to light. No scleral icterus.  NECK: Normal range of motion, supple, no masses SKIN: Skin is warm and dry. No rash noted. Not diaphoretic. No erythema. No pallor. [place]: Alert and oriented to person, place, and time. Normal reflexes, muscle tone coordination. No cranial nerve deficit noted. PSYCHIATRIC: Normal mood and affect. Normal behavior. Normal judgment and thought content. CARDIOVASCULAR: Normal heart rate noted, regular rhythm RESPIRATORY: Effort mildly labored, no breath sounds LL lobe, clear throughout rest of lobes ABDOMEN: Soft, nontender, nondistended, gravid.  PELVIC: Deferred MUSCULOSKELETAL: Normal range of motion. No edema and no tenderness. 2+ distal pulses.   Pertinent Labs/Studies:   CBC Latest Ref Rng & Units 02/15/2017 02/06/2017 02/04/2017  WBC 4.0 - 10.5 K/uL 13.5(H) 10.0 12.3(H)  Hemoglobin 12.0 - 15.0 g/dL 8.5 Repeated and verified X2.(L) 11.1(L) 13.4  Hematocrit 36.0 - 46.0 % 26.5(L) 34.1(L) 40.6  Platelets 150.0 - 400.0 K/uL 256.0 301 379        Assessment and Plan :Crystal Gallagher is a 37 y.o. G1P0010 admitted for heavy vaginal bleedign in the setting of  PE on xarelto with notable history for persistent cough, h/o CAP with left lower lobe infiltrate planned for bronchoscopy.   H/H this am 8.5/26.5 but she reports being increasingly symptomatic over last few days. H/H pending now but as patient is increasingly symptomatic with acute PE, recommend transfusion. Reviewed risks of blood transfusion including transfusion reaction and small risk of transmission of blood borne pathogens including but not limited to HIV and Hepatitis. She is agreeable to blood transfusion. Will also switch to aygestin to stop  heavy vaginal bleeding. Xarelto ordered per NP notes from pulmonary today (20 mg daily, which was decreased from 15 mg BID)  Plan for blood transfusion Serial H/H Aygestin 10 mg Q4 Regular diet Activity as tolerated Tramadol ordered prn   K. Arvilla Meres, M.D. Attending [place], [facility] for [employer], [place]

## 2017-02-15 NOTE — Telephone Encounter (Signed)
I have called patient to let her know that the hemoglobin was checked this morning in the office was 8.5.  The patient has had heavy uterine bleeding since starting on Xarelto for pulmonary embolism with RV strain.  I explained that I have called Dr. Aletha Halim office, and the patient is to call [phone] to see if an earlier appointment as possible.  They had previously prescribed Provera 10 mg once daily for 10 days, which she has been taking since February 09, 2017.  She is currently scheduled to see him on Wednesday, January 9.  I have discussed this case with Dr. Lamonte Sakai , who the patient had seen previously in our office, and we have agreed that we will decrease the patient's Xarelto to 20 mg daily starting tomorrow.  This is 2 days earlier and had previously been prescribed.  She has been on 15 mg twice daily and was due to start 20 mg once daily on January 10th. This change is being made due to to the increase in uterine bleeding, and the 2-1/2 g decrease in her hemoglobin over the last 9 days.  We will check a CBC at her Friday, January 11 appointment with Dr. Lamonte Sakai .  At this point if she continues to bleed, we will discuss with her the possibilities of an IVC filter. She verbalized understanding of the above. I told her to make sure she told the GYN she is on xaralto for PE, and that care needs to be taken to avoid medications that increase the risk of extension of her PE. She verbalized understanding and states she will make sure they are aware. They will have access to my office note in Epic.   Crystal Gallagher, please place order for CBC 02/19/2017 prior to appointment with Dr. Lamonte Sakai.

## 2017-02-16 ENCOUNTER — Encounter: Payer: Self-pay | Admitting: *Deleted

## 2017-02-16 DIAGNOSIS — N939 Abnormal uterine and vaginal bleeding, unspecified: Secondary | ICD-10-CM | POA: Diagnosis not present

## 2017-02-16 DIAGNOSIS — D500 Iron deficiency anemia secondary to blood loss (chronic): Secondary | ICD-10-CM | POA: Diagnosis not present

## 2017-02-16 LAB — TYPE AND SCREEN
ABO/RH(D): O POS
Antibody Screen: NEGATIVE
Unit division: 0
Unit division: 0

## 2017-02-16 LAB — CBC
HEMATOCRIT: 29.6 % — AB (ref 36.0–46.0)
Hemoglobin: 9.8 g/dL — ABNORMAL LOW (ref 12.0–15.0)
MCH: 29 pg (ref 26.0–34.0)
MCHC: 33.1 g/dL (ref 30.0–36.0)
MCV: 87.6 fL (ref 78.0–100.0)
Platelets: 202 10*3/uL (ref 150–400)
RBC: 3.38 MIL/uL — AB (ref 3.87–5.11)
RDW: 15.3 % (ref 11.5–15.5)
WBC: 16.2 10*3/uL — AB (ref 4.0–10.5)

## 2017-02-16 LAB — BPAM RBC
BLOOD PRODUCT EXPIRATION DATE: 201901192359
Blood Product Expiration Date: 201901192359
ISSUE DATE / TIME: 201901072006
ISSUE DATE / TIME: 201901072258
UNIT TYPE AND RH: 5100
UNIT TYPE AND RH: 5100

## 2017-02-16 MED ORDER — NORETHINDRONE ACETATE 5 MG PO TABS: 10.0000 mg | ORAL_TABLET | Freq: Every day | ORAL | 1 refills | Status: AC

## 2017-02-16 NOTE — Discharge Summary (Signed)
Physician Discharge Summary  Patient ID: Crystal Gallagher MRN: [id number] DOB/AGE: 1980/07/29 37 y.o.  Admit date: 02/15/2017 Discharge date: 02/16/2017  Admission Diagnoses: abnormal uterine bleeding  Discharge Diagnoses:  Active Problems:   Morbid obesity with BMI of 45.0-49.9, adult (HCC)   Chronic cough   Pulmonary embolism on right (HCC)   Anemia   Abnormal vaginal bleeding   Discharged Condition: fair  Hospital Course: Please see HPI dated 02/15/2017 for full details. Briefly, this is a 37 y.o. G24P0010 female admitted for symptomatic anemia secondary to abnormal uterine bleeding in setting of PE on xarelto. She received 2 units pRBCs and bleeding has essentially stopped on norethindrone. She is feeling much better this am, reports feeling much less fatigued, her SOB has improved. She is able to get to restroom without feeling quite so tired. She is voiding, tolerating regular diet and overall, feeling much better. She was discharged home HD#2 in fair condition. She will f/u in office on Monday, to f/u with pulmonology on Friday.  Medical history significant for PE, CAP, lung infiltrate.  Physical exam  Vitals:   02/16/17 0200 02/16/17 0622 02/16/17 0759 02/16/17 0820  BP: 125/66 123/74 (!) 144/82   Pulse: (!) 114 (!) 113 (!) 115 (!) 122  Resp: 18 18 (!) 27   Temp: 98.8 F (37.1 C) 99.4 F (37.4 C) 98.7 F (37.1 C)   TempSrc: Oral Oral Oral   SpO2: 95% 98% 95% 97%  Weight:      Height:       BP (!) 144/82 (BP Location: Right Arm)   Pulse (!) 122   Temp 98.7 F (37.1 C) (Oral)   Resp (!) 27   Ht 5' 5.98" (1.676 m)   Wt (!) 310 lb (140.6 kg)   LMP 02/01/2017 (Approximate)   SpO2 97%   BMI 50.06 kg/m  CONSTITUTIONAL: Well-developed, well-nourished female in no acute distress.  HENT:  Normocephalic, atraumatic, External right and left ear normal. Oropharynx is clear and moist EYES: Conjunctivae and EOM are normal. Pupils are equal, round, and reactive to light. No  scleral icterus.  NECK: Normal range of motion, supple, no masses.  Normal thyroid.  SKIN: Skin is warm and dry. No rash noted. Not diaphoretic. No erythema. No pallor. NEUROLOGIC: Alert and oriented to person, place, and time. Normal reflexes, muscle tone coordination. No cranial nerve deficit noted. PSYCHIATRIC: Normal mood and affect. Normal behavior. Normal judgment and thought content. CARDIOVASCULAR: slightly tachycardic heart rate noted, regular rhythm RESPIRATORY: Clear to auscultation with air movement noted in all lung field except left lower lobe. Effort mildly labored ABDOMEN: Soft, normal bowel sounds, no distention noted.  No tenderness, rebound or guarding.  PELVIC: deferred MUSCULOSKELETAL: Normal range of motion. No tenderness.  No cyanosis, clubbing, or edema.     Labs: Lab Results  Component Value Date   WBC 16.2 (H) 02/16/2017   HGB 9.8 (L) 02/16/2017   HCT 29.6 (L) 02/16/2017   MCV 87.6 02/16/2017   PLT 202 02/16/2017   CMP Latest Ref Rng & Units 02/15/2017  Glucose 65 - 99 mg/dL 110(H)  BUN 6 - 20 mg/dL 14  Creatinine 0.44 - 1.00 mg/dL 0.63  Sodium 135 - 145 mmol/L 139  Potassium 3.5 - 5.1 mmol/L 3.9  Chloride 101 - 111 mmol/L 106  CO2 22 - 32 mmol/L 21(L)  Calcium 8.9 - 10.3 mg/dL 9.2  Total Protein 6.5 - 8.1 g/dL 7.3  Total Bilirubin 0.3 - 1.2 mg/dL 0.4  Alkaline Phos  38 - 126 U/L 368(H)  AST 15 - 41 U/L 32  ALT 14 - 54 U/L 42      Disposition: 01-Home or Self Care  Discharge Instructions    Diet - low sodium heart healthy   Complete by:  As directed    Increase activity slowly   Complete by:  As directed      Allergies as of 02/16/2017   No Known Allergies     Medication List    STOP taking these medications   medroxyPROGESTERone 10 MG tablet Commonly known as:  PROVERA     TAKE these medications   acetaminophen 500 MG tablet Commonly known as:  TYLENOL Take 1,000 mg by mouth every 6 (six) hours as needed for moderate pain.     albuterol 108 (90 Base) MCG/ACT inhaler Commonly known as:  PROVENTIL HFA;VENTOLIN HFA Inhale 1-2 puffs into the lungs every 6 (six) hours as needed for wheezing.   albuterol (5 MG/ML) 0.5% nebulizer solution Commonly known as:  PROVENTIL Take 0.5 mLs (2.5 mg total) by nebulization every 6 (six) hours as needed for wheezing or shortness of breath.   ferrous sulfate 325 (65 FE) MG tablet Commonly known as:  FERROUSUL Take 1 tablet (325 mg total) by mouth daily with breakfast.   HYDROcodone-homatropine 5-1.5 MG/5ML syrup Commonly known as:  HYCODAN Take 5 mLs by mouth every 6 (six) hours as needed for cough.   norethindrone 5 MG tablet Commonly known as:  AYGESTIN Take 2 tablets (10 mg total) by mouth daily.   omeprazole 20 MG capsule Commonly known as:  PRILOSEC Take 20 mg by mouth 2 (two) times daily.   rivaroxaban 20 MG Tabs tablet Commonly known as:  XARELTO Take 1 tablet (20 mg total) by mouth daily with supper. What changed:  Another medication with the same name was removed. Continue taking this medication, and follow the directions you see here.   traMADol 50 MG tablet Commonly known as:  ULTRAM Take 1 tablet (50 mg total) by mouth every 8 (eight) hours as needed. What changed:  reasons to take this        Follow-up Information    Sloan Leiter, MD. Go on 02/22/2017.   Specialty:  Obstetrics and Gynecology Why:  at 10:35 am. Please call with any questions or if you need to change this appointment. Contact information: [place] [address] [phone]           Signed: Sloan Leiter 02/16/2017, 9:55 AM

## 2017-02-16 NOTE — Progress Notes (Signed)
Pt out with mom in wheelchair  Teaching complete

## 2017-02-16 NOTE — Plan of Care (Signed)
  Education: Knowledge of General Education information will improve 02/16/2017 0312 - Progressing by Lars Masson, RN   Activity: Risk for activity intolerance will decrease 02/16/2017 0312 - Progressing by Lars Masson, RN   Coping: Level of anxiety will decrease 02/16/2017 0312 - Progressing by Lars Masson, RN   Skin Integrity: Risk for impaired skin integrity will decrease 02/16/2017 0312 - Progressing by Lars Masson, RN

## 2017-02-17 ENCOUNTER — Telehealth: Payer: Self-pay | Admitting: Family Medicine

## 2017-02-17 ENCOUNTER — Encounter: Payer: 59 | Admitting: Obstetrics and Gynecology

## 2017-02-17 NOTE — Telephone Encounter (Signed)
Patient needs FMLA forms completed by Dr Nolon Rod for most recent OV's. I completed what I could from the [employer] notes and highlighted the areas I was not sure about. I was not sure if she was getting this for the bleeding or the sciatica pain. I will give the forms to Dr Nolon Rod to be completed within 5-7 business days and returned to the FMLA/Disability desk in the back office at 104. Thank you!

## 2017-02-19 ENCOUNTER — Ambulatory Visit: Payer: 59

## 2017-02-19 ENCOUNTER — Ambulatory Visit: Payer: 59 | Admitting: Emergency Medicine

## 2017-02-19 ENCOUNTER — Encounter: Payer: Self-pay | Admitting: Emergency Medicine

## 2017-02-19 DIAGNOSIS — I2699 Other pulmonary embolism without acute cor pulmonale: Secondary | ICD-10-CM

## 2017-02-19 DIAGNOSIS — R918 Other nonspecific abnormal finding of lung field: Secondary | ICD-10-CM | POA: Diagnosis not present

## 2017-02-19 DIAGNOSIS — N939 Abnormal uterine and vaginal bleeding, unspecified: Secondary | ICD-10-CM

## 2017-02-19 DIAGNOSIS — R053 Chronic cough: Secondary | ICD-10-CM

## 2017-02-19 DIAGNOSIS — R05 Cough: Secondary | ICD-10-CM

## 2017-02-19 NOTE — Assessment & Plan Note (Signed)
This is a new diagnosis and she will need a heparin bridge in order to facilitate discontinuation of Xarelto, procedure to evaluate her left lower lobe.  Unclear if this was a provoked clot, I am concerned about possible malignancy.  Duration of her therapy will be determined by our ongoing workup.

## 2017-02-19 NOTE — Assessment & Plan Note (Signed)
This is now resolved.  She is on maintenance dose Xarelto 20 mg daily at this point.  Her hemoglobin had stabilized on recheck 1/8.  I do not feel like we need to recheck it again today.

## 2017-02-19 NOTE — Assessment & Plan Note (Signed)
This is persisted and has now progressed to left lower lobe consolidation.  I am concerned about a possible endobronchial lesion.  She needs bronchoscopy and I do not want to delay.  We will admit her for heparin bridging and then bronchoscopy.

## 2017-02-19 NOTE — Patient Instructions (Addendum)
We will plan to admit you to the hospital next week in order to transition your Xarelto over to heparin.  This will allow [place] to perform bronchoscopy to evaluate your left lower lobe.  We will plan to stop the Xarelto on Tuesday, perform the procedure on Wednesday.

## 2017-02-19 NOTE — Progress Notes (Signed)
Subjective:    Patient ID: Crystal Gallagher, female    DOB: Aug 02, 1980, 37 y.o.   MRN: [id number]  Cough  Associated symptoms include a sore throat and shortness of breath. Pertinent negatives include no ear pain, eye redness, fever, headaches, postnasal drip, rash, rhinorrhea or wheezing.       ROV 02/19/17 --37 year old woman, never smoker, seen by me for chronic cough and found to have a persistent non-resolving left lower lobe infiltrate.  The infiltrate did not resolve even though she was treated with antibiotics.  I had arrange for bronchoscopy with this was canceled for various reasons including the weather.  Unfortunately she continued to have dyspnea and was diagnosed on 01/17/17 with an acute pulmonary embolism by CT pulmonary angiogram.  She was admitted the hospital and ultimately started on Xarelto.  She was seen in our office earlier this week with traumatic anemia due to uterine bleeding on the Xarelto, hemoglobin down to 8.2 from 11.1 on 12/29.  Her dose has been decreased to 20 mg daily.  She states that her uterine bleeding has resolved and that she does feel better.  She has a visit with OB/GYN pending.        I reviewed the CT scan of her chest from the hospital on 12/9 > her left lower lobe infiltrate has progressed now to full consolidation with an apparent cutoff sign in the left lower lobe airway.  I am concerned about a possible endobronchial lesion.   She continues to have left-sided discomfort.  Continues to have dry cough.   Review of Systems  Constitutional: Negative for fever and unexpected weight change.  HENT: Positive for sore throat. Negative for congestion, dental problem, ear pain, nosebleeds, postnasal drip, rhinorrhea, sinus pressure, sneezing and trouble swallowing.   Eyes: Negative for redness and itching.  Respiratory: Positive for cough and shortness of breath. Negative for chest tightness and wheezing.   Cardiovascular: Negative for palpitations and leg  swelling.  Gastrointestinal: Negative for nausea and vomiting.  Genitourinary: Negative for dysuria.  Musculoskeletal: Negative for joint swelling.  Skin: Negative for rash.  Neurological: Negative for headaches.  Hematological: Does not bruise/bleed easily.  Psychiatric/Behavioral: Negative for dysphoric mood. The patient is not nervous/anxious.     Past Medical History:  Diagnosis Date  . Asthma   . 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 Pneumonia   . Pulmonary embolism (HCC)      Family History  Problem Relation Age of Onset  . Cancer Other   . Hypertension Other   . Arthritis Mother   . Colon cancer Father   . Breast cancer Maternal Aunt   . Colon cancer Paternal Uncle   . Stroke Maternal Grandmother   . Cancer Maternal Grandfather   . Hypertension Maternal Grandfather   . Alzheimer's disease Paternal Grandmother   . Breast cancer Maternal Aunt      Social History   Socioeconomic History  . Marital status: Single    Spouse name: Not on file  . Number of children: Not on file  . Years of education: Not on file  . Highest education level: Not on file  Social Needs  . Financial resource strain: Not on file  . Food insecurity - worry: Not on file  . Food insecurity - inability: Not on file  . Transportation needs - medical: Not on file  . Transportation needs - non-medical: Not on file  Occupational History  . Not on file  Tobacco  Use  . Smoking status: Never Smoker  . Smokeless tobacco: Never Used  Substance and Sexual Activity  . Alcohol use: No    Comment: occasionally  . Drug use: No  . Sexual activity: Yes    Birth control/protection: IUD  Other Topics Concern  . Not on file  Social History Narrative  . Not on file     No Known Allergies   Outpatient Medications Prior to Visit  Medication Sig Dispense Refill  . acetaminophen (TYLENOL) 500 MG tablet Take 1,000 mg by mouth every 6 (six) hours as needed for moderate pain.    Marland Kitchen albuterol  (PROVENTIL HFA;VENTOLIN HFA) 108 (90 Base) MCG/ACT inhaler Inhale 1-2 puffs into the lungs every 6 (six) hours as needed for wheezing. 1 Inhaler 0  . albuterol (PROVENTIL) (5 MG/ML) 0.5% nebulizer solution Take 0.5 mLs (2.5 mg total) by nebulization every 6 (six) hours as needed for wheezing or shortness of breath. 20 mL 12  . ferrous sulfate (FERROUSUL) 325 (65 FE) MG tablet Take 1 tablet (325 mg total) by mouth daily with breakfast. 30 tablet 1  . HYDROcodone-homatropine (HYCODAN) 5-1.5 MG/5ML syrup Take 5 mLs by mouth every 6 (six) hours as needed for cough. 240 mL 0  . norethindrone (AYGESTIN) 5 MG tablet Take 2 tablets (10 mg total) by mouth daily. 20 tablet 1  . omeprazole (PRILOSEC) 20 MG capsule Take 20 mg by mouth 2 (two) times daily.    . rivaroxaban (XARELTO) 20 MG TABS tablet Take 1 tablet (20 mg total) by mouth daily with supper. 30 tablet 5  . traMADol (ULTRAM) 50 MG tablet Take 1 tablet (50 mg total) by mouth every 8 (eight) hours as needed. (Patient taking differently: Take 50 mg by mouth every 8 (eight) hours as needed for moderate pain. ) 30 tablet 0   No facility-administered medications prior to visit.         Objective:   Physical Exam Vitals:   02/19/17 1021 02/19/17 1023  BP:  112/70  Pulse:  (!) 112  SpO2:  95%  Weight: (!) 312 lb (141.5 kg)   Height: 5\' 6"  (1.676 m)    Gen: Pleasant, obese woman, in no distress,  normal affect, frequent dry cough  ENT: No lesions,  mouth clear,  oropharynx clear, no postnasal drip  Neck: No JVD, no stridor, strong voice  Lungs: No use of accessory muscles, significantly decreased breath sounds on the left, otherwise clear  Cardiovascular: RRR, heart sounds normal, no murmur or gallops, no peripheral edema  Musculoskeletal: No deformities, no cyanosis or clubbing  Neuro: alert, non focal  Skin: Warm, no lesions or rashes      Assessment & Plan:  Uterine bleeding This is now resolved.  She is on maintenance dose  Xarelto 20 mg daily at this point.  Her hemoglobin had stabilized on recheck 1/8.  I do not feel like we need to recheck it again today.  Pulmonary embolism on right [facility]) This is a new diagnosis and she will need a heparin bridge in order to facilitate discontinuation of Xarelto, procedure to evaluate her left lower lobe.  Unclear if this was a provoked clot, I am concerned about possible malignancy.  Duration of her therapy will be determined by our ongoing workup.  Left lower lobe pulmonary infiltrate This is persisted and has now progressed to left lower lobe consolidation.  I am concerned about a possible endobronchial lesion.  She needs bronchoscopy and I do not want to delay.  We will admit her for heparin bridging and then bronchoscopy.  Baltazar Apo, MD, PhD 02/19/2017, 11:01 AM [facility] [phone] or if no answer [phone]

## 2017-02-22 ENCOUNTER — Encounter: Payer: Self-pay | Admitting: Obstetrics and Gynecology

## 2017-02-22 ENCOUNTER — Ambulatory Visit: Payer: 59 | Admitting: Obstetrics and Gynecology

## 2017-02-22 ENCOUNTER — Telehealth: Payer: Self-pay | Admitting: Emergency Medicine

## 2017-02-22 VITALS — BP 105/89 | HR 131 | Wt 313.1 lb

## 2017-02-22 DIAGNOSIS — N939 Abnormal uterine and vaginal bleeding, unspecified: Secondary | ICD-10-CM

## 2017-02-22 NOTE — Progress Notes (Signed)
GYNECOLOGY OFFICE VISIT NOTE  History:  37 y.o. G1P0010 here today for follow up for vaginal bleeding. She denies any abnormal vaginal discharge, bleeding, pelvic pain or other concerns. Bleeding has been well controlled on aygestin. She is to be admitted to [facility] tomorrow with plans for bronchoscopy on Wednesday, 02/24/17. She does report significantly increased pain today, particularly in right groin. She was given tramadol by PCP but reports it is not enough.   She was noted to be in tears by staff here from pain and moving around.   Past Medical History:  Diagnosis Date  . Asthma   . 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 Pneumonia   . Pulmonary embolism [facility])     Past Surgical History:  Procedure Laterality Date  . APPENDECTOMY    . BREAST SURGERY  breast reduction     Current Outpatient Medications:  .  acetaminophen (TYLENOL) 500 MG tablet, Take 1,000 mg by mouth every 6 (six) hours as needed for moderate pain., Disp: , Rfl:  .  albuterol (PROVENTIL HFA;VENTOLIN HFA) 108 (90 Base) MCG/ACT inhaler, Inhale 1-2 puffs into the lungs every 6 (six) hours as needed for wheezing., Disp: 1 Inhaler, Rfl: 0 .  albuterol (PROVENTIL) (5 MG/ML) 0.5% nebulizer solution, Take 0.5 mLs (2.5 mg total) by nebulization every 6 (six) hours as needed for wheezing or shortness of breath., Disp: 20 mL, Rfl: 12 .  ferrous sulfate (FERROUSUL) 325 (65 FE) MG tablet, Take 1 tablet (325 mg total) by mouth daily with breakfast., Disp: 30 tablet, Rfl: 1 .  HYDROcodone-homatropine (HYCODAN) 5-1.5 MG/5ML syrup, Take 5 mLs by mouth every 6 (six) hours as needed for cough., Disp: 240 mL, Rfl: 0 .  norethindrone (AYGESTIN) 5 MG tablet, Take 2 tablets (10 mg total) by mouth daily., Disp: 20 tablet, Rfl: 1 .  rivaroxaban (XARELTO) 20 MG TABS tablet, Take 1 tablet (20 mg total) by mouth daily with supper., Disp: 30 tablet, Rfl: 5 .  omeprazole (PRILOSEC) 20 MG capsule, Take 20 mg by mouth 2 (two) times  daily., Disp: , Rfl:  .  traMADol (ULTRAM) 50 MG tablet, Take 1 tablet (50 mg total) by mouth every 8 (eight) hours as needed. (Patient not taking: Reported on 02/22/2017), Disp: 30 tablet, Rfl: 0  The following portions of the patient's history were reviewed and updated as appropriate: allergies, current medications, past family history, past medical history, past social history, past surgical history and problem list.   Health Maintenance:  Last pap: 08/2014 Last mammogram: n/a  Review of Systems:  Pertinent items noted in HPI and remainder of comprehensive ROS otherwise negative.   Objective:  Physical Exam BP 105/89   Pulse (!) 131   Wt (!) 313 lb 1.6 oz (142 kg)   LMP 02/01/2017 (Approximate)   SpO2 95%   BMI 50.54 kg/m  CONSTITUTIONAL: Well-developed, well-nourished female in no mild distress.  HENT:  Normocephalic, atraumatic. External right and left ear normal. Oropharynx is clear and moist EYES: Conjunctivae and EOM are normal. Pupils are equal, round, and reactive to light. No scleral icterus.  NECK: Normal range of motion, supple, no masses SKIN: Skin is warm and dry. No rash noted. Not diaphoretic. No erythema. No pallor. NEUROLOGIC: Alert and oriented to person, place, and time. Normal reflexes, muscle tone coordination. No cranial nerve deficit noted. PSYCHIATRIC: Normal mood and affect. Normal behavior. Normal judgment and thought content. CARDIOVASCULAR: Normal heart rate noted RESPIRATORY: labored breathing ntoed ABDOMEN: Soft, no  distention noted.   PELVIC: Deferred MUSCULOSKELETAL: Normal range of motion. No edema noted. Moderately tender in right groin below inguinal canal  Labs and Imaging Dg Chest 2 View  Result Date: 02/15/2017 CLINICAL DATA:  Six months of cough. History of right-sided pulmonary embolism. History of a left-sided cavitary mass. Asthma. EXAM: CHEST  2 VIEW COMPARISON:  CT scan of the chest of January 17, 2017 and chest x-ray of the same day  FINDINGS: The right lung is adequately inflated. The interstitial markings on the right are coarse. On the left there is persistent mild volume loss with increased density in the mid and lower lung. Probable persistent left pleural effusion. The heart and pulmonary vascularity are normal. IMPRESSION: Persistent parenchymal consolidation in the mid and lower left lung with probable pleural effusion. Slight interval increase in the conspicuity of the interstitial markings of the right lung may reflect mild interstitial edema. Repeat chest CT scanning is recommended. Electronically Signed   By: David  Martinique M.D.   On: 02/15/2017 11:49   Dg Abd 1 View  Result Date: 02/06/2017 CLINICAL DATA:  Right groin pain. EXAM: ABDOMEN - 1 VIEW COMPARISON:  None. FINDINGS: The bowel gas pattern is normal. No unexpected radio-opaque calculi or other significant radiographic abnormality are seen. Phleboliths noted over the anatomic pelvis bilaterally. IMPRESSION: Negative. Electronically Signed   By: Misty Stanley M.D.   On: 02/06/2017 13:16   US Pelvic Complete With Transvaginal  Result Date: 02/06/2017 CLINICAL DATA:  Abnormal uterine bleeding.  IUD.  Fibroids. EXAM: TRANSABDOMINAL AND TRANSVAGINAL ULTRASOUND OF PELVIS TECHNIQUE: Both transabdominal and transvaginal ultrasound examinations of the pelvis were performed. Transabdominal technique was performed for global imaging of the pelvis including uterus, ovaries, adnexal regions, and pelvic cul-de-sac. It was necessary to proceed with endovaginal exam following the transabdominal exam to visualize the IUD and ovaries. COMPARISON:  None FINDINGS: Uterus Measurements: 10.5 x 6.3 x 9.9 cm. A large subserosal fibroid is seen in the left posterior corpus 7.1 cm in maximum diameter. A second fibroid is seen in the left anterior corpus measuring 2.1 cm. Endometrium Thickness: 24 mm. Heterogeneous appearance, but no focal abnormality visualized. No IUD visualized. Right ovary  Measurements: 3.4 x 1.7 x 2.5 cm. Normal appearance/no adnexal mass. Left ovary Measurements: 3.3 x 1.8 x 1.8 cm. Normal appearance/no adnexal mass. Other findings No abnormal free fluid. IMPRESSION: Two uterine fibroids, largest measuring 7.1 cm. Thickened heterogeneous endometrium measuring 24 mm. No IUD visualized. Normal appearance of both ovaries.  No adnexal mass identified. Electronically Signed   By: Earle Gell M.D.   On: 02/06/2017 12:19    Assessment & Plan:   1. Vaginal bleeding Reviewed EMB path was negative and concern lessened for intra-uterine malignancy Recommended reinsertion of IUD as this worked well for her bleeding before, patient is agreeable to this plan She will return for IUD insertion once bronchoscopy done (scheduled for 02/14/17 and PE symptoms improved as she currently could not tolerate that procedure She will call if bleeding worsens in the meantime or needs refills of aygestin Will repeat pap with IUD insertion  Routine preventative health maintenance measures emphasized. Please refer to After Visit Summary for other counseling recommendations.   Return if symptoms worsen or fail to improve, for for IUD insertion.   Feliz Beam, M.D. Attending [place], [facility] for [employer], [place]

## 2017-02-22 NOTE — Progress Notes (Signed)
Here for follow up for bleeding. Also had PE recently . Also has cough. C/o pain right groin, back, and from coughing=10. Patient teary , SOB while ambulating, no SOB when sitting.

## 2017-02-22 NOTE — Telephone Encounter (Signed)
Paperwork scanned and faxed on 02/22/17

## 2017-02-22 NOTE — Telephone Encounter (Signed)
Pt requesting recs on her blood thinners regarding bronch scheduled on Wednesday.  Pt is on Xarelto, was told that she would be started on Heparin the day before.    Spoke with RB who is in office- advised that pt is not to take Xarelto tomorrow, to arrive at [facility] outpatient admitting at 9:00.  Hospital will then manage anticoagulant switch.   Spoke with pt to make aware.  Nothing further needed.

## 2017-02-23 ENCOUNTER — Telehealth: Payer: Self-pay | Admitting: Pharmacy Technician

## 2017-02-23 ENCOUNTER — Inpatient Hospital Stay (HOSPITAL_COMMUNITY)
Admission: AD | Admit: 2017-02-23 | Discharge: 2017-02-25 | DRG: 181 | Disposition: A | Discharge status: Home / Self Care | Payer: 59 | Source: Ambulatory Visit | Attending: Emergency Medicine | Admitting: Emergency Medicine

## 2017-02-23 ENCOUNTER — Telehealth: Payer: Self-pay | Admitting: Emergency Medicine

## 2017-02-23 DIAGNOSIS — R05 Cough: Secondary | ICD-10-CM | POA: Diagnosis present

## 2017-02-23 DIAGNOSIS — C3490 Malignant neoplasm of unspecified part of unspecified bronchus or lung: Secondary | ICD-10-CM | POA: Diagnosis not present

## 2017-02-23 DIAGNOSIS — Z6841 Body Mass Index (BMI) 40.0 and over, adult: Secondary | ICD-10-CM

## 2017-02-23 DIAGNOSIS — J189 Pneumonia, unspecified organism: Secondary | ICD-10-CM | POA: Diagnosis not present

## 2017-02-23 DIAGNOSIS — J9601 Acute respiratory failure with hypoxia: Secondary | ICD-10-CM | POA: Diagnosis not present

## 2017-02-23 DIAGNOSIS — A419 Sepsis, unspecified organism: Secondary | ICD-10-CM | POA: Diagnosis not present

## 2017-02-23 DIAGNOSIS — I2699 Other pulmonary embolism without acute cor pulmonale: Secondary | ICD-10-CM | POA: Diagnosis present

## 2017-02-23 DIAGNOSIS — Z7901 Long term (current) use of anticoagulants: Secondary | ICD-10-CM | POA: Diagnosis not present

## 2017-02-23 DIAGNOSIS — Z803 Family history of malignant neoplasm of breast: Secondary | ICD-10-CM | POA: Diagnosis not present

## 2017-02-23 DIAGNOSIS — J9811 Atelectasis: Secondary | ICD-10-CM | POA: Diagnosis not present

## 2017-02-23 DIAGNOSIS — Z86711 Personal history of pulmonary embolism: Secondary | ICD-10-CM | POA: Diagnosis not present

## 2017-02-23 DIAGNOSIS — J90 Pleural effusion, not elsewhere classified: Secondary | ICD-10-CM | POA: Diagnosis not present

## 2017-02-23 DIAGNOSIS — J9600 Acute respiratory failure, unspecified whether with hypoxia or hypercapnia: Secondary | ICD-10-CM | POA: Diagnosis not present

## 2017-02-23 DIAGNOSIS — C7801 Secondary malignant neoplasm of right lung: Secondary | ICD-10-CM | POA: Diagnosis not present

## 2017-02-23 DIAGNOSIS — Z79890 Hormone replacement therapy (postmenopausal): Secondary | ICD-10-CM | POA: Diagnosis not present

## 2017-02-23 DIAGNOSIS — Z801 Family history of malignant neoplasm of trachea, bronchus and lung: Secondary | ICD-10-CM | POA: Diagnosis not present

## 2017-02-23 DIAGNOSIS — J45909 Unspecified asthma, uncomplicated: Secondary | ICD-10-CM | POA: Diagnosis present

## 2017-02-23 DIAGNOSIS — Z800 Family history of malignant neoplasm of digestive organs: Secondary | ICD-10-CM

## 2017-02-23 DIAGNOSIS — C7951 Secondary malignant neoplasm of bone: Secondary | ICD-10-CM | POA: Diagnosis not present

## 2017-02-23 DIAGNOSIS — R918 Other nonspecific abnormal finding of lung field: Secondary | ICD-10-CM

## 2017-02-23 DIAGNOSIS — J9602 Acute respiratory failure with hypercapnia: Secondary | ICD-10-CM | POA: Diagnosis not present

## 2017-02-23 DIAGNOSIS — K219 Gastro-esophageal reflux disease without esophagitis: Secondary | ICD-10-CM | POA: Diagnosis present

## 2017-02-23 DIAGNOSIS — C3492 Malignant neoplasm of unspecified part of left bronchus or lung: Secondary | ICD-10-CM | POA: Diagnosis present

## 2017-02-23 DIAGNOSIS — R0600 Dyspnea, unspecified: Secondary | ICD-10-CM | POA: Diagnosis present

## 2017-02-23 DIAGNOSIS — R0602 Shortness of breath: Secondary | ICD-10-CM | POA: Diagnosis not present

## 2017-02-23 DIAGNOSIS — T17908S Unspecified foreign body in respiratory tract, part unspecified causing other injury, sequela: Secondary | ICD-10-CM | POA: Diagnosis not present

## 2017-02-23 DIAGNOSIS — C3432 Malignant neoplasm of lower lobe, left bronchus or lung: Secondary | ICD-10-CM | POA: Diagnosis not present

## 2017-02-23 LAB — CBC
HCT: 33.9 % — ABNORMAL LOW (ref 36.0–46.0)
HEMOGLOBIN: 10.7 g/dL — AB (ref 12.0–15.0)
MCH: 28.6 pg (ref 26.0–34.0)
MCHC: 31.6 g/dL (ref 30.0–36.0)
MCV: 90.6 fL (ref 78.0–100.0)
PLATELETS: 209 10*3/uL (ref 150–400)
RBC: 3.74 MIL/uL — ABNORMAL LOW (ref 3.87–5.11)
RDW: 15.9 % — AB (ref 11.5–15.5)
WBC: 15.5 10*3/uL — ABNORMAL HIGH (ref 4.0–10.5)

## 2017-02-23 LAB — PROTIME-INR
INR: 1.46
PROTHROMBIN TIME: 17.6 s — AB (ref 11.4–15.2)

## 2017-02-23 LAB — BASIC METABOLIC PANEL
ANION GAP: 9 (ref 5–15)
BUN: 11 mg/dL (ref 6–20)
CHLORIDE: 108 mmol/L (ref 101–111)
CO2: 23 mmol/L (ref 22–32)
Calcium: 9.2 mg/dL (ref 8.9–10.3)
Creatinine, Ser: 0.66 mg/dL (ref 0.44–1.00)
Glucose, Bld: 97 mg/dL (ref 65–99)
POTASSIUM: 3.7 mmol/L (ref 3.5–5.1)
SODIUM: 140 mmol/L (ref 135–145)

## 2017-02-23 LAB — HEPARIN LEVEL (UNFRACTIONATED): Heparin Unfractionated: 1.18 IU/mL — ABNORMAL HIGH (ref 0.30–0.70)

## 2017-02-23 LAB — APTT: APTT: 32 s (ref 24–36)

## 2017-02-23 LAB — PHOSPHORUS: PHOSPHORUS: 3.4 mg/dL (ref 2.5–4.6)

## 2017-02-23 LAB — MAGNESIUM: MAGNESIUM: 2.2 mg/dL (ref 1.7–2.4)

## 2017-02-23 MED ORDER — TRAMADOL HCL 50 MG PO TABS
100.0000 mg | ORAL_TABLET | Freq: Four times a day (QID) | ORAL | Status: DC | PRN
  Administered 2017-02-23 – 2017-02-25 (×7): 100 mg via ORAL
  Filled 2017-02-23 (×7): qty 2

## 2017-02-23 MED ORDER — HYDROCODONE-HOMATROPINE 5-1.5 MG/5ML PO SYRP
5.0000 mL | ORAL_SOLUTION | Freq: Four times a day (QID) | ORAL | Status: DC | PRN
  Administered 2017-02-23: 5 mL via ORAL
  Filled 2017-02-23: qty 5

## 2017-02-23 MED ORDER — NORETHINDRONE ACETATE 5 MG PO TABS
10.0000 mg | ORAL_TABLET | Freq: Every day | ORAL | Status: DC
  Administered 2017-02-24 – 2017-02-25 (×2): 10 mg via ORAL
  Filled 2017-02-23 (×2): qty 2

## 2017-02-23 MED ORDER — HEPARIN (PORCINE) IN NACL 100-0.45 UNIT/ML-% IJ SOLN
1850.0000 [IU]/h | INTRAMUSCULAR | Status: AC
Start: 2017-02-23 — End: 2017-02-24
  Administered 2017-02-23: 1700 [IU]/h via INTRAVENOUS
  Filled 2017-02-23: qty 250

## 2017-02-23 MED ORDER — SODIUM CHLORIDE 0.9 % IV SOLN: 250.0000 mL | INTRAVENOUS | Status: DC | PRN

## 2017-02-23 MED ORDER — PANTOPRAZOLE SODIUM 40 MG PO TBEC
40.0000 mg | DELAYED_RELEASE_TABLET | Freq: Every day | ORAL | Status: DC
  Administered 2017-02-23 – 2017-02-25 (×3): 40 mg via ORAL
  Filled 2017-02-23 (×3): qty 1

## 2017-02-23 MED ORDER — SODIUM CHLORIDE 0.9% FLUSH
3.0000 mL | Freq: Two times a day (BID) | INTRAVENOUS | Status: DC
  Administered 2017-02-23 – 2017-02-25 (×2): 3 mL via INTRAVENOUS

## 2017-02-23 MED ORDER — ACETAMINOPHEN 325 MG PO TABS: 650.0000 mg | ORAL_TABLET | Freq: Four times a day (QID) | ORAL | Status: DC | PRN

## 2017-02-23 MED ORDER — SODIUM CHLORIDE 0.9% FLUSH: 3.0000 mL | INTRAVENOUS | Status: DC | PRN

## 2017-02-23 NOTE — Progress Notes (Signed)
ANTICOAGULATION CONSULT NOTE - Initial Consult  Pharmacy Consult for heparin Indication: pulmonary embolus - bridge while off rivaroxaban  No Known Allergies  Patient Measurements:   Heparin Dosing Weight: 94.4kg  Vital Signs: Temp: 99.1 F (37.3 C) (01/15 1744) Temp Source: Oral (01/15 1744) BP: 138/73 (01/15 1744) Pulse Rate: 123 (01/15 1744)  Labs: No results for input(s): HGB, HCT, PLT, APTT, LABPROT, INR, HEPARINUNFRC, HEPRLOWMOCWT, CREATININE, CKTOTAL, CKMB, TROPONINI in the last 72 hours.  Estimated Creatinine Clearance: 139.1 mL/min (by C-G formula based on SCr of 0.63 mg/dL).   Medical History: Past Medical History:  Diagnosis Date  . Asthma   . 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 Pneumonia   . Pulmonary embolism ([place])     Assessment: 33 YOF presents for bronchoscopy due to persistent dyspnea and abnormal CXR.  She is on rivaroxaban for for h/o PE.  Pharmacy asked to dose heparin gtt  Last dose rivaroxaban 1/14 at 10am  Today, 02/23/2017  Baseline aPTT 32sec, INR = 1.42, HL = 1.18 (elevated baseline INR and HL c/w DOAC use)  Goal of Therapy:  Heparin level 0.3-0.7 units/ml aPTT 66-102 seconds Monitor platelets by anticoagulation protocol: Yes   Plan:   NO bolus, start heparin 1700 units/hr after baseline labs drawn  Orders to turn off heparin at 0800 1/16 for bronchoscopy  Check 6h aPTT - use aPTT for now to monitor heparin due to recent anti-Xa inhibitor use  Daily Heparin level and CBC  Clovis Riley 02/23/2017,6:14 PM

## 2017-02-23 NOTE — H&P (Signed)
Name: Crystal Gallagher MRN: [id number] DOB: February 13, 1980    ADMISSION DATE:  02/23/2017  CHIEF COMPLAINT:  LLL collapse  BRIEF PATIENT DESCRIPTION: 37 year old never smoker well known to me from the office.  She has been treated on multiple occasions with antibiotics for presumed left lower lobe pneumonia.  Unfortunately her symptoms of dyspnea and her abnormal chest x-ray persisted.  We plan to perform bronchoscopy but this was deferred due to inclement weather.  She then underwent further evaluation with a CT pulmonary angiogram 01/17/17 that showed pulmonary embolism predominantly on the right.  She was started on anticoagulation and transition to Xarelto.  Her anticoagulation course was complicated by some uterine bleeding that has now stopped.  A review of her CT scan of the chest from 01/17/17 shows that her left lower lobe infiltrate is continued to progress and now looks like lobar consolidation with an apparent cutoff sign.  I am concerned there is an endobronchial lesion and possibly malignancy present.  She is admitted now for heparin bridging to facilitate bronchoscopy and inspection of her left lower lobe airways.  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  has a past surgical history that includes Appendectomy and Breast surgery (breast reduction).   Prior to Admission medications   Medication Sig Start Date End Date Taking? Authorizing Provider  acetaminophen (TYLENOL) 500 MG tablet Take 1,000 mg by mouth every 6 (six) hours as needed for moderate pain.    [provider]  albuterol (PROVENTIL HFA;VENTOLIN HFA) 108 (90 Base) MCG/ACT inhaler Inhale 1-2 puffs into the lungs every 6 (six) hours as needed for wheezing. 10/30/16   Tanna Furry, MD  albuterol (PROVENTIL) (5 MG/ML) 0.5% nebulizer solution Take 0.5 mLs (2.5 mg total) by nebulization every 6 (six) hours as needed for  wheezing or shortness of breath. 11/10/16   Hedges, Dellis Filbert, PA-C  ferrous sulfate (FERROUSUL) 325 (65 FE) MG tablet Take 1 tablet (325 mg total) by mouth daily with breakfast. 02/06/17   Leftwich-Kirby, Kathie Dike, CNM  HYDROcodone-homatropine (HYCODAN) 5-1.5 MG/5ML syrup Take 5 mLs by mouth every 6 (six) hours as needed for cough. 02/15/17   Magdalen Spatz, NP  norethindrone (AYGESTIN) 5 MG tablet Take 2 tablets (10 mg total) by mouth daily. 02/16/17   Sloan Leiter, MD  omeprazole (PRILOSEC) 20 MG capsule Take 20 mg by mouth 2 (two) times daily.    [provider]  rivaroxaban (XARELTO) 20 MG TABS tablet Take 1 tablet (20 mg total) by mouth daily with supper. 02/15/17   Magdalen Spatz, NP  traMADol (ULTRAM) 50 MG tablet Take 1 tablet (50 mg total) by mouth every 8 (eight) hours as needed. Patient not taking: Reported on 02/22/2017 02/11/17   Forrest Moron, MD   No Known Allergies  FAMILY HISTORY:  family history includes Alzheimer's disease in her paternal grandmother; Arthritis in her mother; Breast cancer in her maternal aunt and maternal aunt; Cancer in her maternal grandfather and other; Colon cancer in her father and paternal uncle; Hypertension in her maternal grandfather and other; Stroke in her maternal grandmother. SOCIAL HISTORY:  reports that  has never smoked. she has never used smokeless tobacco. She reports that she drinks alcohol. She reports that she does not use drugs.  REVIEW OF SYSTEMS:   Constitutional: Negative for fever, chills, weight loss, malaise/fatigue and diaphoresis.  HENT: Negative for hearing loss, ear  pain, nosebleeds, congestion, sore throat, neck pain, tinnitus and ear discharge.   Eyes: Negative for blurred vision, double vision, photophobia, pain, discharge and redness.  Respiratory: Negative for cough, hemoptysis, sputum production, shortness of breath, wheezing and stridor.   Cardiovascular: Negative for chest pain, palpitations, orthopnea, claudication,  leg swelling and PND.  Gastrointestinal: Negative for heartburn, nausea, vomiting, abdominal pain, diarrhea, constipation, blood in stool and melena.  Genitourinary: Negative for dysuria, urgency, frequency, hematuria and flank pain.  Musculoskeletal: Negative for myalgias, back pain, joint pain and falls.  Skin: Negative for itching and rash.  Neurological: Negative for dizziness, tingling, tremors, sensory change, speech change, focal weakness, seizures, loss of consciousness, weakness and headaches.  Endo/Heme/Allergies: Negative for environmental allergies and polydipsia. Does not bruise/bleed easily.  SUBJECTIVE:  Patient continues to have dyspnea, hoarse voice, cough  VITAL SIGNS:   PHYSICAL EXAMINATION: General:  Pleasant obese woman, up to chair Neuro:  Alert, oriented, non-focal HEENT:  Hoarse voice, Op clear, no erythema Cardiovascular:  Regular, distant, no M Lungs:  Decreased at both bases, no wheeze or crackles Abdomen:  Obese, benign Musculoskeletal:  No deformities, no edema Skin:  No rash  No results for input(s): NA, K, CL, CO2, BUN, CREATININE, GLUCOSE in the last 168 hours. No results for input(s): HGB, HCT, WBC, PLT in the last 168 hours. No results found.  ASSESSMENT / PLAN:  Left lower lobe collapse, etiology unclear, concerning for possible endobronchial lesion -Plan for bronchoscopic inspection.  She is currently scheduled for 1/16 in the afternoon.  Pulmonary embolism, on Xarelto -She is admitted for heparin bridging.  Last took her Xarelto on 1/14 approximately 10:30 AM. On hold  GERD - continue PPI   Baltazar Apo, MD, PhD 02/23/2017, 5:23 PM [facility] [phone] or if no answer [phone]

## 2017-02-23 NOTE — Telephone Encounter (Signed)
Spoke with pt. She is aware that she is not to take her Xarelto today. Nothing further was needed.

## 2017-02-24 ENCOUNTER — Ambulatory Visit (HOSPITAL_COMMUNITY): Admission: RE | Admit: 2017-02-24 | Payer: 59 | Source: Ambulatory Visit | Admitting: Emergency Medicine

## 2017-02-24 ENCOUNTER — Encounter (HOSPITAL_COMMUNITY)
Admission: AD | Disposition: A | Discharge status: Home / Self Care | Payer: Self-pay | Source: Ambulatory Visit | Attending: Emergency Medicine

## 2017-02-24 ENCOUNTER — Inpatient Hospital Stay (HOSPITAL_COMMUNITY)
Admission: RE | Admit: 2017-02-24 | Discharge: 2017-02-24 | Disposition: A | Discharge status: Home / Self Care | Payer: 59 | Source: Ambulatory Visit

## 2017-02-24 HISTORY — PX: VIDEO BRONCHOSCOPY: SHX5072

## 2017-02-24 LAB — APTT: APTT: 40 s — AB (ref 24–36)

## 2017-02-24 SURGERY — BRONCHOSCOPY, WITH FLUOROSCOPY
Anesthesia: Moderate Sedation | Laterality: Bilateral

## 2017-02-24 MED ORDER — SODIUM CHLORIDE 0.9 % IV SOLN
Freq: Once | INTRAVENOUS | Status: AC
  Administered 2017-02-24: 12:00:00 via INTRAVENOUS

## 2017-02-24 MED ORDER — PHENYLEPHRINE HCL 0.25 % NA SOLN
NASAL | Status: DC | PRN
  Administered 2017-02-24: 2 via NASAL

## 2017-02-24 MED ORDER — MIDAZOLAM HCL 5 MG/ML IJ SOLN
INTRAMUSCULAR | Status: AC
  Filled 2017-02-24: qty 2

## 2017-02-24 MED ORDER — LIDOCAINE HCL 1 % IJ SOLN
INTRAMUSCULAR | Status: DC | PRN
  Administered 2017-02-24: 6 mL via RESPIRATORY_TRACT

## 2017-02-24 MED ORDER — MIDAZOLAM HCL 10 MG/2ML IJ SOLN
INTRAMUSCULAR | Status: DC | PRN
  Administered 2017-02-24: 1 mg via INTRAVENOUS
  Administered 2017-02-24: 3 mg via INTRAVENOUS
  Administered 2017-02-24: 1 mg via INTRAVENOUS

## 2017-02-24 MED ORDER — LIDOCAINE HCL 2 % EX GEL
CUTANEOUS | Status: DC | PRN
  Administered 2017-02-24: 1

## 2017-02-24 MED ORDER — FENTANYL CITRATE (PF) 100 MCG/2ML IJ SOLN
INTRAMUSCULAR | Status: DC | PRN
  Administered 2017-02-24: 50 ug via INTRAVENOUS
  Administered 2017-02-24: 25 ug via INTRAVENOUS
  Administered 2017-02-24: 50 ug via INTRAVENOUS
  Administered 2017-02-24 (×3): 25 ug via INTRAVENOUS

## 2017-02-24 MED ORDER — HEPARIN (PORCINE) IN NACL 100-0.45 UNIT/ML-% IJ SOLN
1950.0000 [IU]/h | INTRAMUSCULAR | Status: DC
  Administered 2017-02-24: 1850 [IU]/h via INTRAVENOUS
  Administered 2017-02-25: 1950 [IU]/h via INTRAVENOUS
  Filled 2017-02-24 (×3): qty 250

## 2017-02-24 MED ORDER — HEPARIN (PORCINE) IN NACL 100-0.45 UNIT/ML-% IJ SOLN: 1850.0000 [IU]/h | INTRAMUSCULAR | Status: DC

## 2017-02-24 MED ORDER — FENTANYL CITRATE (PF) 100 MCG/2ML IJ SOLN
INTRAMUSCULAR | Status: AC
  Filled 2017-02-24: qty 4

## 2017-02-24 NOTE — Op Note (Signed)
[facility] Cardiopulmonary Patient Name: Crystal Gallagher Procedure Date: 02/24/2017 MRN: [id number] Attending MD: Collene Gobble , MD Date of Birth: 1980/11/30 CSN: [id number] Age: 37 Admit Type: Inpatient Ethnicity: Not Hispanic or Latino Procedure:            Bronchoscopy Indications:          Atelectasis of the left lower lobe, Unresolving left                        lower lobe infiltrate Providers:            Collene Gobble, MD, Cherre Huger RRT, RCP, Phillis Knack                        RRT, RCP Referring MD:          Medicines:            Midazolam 5 mg IV, Fentanyl 200 mcg IV, Lidocaine 1%                        applied to cords 12 mL, Lidocaine 1% applied to the                        tracheobronchial tree 16 mL Complications:        No immediate complications Estimated Blood Loss: Estimated blood loss was minimal. Good hemostasis at                        the end of the case Procedure:      Pre-Anesthesia Assessment:      - A History and Physical has been performed. Patient meds and allergies       have been reviewed. The risks and benefits of the procedure and the       sedation options and risks were discussed with the patient. All       questions were answered and informed consent was obtained. Patient       identification and proposed procedure were verified prior to the       procedure by the physician in the procedure room. Mental Status       Examination: normal. Airway Examination: normal oropharyngeal airway.       Respiratory Examination: poor air movement. CV Examination: RRR, no       murmurs, no S3 or S4. ASA Grade Assessment: II - A patient with mild       systemic disease. After reviewing the risks and benefits, the patient       was deemed in satisfactory condition to undergo the procedure. The       anesthesia plan was to use moderate sedation / analgesia (conscious       sedation). Immediately prior to administration of medications, the   patient was re-assessed for adequacy to receive sedatives. The heart       rate, respiratory rate, oxygen saturations, blood pressure, adequacy of       pulmonary ventilation, and response to care were monitored throughout       the procedure. The physical status of the patient was re-assessed after       the procedure.      After obtaining informed consent, the bronchoscope was passed under       direct vision. Throughout the procedure, the patient's blood pressure,  pulse, and oxygen saturations were monitored continuously. the [id number]       [id number] scope was introduced through the right nostril and advanced to       the tracheobronchial tree. The procedure was extremely difficult due to       the patient's coughing. The total duration of the procedure was 29       minutes. Findings:      The nasopharynx/oropharynx appears normal. The larynx appears normal.       The vocal cords appear normal. The subglottic space is normal. The       trachea is of normal caliber. The carina is sharp. The tracheobronchial       tree of the right lung was examined to at least the first subsegmental       level. Bronchial mucosa and anatomy in the right lung are normal; there       are no endobronchial lesions, and no secretions.      Left Lung Abnormalities: Erythema was found in the left lower lobe       bronchus. Edema was found in the left lower lobe bronchus. A       non-obstructing congested (edematous), erythematous (hyperemic), friable       (with contact bleeding), submucosal and raised lesion was found       proximally, at the LUL carina and into the orifice in the left lower       lobe, narrowing but not obstructing the airway. Endobronchial biopsies       were performed at the left upper lobe carina using forceps and sent for       histopathology examination. Three samples were obtained. Brushings were       obtained at the left upper lobe carina and sent for routine cytology.       Two  samples were obtained. Impression:      - Atelectasis of the left lower lobe      - Unresolving left lower lobe infiltrate      - The right lung was normal.      - Erythema was present in the left lower lobe.      - Edema was present in the left lower lobe.      - A lesion was found in the left lower lobe and on the LUL carina.      - An endobronchial biopsy was performed LUL carina.      - Brushings were obtained from LUL carina and the LLL airway. Moderate Sedation:      Moderate (conscious) sedation was personally administered by the       endoscopist. The following parameters were monitored: oxygen saturation,       heart rate, blood pressure, respiratory rate, EKG, adequacy of pulmonary       ventilation, and response to care. Total physician intraservice time was       29 minutes. Recommendation:      - Await biopsy and brushing results.      - We will restart heparin at 20:00, plan to restart xarelto on 1/17 if       no evidence significant bleeding. Procedure Code(s):      --- Professional ---      [phone], Bronchoscopy, rigid or flexible, including fluoroscopic guidance,       when performed; with bronchial or endobronchial biopsy(s), single or       multiple sites      67124, Bronchoscopy, rigid or flexible, including fluoroscopic  guidance,       when performed; with brushing or protected brushings      99152, Moderate sedation services provided by the same physician or       other qualified health care professional performing the diagnostic or       therapeutic service that the sedation supports, requiring the presence       of an independent trained observer to assist in the monitoring of the       patient's level of consciousness and physiological status; initial 15       minutes of intraservice time, patient age 50 years or older      [phone], Moderate sedation services; each additional 15 minutes       intraservice time Diagnosis Code(s):      --- Professional ---       J98.11, Atelectasis      R91.8, Other nonspecific abnormal finding of lung field CPT copyright 2016 American Medical Association. All rights reserved. The codes documented in this report are preliminary and upon coder review may  be revised to meet current compliance requirements. Collene Gobble, MD Collene Gobble, MD 02/24/2017 2:07:07 PM Number of Addenda: 0 Scope In: 1:27:44 PM Scope Out: 2:62:03 PM

## 2017-02-24 NOTE — Interval H&P Note (Signed)
PCCM Interval Note  Pt presents now for FOB and airway inspection. Her heparin has been off since 8:00. She is NPO.  No questions. Risks and benefits discussed.   Plan bronchoscopy, evaluation LLL collapse.   Baltazar Apo, MD, PhD 02/24/2017, 1:21 PM [facility] [phone] or if no answer [phone]

## 2017-02-24 NOTE — Progress Notes (Signed)
Video bronchoscopy performed.  Intervention bronchial brushing Intervention bronchial biopsy.  No complications noted.  Will continue to monitor.

## 2017-02-24 NOTE — Progress Notes (Signed)
ANTICOAGULATION CONSULT NOTE - Follow Up Consult  Pharmacy Consult for Heparin Indication: pulmonary embolus - bridge while off rivaroxaban  No Known Allergies  Patient Measurements:   Heparin Dosing Weight:   Vital Signs: Temp: 98.6 F (37 C) (01/16 0506) Temp Source: Oral (01/16 0506) BP: 133/93 (01/16 0506) Pulse Rate: 101 (01/16 0506)  Labs: Recent Labs    02/23/17 1731 02/24/17 0600  HGB 10.7*  --   HCT 33.9*  --   PLT 209  --   APTT 32 40*  LABPROT 17.6*  --   INR 1.46  --   HEPARINUNFRC 1.18*  --   CREATININE 0.66  --     Estimated Creatinine Clearance: 139.1 mL/min (by C-G formula based on SCr of 0.66 mg/dL).   Medications:  Infusions:  . sodium chloride    . heparin 1,700 Units/hr (02/23/17 1845)    Assessment: Patient with low PTT.  No heparin issues per RN. Heparin to be turned off at 0800 per prior orders.  Goal of Therapy:  Heparin level 0.3-0.7 units/ml aPTT 66-102 seconds Monitor platelets by anticoagulation protocol: Yes   Plan:  Increase heparin to 1850 units/hr Follow up restart heparin  Tyler Deis, Shea Stakes [place] 02/24/2017,7:11 AM

## 2017-02-24 NOTE — Progress Notes (Signed)
[place] for heparin Indication: hx pulmonary embolus - bridge while off xarelto  No Known Allergies  Patient Measurements: Height: 5\' 6"  (167.6 cm) Weight: (!) 313 lb (142 kg) IBW/kg (Calculated) : 59.3 Heparin Dosing Weight: 94 kg  Vital Signs: Temp: 98.7 F (37.1 C) (01/16 1219) Temp Source: Oral (01/16 1219) BP: 129/51 (01/16 1430) Pulse Rate: 122 (01/16 1219)  Labs: Recent Labs    02/23/17 1731 02/24/17 0600  HGB 10.7*  --   HCT 33.9*  --   PLT 209  --   APTT 32 40*  LABPROT 17.6*  --   INR 1.46  --   HEPARINUNFRC 1.18*  --   CREATININE 0.66  --     Estimated Creatinine Clearance: 139.1 mL/min (by C-G formula based on SCr of 0.66 mg/dL).   Medications:  xarelto 20 mg daily PTA (Last dose on 02/22/17)  Assessment: Patient is a 37 y.o, F with hx PE on xarelto PTA, presented to [facility] on [facility]17 for bronchoscopy due to persistent dyspnea and abnormal CXR.  Patient was transitioned to heparin on admission in anticipation for procedure. She underwent bronchoscopy, brushings and endobronchial biopsy on 1/16 with heparin drip stopped at 0800 for procedure.  Per pulmonary, ok to resume heparin drip back at 8PM on 1/16.  Today, 02/24/2017: - aPTT was subtherapeutic this morning (at 6AM) at 40 sec with heparin drip running at 1700 units/hr. Rate was subsequently increased to 1850 units/hr and turned off at 8AM bronchoscopy - cbc relatively stable   Goal of Therapy:  Heparin level 0.3-0.7 units/ml aPTT 66-102 seconds Monitor platelets by anticoagulation protocol: Yes   Plan:  - resume heparin drip at 1850 units/hr (no bolus) - check 6 hr heparin level and aPTT. Will adjust based on aPTT level until both heparin and aPTT levels correlate. - monitor for s/s bleeding  Zyanne Schumm P 02/24/2017,5:02 PM

## 2017-02-24 NOTE — Care Management Note (Signed)
Case Management Note  Patient Details  Name: Crystal Gallagher MRN: [id number] Date of Birth: 06-28-1980  Subjective/Objective:                  Left lower lobe pna or mass  Action/Plan: Date:  February 24, 2017 Chart reviewed for concurrent status and case management needs.  Will continue to follow patient progress.  Discharge Planning: following for needs.  None present at this time of review. Expected discharge date: February 27 2017 Velva Harman, BSN, [place], [employer]   Expected Discharge Date:                  Expected Discharge Plan:  Home/Self Care  In-House Referral:     Discharge planning Services  CM Consult  Post Acute Care Choice:    Choice offered to:     DME Arranged:    DME Agency:     HH Arranged:    HH Agency:     Status of Service:  In process, will continue to follow  If discussed at Long Length of Stay Meetings, dates discussed:    Additional Comments:  Leeroy Cha, RN 02/24/2017, 9:24 AM

## 2017-02-25 ENCOUNTER — Encounter (HOSPITAL_COMMUNITY): Payer: Self-pay | Admitting: Emergency Medicine

## 2017-02-25 ENCOUNTER — Ambulatory Visit: Payer: 59 | Admitting: Family Medicine

## 2017-02-25 LAB — CBC
HEMATOCRIT: 32.1 % — AB (ref 36.0–46.0)
Hemoglobin: 10.2 g/dL — ABNORMAL LOW (ref 12.0–15.0)
MCH: 28.9 pg (ref 26.0–34.0)
MCHC: 31.8 g/dL (ref 30.0–36.0)
MCV: 90.9 fL (ref 78.0–100.0)
PLATELETS: 211 10*3/uL (ref 150–400)
RBC: 3.53 MIL/uL — ABNORMAL LOW (ref 3.87–5.11)
RDW: 15.8 % — AB (ref 11.5–15.5)
WBC: 13.5 10*3/uL — ABNORMAL HIGH (ref 4.0–10.5)

## 2017-02-25 LAB — HEPARIN LEVEL (UNFRACTIONATED)
Heparin Unfractionated: 0.55 IU/mL (ref 0.30–0.70)
Heparin Unfractionated: 0.56 IU/mL (ref 0.30–0.70)

## 2017-02-25 LAB — APTT
APTT: 52 s — AB (ref 24–36)
aPTT: 51 seconds — ABNORMAL HIGH (ref 24–36)

## 2017-02-25 MED ORDER — RIVAROXABAN 20 MG PO TABS
20.0000 mg | ORAL_TABLET | Freq: Every day | ORAL | Status: DC
  Administered 2017-02-25: 20 mg via ORAL
  Filled 2017-02-25: qty 1

## 2017-02-25 MED ORDER — HYDROCODONE-HOMATROPINE 5-1.5 MG/5ML PO SYRP: 5.0000 mL | ORAL_SOLUTION | Freq: Four times a day (QID) | ORAL | 0 refills | Status: AC | PRN

## 2017-02-25 MED ORDER — ACETAMINOPHEN 500 MG PO TABS: 500.0000 mg | ORAL_TABLET | Freq: Four times a day (QID) | ORAL | 0 refills | Status: AC | PRN

## 2017-02-25 MED ORDER — RIVAROXABAN 20 MG PO TABS: 20.0000 mg | ORAL_TABLET | Freq: Every day | ORAL | 1 refills | Status: AC

## 2017-02-25 NOTE — Progress Notes (Deleted)
No chief complaint on file.   HPI  4 review of systems  Past Medical History:  Diagnosis Date  . Asthma   . 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 Pneumonia   . Pulmonary embolism ([facility])     No current facility-administered medications for this visit.    No current outpatient medications on file.   Facility-Administered Medications Ordered in Other Visits  Medication Dose Route Frequency Provider Last Rate Last Dose  . 0.9 %  sodium chloride infusion  250 mL Intravenous PRN Collene Gobble, MD      . acetaminophen (TYLENOL) tablet 650 mg  650 mg Oral Q6H PRN Collene Gobble, MD      . heparin ADULT infusion 100 units/mL (25000 units/237mL sodium chloride 0.45%)  1,950 Units/hr Intravenous Continuous Collene Gobble, MD 19.5 mL/hr at 02/25/17 0326 1,950 Units/hr at 02/25/17 0326  . HYDROcodone-homatropine (HYCODAN) 5-1.5 MG/5ML syrup 5 mL  5 mL Oral Q6H PRN Collene Gobble, MD   5 mL at 02/23/17 1746  . norethindrone (AYGESTIN) tablet 10 mg  10 mg Oral Daily Collene Gobble, MD   10 mg at 02/24/17 0920  . pantoprazole (PROTONIX) EC tablet 40 mg  40 mg Oral Daily Collene Gobble, MD   40 mg at 02/24/17 0919  . sodium chloride flush (NS) 0.9 % injection 3 mL  3 mL Intravenous Q12H Collene Gobble, MD   3 mL at 02/23/17 2200  . sodium chloride flush (NS) 0.9 % injection 3 mL  3 mL Intravenous PRN Collene Gobble, MD      . traMADol Veatrice Bourbon) tablet 100 mg  100 mg Oral Q6H PRN Collene Gobble, MD   100 mg at 02/25/17 0327    Allergies: No Known Allergies  Past Surgical History:  Procedure Laterality Date  . APPENDECTOMY    . BREAST SURGERY  breast reduction    Social History   Socioeconomic History  . Marital status: Single    Spouse name: Not on file  . Number of children: Not on file  . Years of education: Not on file  . Highest education level: Not on file  Social Needs  . Financial resource strain: Not on file  . Food insecurity - worry: Not on file  . Food  insecurity - inability: Not on file  . Transportation needs - medical: Not on file  . Transportation needs - non-medical: Not on file  Occupational History  . Not on file  Tobacco Use  . Smoking status: Never Smoker  . Smokeless tobacco: Never Used  Substance and Sexual Activity  . Alcohol use: Yes    Comment: occasionally  . Drug use: No  . Sexual activity: Yes    Birth control/protection: Pill  Other Topics Concern  . Not on file  Social History Narrative  . Not on file    Family History  Problem Relation Age of Onset  . Cancer Other   . Hypertension Other   . Arthritis Mother   . Colon cancer Father   . Breast cancer Maternal Aunt   . Colon cancer Paternal Uncle   . Stroke Maternal Grandmother   . Cancer Maternal Grandfather   . Hypertension Maternal Grandfather   . Alzheimer's disease Paternal Grandmother   . Breast cancer Maternal Aunt      ROS Review of Systems See HPI Constitution: No fevers or chills No malaise No diaphoresis Skin: No rash or itching Eyes: no blurry  vision, no double vision GU: no dysuria or hematuria Neuro: no dizziness or headaches * all others reviewed and negative   Objective: There were no vitals filed for this visit.  Physical Exam  Assessment and Plan There are no diagnoses linked to this encounter.   Delores P [employer]

## 2017-02-25 NOTE — Progress Notes (Signed)
ANTICOAGULATION CONSULT NOTE - Follow Up Consult  Pharmacy Consult for Heparin Indication: hx pulmonary embolus - bridge while off xarelto  No Known Allergies  Patient Measurements: Height: 5\' 6"  (167.6 cm) Weight: (!) 313 lb (142 kg) IBW/kg (Calculated) : 59.3 Heparin Dosing Weight:   Vital Signs: Temp: 98.9 F (37.2 C) (01/16 2147) Temp Source: Oral (01/16 2147) BP: 139/85 (01/16 2147) Pulse Rate: 120 (01/16 2147)  Labs: Recent Labs    02/23/17 1731 02/24/17 0600 02/25/17 0148  HGB 10.7*  --  10.2*  HCT 33.9*  --  32.1*  PLT 209  --  211  APTT 32 40* 52*  LABPROT 17.6*  --   --   INR 1.46  --   --   HEPARINUNFRC 1.18*  --  0.55  CREATININE 0.66  --   --     Estimated Creatinine Clearance: 139.1 mL/min (by C-G formula based on SCr of 0.66 mg/dL).   Medications:  Infusions:  . sodium chloride    . heparin 1,850 Units/hr (02/24/17 2006)    Assessment: Patient with low PTT but heparin level at goal.  PTT ordered with Heparin level until both correlate due to possible drug-lab interaction between oral anticoagulant (rivaroxaban, edoxaban, or apixaban) and anti-Xa level (aka heparin level) No heparin issues per RN.  Goal of Therapy:  Heparin level 0.3-0.7 units/ml aPTT 66-102 seconds Monitor platelets by anticoagulation protocol: Yes   Plan:  Increase heparin to 1950 units/hr Recheck levels at [place], A[place] Crowford 02/25/2017,3:20 AM

## 2017-02-25 NOTE — Progress Notes (Signed)
[place] for heparin Indication: hx pulmonary embolus - bridge while off xarelto  No Known Allergies  Patient Measurements: Height: 5\' 6"  (167.6 cm) Weight: (!) 313 lb (142 kg) IBW/kg (Calculated) : 59.3 Heparin Dosing Weight: 94 kg  Vital Signs: Temp: 98.6 F (37 C) (01/17 0614) Temp Source: Oral (01/17 0614) BP: 138/86 (01/17 0614) Pulse Rate: 107 (01/17 0614)  Labs: Recent Labs    02/23/17 1731 02/24/17 0600 02/25/17 0148 02/25/17 1026  HGB 10.7*  --  10.2*  --   HCT 33.9*  --  32.1*  --   PLT 209  --  211  --   APTT 32 40* 52* 51*  LABPROT 17.6*  --   --   --   INR 1.46  --   --   --   HEPARINUNFRC 1.18*  --  0.55 0.56  CREATININE 0.66  --   --   --     Estimated Creatinine Clearance: 139.1 mL/min (by C-G formula based on SCr of 0.66 mg/dL).   Medications:  xarelto 20 mg daily PTA (Last dose on 02/22/17)  Assessment: Patient is a 22 yoF with hx PE on xarelto PTA, presented to [facility] on 02/23/17 for bronchoscopy due to persistent dyspnea and abnormal CXR.  Patient was transitioned to heparin on admission in anticipation for procedure. She underwent bronchoscopy, brushings and endobronchial biopsy on 1/16 with heparin drip stopped at 0800 for procedure. Per pulmonary, ok to resume heparin drip back at 8PM on 1/16.   Baseline INR slightly elevated at 1.46 d/t Xarelto, aPTT WNL  Prior anticoagulation: Xarelto 20 mg daily  Significant events: 1/16: Heparin off at 8a, resumed at 8p  Today, 02/25/2017:  CBC: hgb   Most recent heparin level therapeutic on 1950 units/hr although need to consider Xarelto post-effects. Most recent aPTT remains subtherapeutic and unchanged despite rate increase from 1850 to 1950 units/hr  No bleeding or infusion issues per nursing  CrCl: >90 ml/min  Goal of Therapy: Heparin level 0.3-0.7 units/ml Monitor platelets by anticoagulation protocol: Yes  Plan:  Continue heparin IV infusion at  1950 units/hr - although aPTT appears low, the fact that both heparin level and aPTT are stable (i.e. correlating) suggests that the Anti-Xa effect from Xarelto has worn off, and given that Anti-Xa activity is the more accurate measure of heparin effect, I am not inclined to further increase the rate for a low aPTT, especially since I suspect patient will transition back to Xarelto very soon.  Daily CBC and heparin level  Monitor for signs of bleeding or thrombosis   Reuel Boom, PharmD Pager: [phone] 02/25/2017, 11:43 AM

## 2017-02-25 NOTE — Progress Notes (Signed)
Date: February 25, 2017 Chart review for discharge needs:  None found for case management. Patient has no questions concerning post hospital care.

## 2017-02-25 NOTE — Discharge Summary (Signed)
Physician Discharge Summary  Patient ID: Crystal Gallagher MRN: [id number] DOB/AGE: [birth year] 39 y.o.  Admit date: 02/23/2017 Discharge date: 02/25/2017    Discharge Diagnoses:  LLL Airspace Disease / Collapse LLL Mass s/p Bronchoscopy > poorly differentiated adenocarcinoma, stains pending  Pulmonary Embolism  Cough GERD                                                                      DISCHARGE PLAN BY DIAGNOSIS     LLL Airspace Disease / Collapse LLL Mass s/p Bronchoscopy > poorly differentiated adenocarcinoma, stains pending   Discharge Plan: Follow up pathology final results > plan to call patient with results once returned  Will need ONC evaluation   Pulmonary Embolism   Discharge Plan: Resume Xarelto 20 mg QD Will give Rx to patient for home dosing to allow time for her to get back to her primary   Cough   Discharge Plan: Refill Hycodan Rx  GERD  Discharge Plan: Continue PPI                     DISCHARGE SUMMARY   37 y/o F, never smoker, followed by Dr. Lamonte Sakai for LLL airspace disease.  Patient was treated on multiple occasions for left lower lobe pneumonia.  She continued to have symptoms and abnormal chest x-ray.  Bronchoscopy was arranged that was deferred due to inclement weather.  She further underwent CT angiogram of the chest on 01/17/17 that showed pulmonary embolism predominantly on the right.  She was started on anticoagulation and transitioned to Xarelto. Her anticoagulation course was complicated by uterine bleeding that resolved.  CT imaging from 01/17/17 revealed progression of LLL infiltrate and an apparent cutoff sign.  She was admitted 1/15 for planned bronchoscopy with IV heparin bridge for anticoagulation.  The patient was taken for bronchoscopy on 39/19 which was complicated by persistent coughing and difficult to obtain tissue sampling.  Airway examination of the left lower lobe bronchus demonstrated raised, friable tissue.  Biopsy obtained and  concerning for poorly differentiated adenocarcinoma, final stains pending.  The patient tolerated the procedure well.  She was transitioned back to Xarelto 1/17.  The patient was medically cleared for discharge with plans as above.    SIGNIFICANT DIAGNOSTIC STUDIES 1/15  Admit for planned bronchoscopy  1/16  FOB   CYTOLOGY  1/16  FOB >> poorly differentiated adenocarcinoma >> special stains pending    Discharge Exam: General: morbidly obese female in NAD, sitting up in chair  Neuro: AAOx4, speech clear, MAE  CV: s1s2 rrr, no m/r/g PULM: even/non-labored, lungs bilaterally clear, dry /non-productive cough  GI: obese /soft, bsx4 active  Extremities: warm/dry, difficult to assess edema  Vitals:   02/24/17 1430 02/24/17 2147 02/25/17 0614 02/25/17 1408  BP: (!) 129/51 139/85 138/86 (!) 133/92  Pulse:  (!) 120 (!) 107 (!) 110  Resp: (!) 39 20 (!) 22 20  Temp:  98.9 F (37.2 C) 98.6 F (37 C) 98.6 F (37 C)  TempSrc:  Oral Oral Oral  SpO2: (!) 88% 93% 94% 95%  Weight:      Height:         Discharge Labs  BMET Recent Labs  Lab 02/23/17 1731  NA 140  K  3.7  CL 108  CO2 23  GLUCOSE 97  BUN 11  CREATININE 0.66  CALCIUM 9.2  MG 2.2  PHOS 3.4    CBC Recent Labs  Lab 02/23/17 1731 02/25/17 0148  HGB 10.7* 10.2*  HCT 33.9* 32.1*  WBC 15.5* 13.5*  PLT 209 211    Anti-Coagulation Recent Labs  Lab 02/23/17 1731  INR 1.46    Discharge Instructions    Call MD for:  difficulty breathing, headache or visual disturbances   Complete by:  As directed    Call MD for:  extreme fatigue   Complete by:  As directed    Call MD for:  hives   Complete by:  As directed    Call MD for:  persistant dizziness or light-headedness   Complete by:  As directed    Call MD for:  persistant nausea and vomiting   Complete by:  As directed    Call MD for:  redness, tenderness, or signs of infection (pain, swelling, redness, odor or green/yellow discharge around incision site)    Complete by:  As directed    Call MD for:  severe uncontrolled pain   Complete by:  As directed    Call MD for:  temperature >100.4   Complete by:  As directed    Diet general   Complete by:  As directed    Discharge instructions   Complete by:  As directed    1.  Resume Xarelto as prescribed 2. Call or report to the ER if you have new / worsening bleeding (coughing up blood etc) 3.  Dr. Lamonte Sakai will call you when your results are completed 4.  If you have new or worsening symptoms, call or report to the ER to be seen.   Increase activity slowly   Complete by:  As directed        Follow-up Information    Collene Gobble, MD Follow up.   Specialty:  Pulmonary Disease Contact information: [address] [place] [address] [phone]        Forrest Moron, MD. Schedule an appointment as soon as possible for a visit.   Specialty:  Internal Medicine Why:  Call for an appointment to be seen in 1-2 weeks Contact information: [place] [address] [phone]            Allergies as of 02/25/2017   No Known Allergies     Medication List    TAKE these medications   acetaminophen 500 MG tablet Commonly known as:  TYLENOL Take 1 tablet (500 mg total) by mouth every 6 (six) hours as needed for moderate pain. What changed:  how much to take   albuterol 108 (90 Base) MCG/ACT inhaler Commonly known as:  PROVENTIL HFA;VENTOLIN HFA Inhale 1-2 puffs into the lungs every 6 (six) hours as needed for wheezing. What changed:  Another medication with the same name was removed. Continue taking this medication, and follow the directions you see here.   ferrous sulfate 325 (65 FE) MG tablet Commonly known as:  FERROUSUL Take 1 tablet (325 mg total) by mouth daily with breakfast.   HYDROcodone-homatropine 5-1.5 MG/5ML syrup Commonly known as:  HYCODAN Take 5 mLs by mouth every 6 (six) hours as needed for cough.   norethindrone 5 MG tablet Commonly known  as:  AYGESTIN Take 2 tablets (10 mg total) by mouth daily.   omeprazole 20 MG capsule Commonly known as:  PRILOSEC Take 20 mg by  mouth 2 (two) times daily.   rivaroxaban 20 MG Tabs tablet Commonly known as:  XARELTO Take 1 tablet (20 mg total) by mouth daily with supper. Start taking on:  02/26/2017   senna-docusate 8.6-50 MG tablet Commonly known as:  Senokot-S Take 1 tablet by mouth at bedtime as needed for mild constipation.   traMADol 50 MG tablet Commonly known as:  ULTRAM Take 1 tablet (50 mg total) by mouth every 8 (eight) hours as needed.         Disposition:  Home, no new home health needs identified at time of discharge.   Discharged Condition: Crystal Gallagher has met maximum benefit of inpatient care and is medically stable and cleared for discharge.  Patient is pending follow up as above.      Time spent on disposition:  35 Minutes.   Signed: Noe Gens, NP-C [facility] Pgr: [phone] Office: [phone]

## 2017-02-26 ENCOUNTER — Telehealth: Payer: Self-pay | Admitting: Emergency Medicine

## 2017-02-26 NOTE — Telephone Encounter (Signed)
Spoke with pt. She is wanting her biopsy results.  RB - please advise. Thanks.

## 2017-02-28 ENCOUNTER — Emergency Department (HOSPITAL_COMMUNITY): Payer: 59

## 2017-02-28 ENCOUNTER — Other Ambulatory Visit: Payer: Self-pay

## 2017-02-28 ENCOUNTER — Encounter (HOSPITAL_COMMUNITY): Payer: Self-pay

## 2017-02-28 ENCOUNTER — Inpatient Hospital Stay (HOSPITAL_COMMUNITY)
Admission: EM | Admit: 2017-02-28 | Discharge: 2017-03-12 | DRG: 207 | Disposition: E | Payer: 59 | Attending: Pulmonary Disease | Admitting: Pulmonary Disease

## 2017-02-28 DIAGNOSIS — Z803 Family history of malignant neoplasm of breast: Secondary | ICD-10-CM | POA: Diagnosis not present

## 2017-02-28 DIAGNOSIS — D6489 Other specified anemias: Secondary | ICD-10-CM | POA: Diagnosis present

## 2017-02-28 DIAGNOSIS — I2699 Other pulmonary embolism without acute cor pulmonale: Secondary | ICD-10-CM | POA: Diagnosis not present

## 2017-02-28 DIAGNOSIS — Z6841 Body Mass Index (BMI) 40.0 and over, adult: Secondary | ICD-10-CM | POA: Diagnosis not present

## 2017-02-28 DIAGNOSIS — J9811 Atelectasis: Secondary | ICD-10-CM | POA: Diagnosis present

## 2017-02-28 DIAGNOSIS — J90 Pleural effusion, not elsewhere classified: Secondary | ICD-10-CM

## 2017-02-28 DIAGNOSIS — Z7901 Long term (current) use of anticoagulants: Secondary | ICD-10-CM

## 2017-02-28 DIAGNOSIS — C3432 Malignant neoplasm of lower lobe, left bronchus or lung: Principal | ICD-10-CM | POA: Diagnosis present

## 2017-02-28 DIAGNOSIS — J181 Lobar pneumonia, unspecified organism: Secondary | ICD-10-CM | POA: Diagnosis present

## 2017-02-28 DIAGNOSIS — J9601 Acute respiratory failure with hypoxia: Secondary | ICD-10-CM

## 2017-02-28 DIAGNOSIS — R590 Localized enlarged lymph nodes: Secondary | ICD-10-CM | POA: Diagnosis not present

## 2017-02-28 DIAGNOSIS — N179 Acute kidney failure, unspecified: Secondary | ICD-10-CM | POA: Diagnosis present

## 2017-02-28 DIAGNOSIS — Z66 Do not resuscitate: Secondary | ICD-10-CM | POA: Diagnosis not present

## 2017-02-28 DIAGNOSIS — I468 Cardiac arrest due to other underlying condition: Secondary | ICD-10-CM | POA: Diagnosis not present

## 2017-02-28 DIAGNOSIS — R918 Other nonspecific abnormal finding of lung field: Secondary | ICD-10-CM | POA: Diagnosis not present

## 2017-02-28 DIAGNOSIS — Z823 Family history of stroke: Secondary | ICD-10-CM

## 2017-02-28 DIAGNOSIS — J9819 Other pulmonary collapse: Secondary | ICD-10-CM | POA: Diagnosis not present

## 2017-02-28 DIAGNOSIS — J189 Pneumonia, unspecified organism: Secondary | ICD-10-CM | POA: Diagnosis not present

## 2017-02-28 DIAGNOSIS — C3492 Malignant neoplasm of unspecified part of left bronchus or lung: Secondary | ICD-10-CM

## 2017-02-28 DIAGNOSIS — K5900 Constipation, unspecified: Secondary | ICD-10-CM | POA: Diagnosis present

## 2017-02-28 DIAGNOSIS — Z86711 Personal history of pulmonary embolism: Secondary | ICD-10-CM | POA: Diagnosis not present

## 2017-02-28 DIAGNOSIS — E872 Acidosis: Secondary | ICD-10-CM | POA: Diagnosis present

## 2017-02-28 DIAGNOSIS — J45909 Unspecified asthma, uncomplicated: Secondary | ICD-10-CM | POA: Diagnosis present

## 2017-02-28 DIAGNOSIS — R0682 Tachypnea, not elsewhere classified: Secondary | ICD-10-CM

## 2017-02-28 DIAGNOSIS — Z800 Family history of malignant neoplasm of digestive organs: Secondary | ICD-10-CM | POA: Diagnosis not present

## 2017-02-28 DIAGNOSIS — J9600 Acute respiratory failure, unspecified whether with hypoxia or hypercapnia: Secondary | ICD-10-CM | POA: Diagnosis not present

## 2017-02-28 DIAGNOSIS — G9589 Other specified diseases of spinal cord: Secondary | ICD-10-CM | POA: Diagnosis not present

## 2017-02-28 DIAGNOSIS — Z793 Long term (current) use of hormonal contraceptives: Secondary | ICD-10-CM

## 2017-02-28 DIAGNOSIS — G9340 Encephalopathy, unspecified: Secondary | ICD-10-CM | POA: Diagnosis not present

## 2017-02-28 DIAGNOSIS — A419 Sepsis, unspecified organism: Secondary | ICD-10-CM | POA: Diagnosis not present

## 2017-02-28 DIAGNOSIS — M79604 Pain in right leg: Secondary | ICD-10-CM | POA: Diagnosis present

## 2017-02-28 DIAGNOSIS — R0602 Shortness of breath: Secondary | ICD-10-CM | POA: Diagnosis present

## 2017-02-28 DIAGNOSIS — N939 Abnormal uterine and vaginal bleeding, unspecified: Secondary | ICD-10-CM | POA: Diagnosis present

## 2017-02-28 DIAGNOSIS — C3490 Malignant neoplasm of unspecified part of unspecified bronchus or lung: Secondary | ICD-10-CM | POA: Diagnosis not present

## 2017-02-28 DIAGNOSIS — Z9289 Personal history of other medical treatment: Secondary | ICD-10-CM

## 2017-02-28 DIAGNOSIS — Z801 Family history of malignant neoplasm of trachea, bronchus and lung: Secondary | ICD-10-CM | POA: Diagnosis not present

## 2017-02-28 DIAGNOSIS — Z8701 Personal history of pneumonia (recurrent): Secondary | ICD-10-CM

## 2017-02-28 DIAGNOSIS — E669 Obesity, unspecified: Secondary | ICD-10-CM | POA: Diagnosis not present

## 2017-02-28 DIAGNOSIS — C7951 Secondary malignant neoplasm of bone: Secondary | ICD-10-CM | POA: Diagnosis present

## 2017-02-28 DIAGNOSIS — C7801 Secondary malignant neoplasm of right lung: Secondary | ICD-10-CM | POA: Diagnosis not present

## 2017-02-28 DIAGNOSIS — E878 Other disorders of electrolyte and fluid balance, not elsewhere classified: Secondary | ICD-10-CM | POA: Diagnosis present

## 2017-02-28 DIAGNOSIS — R0603 Acute respiratory distress: Secondary | ICD-10-CM

## 2017-02-28 DIAGNOSIS — J9602 Acute respiratory failure with hypercapnia: Secondary | ICD-10-CM | POA: Diagnosis not present

## 2017-02-28 DIAGNOSIS — T17908S Unspecified foreign body in respiratory tract, part unspecified causing other injury, sequela: Secondary | ICD-10-CM | POA: Diagnosis not present

## 2017-02-28 LAB — I-STAT CG4 LACTIC ACID, ED
LACTIC ACID, VENOUS: 1.06 mmol/L (ref 0.5–1.9)
Lactic Acid, Venous: 1.52 mmol/L (ref 0.5–1.9)

## 2017-02-28 LAB — URINALYSIS, ROUTINE W REFLEX MICROSCOPIC
Bilirubin Urine: NEGATIVE
GLUCOSE, UA: NEGATIVE mg/dL
KETONES UR: 5 mg/dL — AB
Leukocytes, UA: NEGATIVE
Nitrite: NEGATIVE
PH: 5 (ref 5.0–8.0)
Protein, ur: 30 mg/dL — AB
Specific Gravity, Urine: 1.035 — ABNORMAL HIGH (ref 1.005–1.030)

## 2017-02-28 LAB — RESPIRATORY PANEL BY PCR
ADENOVIRUS-RVPPCR: NOT DETECTED
Bordetella pertussis: NOT DETECTED
CHLAMYDOPHILA PNEUMONIAE-RVPPCR: NOT DETECTED
CORONAVIRUS NL63-RVPPCR: NOT DETECTED
Coronavirus 229E: NOT DETECTED
Coronavirus HKU1: NOT DETECTED
Coronavirus OC43: NOT DETECTED
INFLUENZA A-RVPPCR: NOT DETECTED
INFLUENZA B-RVPPCR: NOT DETECTED
Metapneumovirus: NOT DETECTED
Mycoplasma pneumoniae: NOT DETECTED
PARAINFLUENZA VIRUS 4-RVPPCR: NOT DETECTED
Parainfluenza Virus 1: NOT DETECTED
Parainfluenza Virus 2: NOT DETECTED
Parainfluenza Virus 3: NOT DETECTED
RESPIRATORY SYNCYTIAL VIRUS-RVPPCR: NOT DETECTED
Rhinovirus / Enterovirus: NOT DETECTED

## 2017-02-28 LAB — CBC WITH DIFFERENTIAL/PLATELET
BASOS ABS: 0 10*3/uL (ref 0.0–0.1)
BASOS PCT: 0 %
EOS ABS: 0.9 10*3/uL — AB (ref 0.0–0.7)
EOS PCT: 6 %
HCT: 36 % (ref 36.0–46.0)
Hemoglobin: 11 g/dL — ABNORMAL LOW (ref 12.0–15.0)
LYMPHS ABS: 1.5 10*3/uL (ref 0.7–4.0)
Lymphocytes Relative: 10 %
MCH: 27.4 pg (ref 26.0–34.0)
MCHC: 30.6 g/dL (ref 30.0–36.0)
MCV: 89.8 fL (ref 78.0–100.0)
Monocytes Absolute: 1 10*3/uL (ref 0.1–1.0)
Monocytes Relative: 7 %
NEUTROS PCT: 77 %
Neutro Abs: 11.7 10*3/uL — ABNORMAL HIGH (ref 1.7–7.7)
PLATELETS: 163 10*3/uL (ref 150–400)
RBC: 4.01 MIL/uL (ref 3.87–5.11)
RDW: 15.4 % (ref 11.5–15.5)
WBC: 15.2 10*3/uL — ABNORMAL HIGH (ref 4.0–10.5)

## 2017-02-28 LAB — I-STAT ARTERIAL BLOOD GAS, ED
Acid-base deficit: 4 mmol/L — ABNORMAL HIGH (ref 0.0–2.0)
Bicarbonate: 20.4 mmol/L (ref 20.0–28.0)
O2 Saturation: 100 %
PCO2 ART: 34.5 mmHg (ref 32.0–48.0)
TCO2: 21 mmol/L — AB (ref 22–32)
pH, Arterial: 7.385 (ref 7.350–7.450)
pO2, Arterial: 181 mmHg — ABNORMAL HIGH (ref 83.0–108.0)

## 2017-02-28 LAB — I-STAT TROPONIN, ED: TROPONIN I, POC: 0.02 ng/mL (ref 0.00–0.08)

## 2017-02-28 LAB — PROCALCITONIN: Procalcitonin: 0.34 ng/mL

## 2017-02-28 LAB — BASIC METABOLIC PANEL
Anion gap: 14 (ref 5–15)
BUN: 10 mg/dL (ref 6–20)
CO2: 19 mmol/L — ABNORMAL LOW (ref 22–32)
Calcium: 9 mg/dL (ref 8.9–10.3)
Chloride: 108 mmol/L (ref 101–111)
Creatinine, Ser: 0.78 mg/dL (ref 0.44–1.00)
Glucose, Bld: 106 mg/dL — ABNORMAL HIGH (ref 65–99)
POTASSIUM: 3.9 mmol/L (ref 3.5–5.1)
SODIUM: 141 mmol/L (ref 135–145)

## 2017-02-28 LAB — STREP PNEUMONIAE URINARY ANTIGEN: Strep Pneumo Urinary Antigen: NEGATIVE

## 2017-02-28 LAB — I-STAT BETA HCG BLOOD, ED (MC, WL, AP ONLY): HCG, QUANTITATIVE: 9.6 m[IU]/mL — AB (ref <5)

## 2017-02-28 LAB — BRAIN NATRIURETIC PEPTIDE: B NATRIURETIC PEPTIDE 5: 216.5 pg/mL — AB (ref 0.0–100.0)

## 2017-02-28 LAB — POC URINE PREG, ED: Preg Test, Ur: NEGATIVE

## 2017-02-28 MED ORDER — KETOROLAC TROMETHAMINE 15 MG/ML IJ SOLN
15.0000 mg | Freq: Three times a day (TID) | INTRAMUSCULAR | Status: DC | PRN
  Administered 2017-02-28: 15 mg via INTRAVENOUS
  Filled 2017-02-28: qty 1

## 2017-02-28 MED ORDER — IOPAMIDOL (ISOVUE-370) INJECTION 76%
INTRAVENOUS | Status: AC
  Administered 2017-02-28: 100 mL
  Filled 2017-02-28: qty 100

## 2017-02-28 MED ORDER — HYDROCODONE-HOMATROPINE 5-1.5 MG/5ML PO SYRP
5.0000 mL | ORAL_SOLUTION | ORAL | Status: DC | PRN
  Administered 2017-02-28: 5 mL via ORAL
  Filled 2017-02-28: qty 5

## 2017-02-28 MED ORDER — ACETAMINOPHEN 500 MG PO TABS
1000.0000 mg | ORAL_TABLET | Freq: Once | ORAL | Status: AC
Start: 2017-02-28 — End: 2017-02-28
  Administered 2017-02-28: 1000 mg via ORAL
  Filled 2017-02-28: qty 2

## 2017-02-28 MED ORDER — IPRATROPIUM-ALBUTEROL 0.5-2.5 (3) MG/3ML IN SOLN
3.0000 mL | Freq: Once | RESPIRATORY_TRACT | Status: AC
  Administered 2017-02-28: 3 mL via RESPIRATORY_TRACT
  Filled 2017-02-28: qty 3

## 2017-02-28 MED ORDER — KETOROLAC TROMETHAMINE 30 MG/ML IJ SOLN
30.0000 mg | Freq: Once | INTRAMUSCULAR | Status: AC
  Administered 2017-02-28: 30 mg via INTRAVENOUS
  Filled 2017-02-28: qty 1

## 2017-02-28 MED ORDER — ENOXAPARIN SODIUM 150 MG/ML ~~LOC~~ SOLN
140.0000 mg | Freq: Two times a day (BID) | SUBCUTANEOUS | Status: DC
  Administered 2017-02-28 – 2017-03-01 (×2): 140 mg via SUBCUTANEOUS
  Filled 2017-02-28 (×2): qty 0.93

## 2017-02-28 MED ORDER — ENOXAPARIN SODIUM 150 MG/ML ~~LOC~~ SOLN
140.0000 mg | Freq: Two times a day (BID) | SUBCUTANEOUS | Status: DC
  Filled 2017-02-28: qty 0.93

## 2017-02-28 MED ORDER — ENOXAPARIN SODIUM 150 MG/ML ~~LOC~~ SOLN: 140.0000 mg | Freq: Two times a day (BID) | SUBCUTANEOUS | Status: DC

## 2017-02-28 MED ORDER — PIPERACILLIN-TAZOBACTAM 3.375 G IVPB: 3.3750 g | Freq: Three times a day (TID) | INTRAVENOUS | Status: DC

## 2017-02-28 MED ORDER — SODIUM CHLORIDE 0.9 % IV BOLUS (SEPSIS)
1000.0000 mL | Freq: Once | INTRAVENOUS | Status: AC
  Administered 2017-02-28: 1000 mL via INTRAVENOUS

## 2017-02-28 MED ORDER — VANCOMYCIN HCL 10 G IV SOLR
2000.0000 mg | Freq: Once | INTRAVENOUS | Status: AC
  Administered 2017-02-28: 2000 mg via INTRAVENOUS
  Filled 2017-02-28: qty 2000

## 2017-02-28 MED ORDER — FUROSEMIDE 10 MG/ML IJ SOLN
20.0000 mg | Freq: Once | INTRAMUSCULAR | Status: AC
  Administered 2017-02-28: 20 mg via INTRAVENOUS
  Filled 2017-02-28: qty 2

## 2017-02-28 MED ORDER — VANCOMYCIN HCL 10 G IV SOLR
1500.0000 mg | Freq: Three times a day (TID) | INTRAVENOUS | Status: DC
  Administered 2017-02-28 – 2017-03-01 (×3): 1500 mg via INTRAVENOUS
  Filled 2017-02-28 (×4): qty 1500

## 2017-02-28 MED ORDER — DEXTROSE 5 % IV SOLN
2.0000 g | Freq: Three times a day (TID) | INTRAVENOUS | Status: DC
  Administered 2017-02-28 – 2017-03-08 (×22): 2 g via INTRAVENOUS
  Filled 2017-02-28 (×27): qty 2

## 2017-02-28 MED ORDER — SODIUM CHLORIDE 0.9 % IV BOLUS (SEPSIS): 1000.0000 mL | Freq: Once | INTRAVENOUS | Status: DC

## 2017-02-28 MED ORDER — IPRATROPIUM-ALBUTEROL 0.5-2.5 (3) MG/3ML IN SOLN
3.0000 mL | RESPIRATORY_TRACT | Status: DC | PRN
  Filled 2017-02-28: qty 3

## 2017-02-28 MED ORDER — FERROUS SULFATE 325 (65 FE) MG PO TABS: 325.0000 mg | ORAL_TABLET | Freq: Every day | ORAL | Status: DC

## 2017-02-28 MED ORDER — ALBUTEROL SULFATE (2.5 MG/3ML) 0.083% IN NEBU: 2.5000 mg | INHALATION_SOLUTION | RESPIRATORY_TRACT | Status: DC | PRN

## 2017-02-28 MED ORDER — DEXTROSE 5 % IV SOLN
2.0000 g | Freq: Once | INTRAVENOUS | Status: AC
  Administered 2017-02-28: 2 g via INTRAVENOUS
  Filled 2017-02-28: qty 2

## 2017-02-28 MED ORDER — FUROSEMIDE 10 MG/ML IJ SOLN: 40.0000 mg | Freq: Once | INTRAMUSCULAR | Status: DC

## 2017-02-28 MED ORDER — ALBUTEROL SULFATE (2.5 MG/3ML) 0.083% IN NEBU
5.0000 mg | INHALATION_SOLUTION | Freq: Once | RESPIRATORY_TRACT | Status: DC
  Filled 2017-02-28: qty 6

## 2017-02-28 NOTE — ED Notes (Signed)
Patient has returned from CT

## 2017-02-28 NOTE — ED Notes (Signed)
Admitting team at bedside.

## 2017-02-28 NOTE — Progress Notes (Signed)
Pt is not in the room at this time. ABG/nebs are pending. RN reports that patient is off the floor for imaging.

## 2017-02-28 NOTE — ED Notes (Signed)
RN notified of Sepsis  

## 2017-02-28 NOTE — Progress Notes (Signed)
RT removed patient from BIPAP and placed on 100% NRB  O2 sats maintaining at 100%. Dr. Vaughan Browner and NP, Richardson Landry Minor at bedside. RT will continue to monitor.

## 2017-02-28 NOTE — Consult Note (Signed)
PULMONARY / CRITICAL CARE MEDICINE   Name: Crystal Gallagher MRN: [id number] DOB: August 18, 1980    ADMISSION DATE:  02/15/2017 CONSULTATION DATE:  1/20  REFERRING MD:  Dr Dareen Piano  CHIEF COMPLAINT:  Concern for left pleural effusion , progressive dyspnea   HISTORY OF PRESENT ILLNESS:    This is a 37 yo woman with history of asthma, pneumonia, PE, lifetime nonsmoker, who presented for progressively worsening dyspnea.  She was hospitalized at [facility] for a bronchoscopy that was completed on 02/24/2017 with Dr. Lamonte Sakai. Since that procedure the patient has noticed progressively worsening shortness of breath with exertion. She states that over the past few days it has progressively worsened, and she also endorses intermittent cough with rust-colored sputum, which has occurred since her brochoscopy. Patient notes intermittent fevers and chills over the past few days. Denies chest pain, nausea, vomiting, bilateral lower extremity swelling, dysuria, increased urinary frequency, and change in bowel movements, (per ROS taken by the admitting intern.)  She underwent a CT angio of the chest on 12/9 which showed PE predominant on the right side. She was started on anticoagulation and transitioned to xarelto. AC course was complicated by uterine bleeding that resolved. CT imaging from 01/17/17 revealed progression of LLL infiltrate and an apparent cutoff sign.  She was admitted 1/15 for planned bronchoscopy with IV heparin bridge for anticoagulation.  The patient was taken for bronchoscopy on 8/24 which was complicated by persistent coughing and difficult to obtain tissue sampling. An area of erythema, edema was observed in the L lower lobe. A non-obstructing and friable submucosal lesion was identified at the LUL carina that extended into LLL. This endobronchial mass did not obstruct the airway and was biopsied during the procedure. 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 The results were not returned to the patient when she presented to the ED.  In the ER, we were consulted because of questionable effusion, and the possibility of thoracentesis. We performed ultrasound, and there was no fluid to be seen.     PAST MEDICAL HISTORY :  She  has a past medical history of Asthma, Cavitating mass in left lower lung lobe, Pneumonia, and Pulmonary embolism ([place]).  PAST SURGICAL HISTORY: She  has a past surgical history that includes Appendectomy; Breast surgery (breast reduction); and Video bronchoscopy (Bilateral, 02/24/2017).  No Known Allergies  No current facility-administered medications on file prior to encounter.    Current Outpatient Medications on File Prior to Encounter  Medication Sig  . acetaminophen (TYLENOL) 500 MG tablet Take 1 tablet (500 mg total) by mouth every 6 (six) hours as needed for moderate pain.  Marland Kitchen albuterol (PROVENTIL HFA;VENTOLIN HFA) 108 (90 Base) MCG/ACT inhaler Inhale 1-2 puffs into the lungs every 6 (six) hours as needed for wheezing.  . ferrous sulfate (FERROUSUL) 325 (65 FE) MG tablet Take 1 tablet (325 mg total) by mouth daily with breakfast.  . HYDROcodone-homatropine (HYCODAN) 5-1.5 MG/5ML syrup Take 5 mLs by mouth every 6 (six) hours as needed for cough.  . norethindrone (AYGESTIN) 5 MG tablet Take 2 tablets (10 mg total) by mouth daily.  . rivaroxaban (XARELTO) 20 MG TABS tablet Take 1 tablet (20 mg total) by mouth daily with supper.  . traMADol (ULTRAM) 50 MG tablet Take 1 tablet (50 mg total) by mouth every 8 (eight) hours as needed. (Patient not taking: Reported on 02/12/2017)    FAMILY HISTORY:  Her indicated that her mother is alive. She  indicated that her father is deceased. She indicated that her sister is alive. She indicated that her brother is alive. She indicated that her maternal grandmother is deceased. She indicated that her maternal grandfather is deceased. She indicated that her  paternal grandmother is deceased. She indicated that her paternal grandfather is deceased. She indicated that both of her maternal aunts are deceased. She indicated that her paternal uncle is alive. She indicated that the status of her other is unknown.   SOCIAL HISTORY: She  reports that  has never smoked. she has never used smokeless tobacco. She reports that she drinks alcohol. She reports that she does not use drugs.  REVIEW OF SYSTEMS:    Patient notes intermittent fevers and chills over the past few days. Denies chest pain, nausea, vomiting, bilateral lower extremity swelling, dysuria, increased urinary frequency, and change in bowel movements, per ROS taken by the admitting intern.  SUBJECTIVE:   Patient had just found out about the adenocarcinoma diagnosis and was understandably worried and family was at bedside.   VITAL SIGNS: BP 134/72   Pulse (!) 110   Temp (!) 100.4 F (38 C) (Rectal)   Resp (!) 36   Ht 5\' 6"  (1.676 m)   Wt (!) 310 lb (140.6 kg)   LMP 02/01/2017 (Approximate) Comment: pt shielded  SpO2 98%   BMI 50.04 kg/m   HEMODYNAMICS:    VENTILATOR SETTINGS:    INTAKE / OUTPUT: No intake/output data recorded.  PHYSICAL EXAMINATION: General:  Obese woman, speaking in intermittent sentences, on venti-mask  Neuro:  A&o x 3 and following all commands  HEENT:  Ncat, mmm Cardiovascular:   RRR, no m/r/g Lungs:  Absence of breath sounds on left lower lung base,normal breath sounds on left upper, and right side.  No wheezing or crackles appreciated.  Abdomen:  Soft, nontender,obese, nondistended, normal bowel sounds  Musculoskeletal: no edema, normal pulses in extremities    Skin:  No rash, intact skin and normal turgor   LABS:  BMET Recent Labs  Lab 02/23/17 1731 02/13/2017 0618  NA 140 141  K 3.7 3.9  CL 108 108  CO2 23 19*  BUN 11 10  CREATININE 0.66 0.78  GLUCOSE 97 106*    Electrolytes Recent Labs  Lab 02/23/17 1731 02/23/2017 0618  CALCIUM  9.2 9.0  MG 2.2  --   PHOS 3.4  --     CBC Recent Labs  Lab 02/23/17 1731 02/25/17 0148 03/11/2017 0618  WBC 15.5* 13.5* 15.2*  HGB 10.7* 10.2* 11.0*  HCT 33.9* 32.1* 36.0  PLT 209 211 163    Coag's Recent Labs  Lab 02/23/17 1731 02/24/17 0600 02/25/17 0148 02/25/17 1026  APTT 32 40* 52* 51*  INR 1.46  --   --   --     Sepsis Markers Recent Labs  Lab 02/09/2017 0712 03/10/2017 1017  LATICACIDVEN 1.52 1.06    ABG Recent Labs  Lab 02/21/2017 0738  PHART 7.385  PCO2ART 34.5  PO2ART 181.0*    Liver Enzymes No results for input(s): AST, ALT, ALKPHOS, BILITOT, ALBUMIN in the last 168 hours.  Cardiac Enzymes No results for input(s): TROPONINI, PROBNP in the last 168 hours.  Glucose No results for input(s): GLUCAP in the last 168 hours.  Imaging Dg Chest 1 View  Result Date: 02/25/2017 CLINICAL DATA:  37 year old female with shortness of breath. EXAM: CHEST 1 VIEW COMPARISON:  Chest radiograph dated 02/15/2017 FINDINGS: There is a small left pleural effusion with left mid to  lower lung airspace density which may represent atelectasis versus infiltrate mild diffuse central vascular prominence may represent mild vascular congestion there is a patchy area of airspace opacity in the right upper lung field most concerning for pneumonia. Clinical correlation is recommended. There is no pneumothorax. Stable cardiac silhouette. No acute osseous pathology. IMPRESSION: 1. Left-sided pleural effusion and left lower lobe atelectasis versus infiltrate. 2. Patchy right upper lobe opacity concerning for pneumonia. Clinical correlation is recommended. 3. Probable mild vascular congestion and mild edema. 4. No pneumothorax. Electronically Signed   By: Anner Crete M.D.   On: 02/17/2017 06:59   Ct Angio Chest Pe W And/or Wo Contrast  Result Date: 02/23/2017 CLINICAL DATA:  37 year old female with recent history of pulmonary embolism in December 2018. Shortness of breath. EXAM: CT  ANGIOGRAPHY CHEST WITH CONTRAST TECHNIQUE: Multidetector CT imaging of the chest was performed using the standard protocol during bolus administration of intravenous contrast. Multiplanar CT image reconstructions and MIPs were obtained to evaluate the vascular anatomy. CONTRAST:  160mL ISOVUE-370 IOPAMIDOL (ISOVUE-370) INJECTION 76% COMPARISON:  Chest CT 01/17/2017. FINDINGS: Comment: Today's study is limited by considerable patient respiratory motion. Cardiovascular: No central, lobar or segmental sized filling defects are noted in the pulmonary arterial tree to suggest clinically relevant pulmonary embolism. Smaller subsegmental size filling defects cannot be entirely excluded secondary to respiratory motion. Heart size is borderline enlarged. There is no significant pericardial fluid, thickening or pericardial calcification. No significant aortic atherosclerosis. No coronary artery calcifications. Mediastinum/Nodes: Multiple borderline enlarged and mildly enlarged mediastinal and bilateral hilar lymph nodes, largest of which is in the left hilar region measuring 1.7 cm in short axis. Esophagus is unremarkable in appearance. No axillary lymphadenopathy. Lungs/Pleura: Persistent complete consolidation of the left lower lobe. Abrupt cut off of the left lower lobe bronchus noted on axial image 47 of series 5. Small to moderate left parapneumonic pleural effusion, increased compared to the prior study, predominantly located in the subpulmonic position. Widespread patchy interstitial and airspace disease scattered throughout the lungs bilaterally, most confluent in the right upper lobe, significantly worsened compared to the prior examination. Upper Abdomen: Unremarkable. Musculoskeletal: Multiple developing areas of sclerosis throughout the visualized axial and appendicular skeleton, new compared to the prior study from 01/17/2017, concerning for potential metastatic disease. A good example of this is a new 12 mm  lesion in the left side of the T11 vertebral body (axial image 79 of series 8). Review of the MIP images confirms the above findings. IMPRESSION: 1. Study was limited by considerable patient respiratory motion. With this limitation in mind there is no evidence of central, lobar or segmental sized pulmonary embolism. 2. Severe multilobar pneumonia. Notably, the patient has complete left lower lobe consolidation persists since 01/17/2017. This is highly unusual, and concerning for probable central obstruction. Left lower lobe bronchus is abruptly cut off on axial image 47 of scan series 5 of today's examination, which could be extrinsic or could be related to an endobronchial lesion. Correlation with bronchoscopy should be considered if not recently performed. New small to moderate parapneumonic pleural effusion, predominantly located sub pulmonic. 3. Interval development of multiple sclerotic osseous lesions. This is highly unusual. The possibility of metastatic disease to the bone should be considered. Electronically Signed   By: Vinnie Langton M.D.   On: 03/02/2017 09:53     STUDIES:   CT Angio: IMPRESSION: 1. Study was limited by considerable patient respiratory motion. With this limitation in mind there is no evidence of central, lobar or  segmental sized pulmonary embolism. 2. Severe multilobar pneumonia. Notably, the patient has complete left lower lobe consolidation persists since 01/17/2017. This is highly unusual, and concerning for probable central obstruction. Left lower lobe bronchus is abruptly cut off on axial image 47 of scan series 5 of today's examination, which could be extrinsic or could be related to an endobronchial lesion. Correlation with bronchoscopy should be considered if not recently performed. New small to moderate parapneumonic pleural effusion, predominantly located sub pulmonic. 3. Interval development of multiple sclerotic osseous lesions. This is highly unusual.  The possibility of metastatic disease to the bone should be considered.  CXR: 1/20 IMPRESSION: 1. Left-sided pleural effusion and left lower lobe atelectasis versus infiltrate. 2. Patchy right upper lobe opacity concerning for pneumonia. Clinical correlation is recommended. 3. Probable mild vascular congestion and mild edema. 4. No pneumothorax.  CT Angio: 93/5 Extensive embolic disease within the right pulmonary arterial tree. Evidence of right ventricular strain. Positive for acute PE with CT evidence of right heart strain (RV/LV Ratio = 1.7) consistent with at least submassive (intermediate risk) PE. The presence of right heart strain has been associated with an increased risk of morbidity and mortality. Please activate Code PE by paging [phone].  Worsened consolidation and collapse throughout the left lower lobe. Patchy pneumonia present throughout the left upper lobe.  Bilateral hilar and subcarinal lymphadenopathy, presumably reactive. Cannot rule out the possibility of underlying process such as sarcoid.  Evidence of left ventricular hypertrophy.     CULTURES:   ANTIBIOTICS: Vanc 1/20 >> Cefepime 1/20 >>  SIGNIFICANT EVENTS: Bronchoscopy biopsy result 1/17 Immunohistochemistry shows the tumor is strongly positive for Napsin A and thyroid transcription. factor-1 (TTF-1) and negative with cytokeratin 5/6. The immunophenotype is consistent with primary lung adenocarcinoma. (JDP:ecj 02/25/2017)  LINES/TUBES:   DISCUSSION: 37 year old woman recently diagnosed with PE on xarelto, who recently had a bronchoscopy done presents with worsening shortness of breath, and biopsy results notable for primary lung adenocarcinoma of the lung.   ASSESSMENT / PLAN:  Acute hypoxic respiratory failure secondary to HCAP, and primary adenocarcinoma of the lung with partial lung collapse:  Recent bronchoscopy and biopsy of the endobronchial LLL mass shows primary  adenocarcinoma of the lung. Ultrasound was done which does not show any left pleural effusion, and so recommend treating with broad spectrum antibiotic coverage for HCAP.   -recommend oncology consult -continue with vanc and cefepime, -agreewith urine strep antigen, legionella,  -duonebs q6 hours  History of PE:  -in the setting of recent diagnosis of adenocarcinoma, agree with continuing lovenox for treatment of PE    Primary lung adenocarcinoma:  -recommend oncology consult     Signed Burgess Estelle, MD, MPH IMT[facility]  Critical Care Service    Pulmonary and [place] Pager: [phone]  02/26/2017, 3:52 PM

## 2017-02-28 NOTE — ED Notes (Signed)
Pur Wick placed, instructions given for use. 

## 2017-02-28 NOTE — ED Provider Notes (Signed)
[facility] PROGRESSIVE CARE Provider Note   CSN: [id number] Arrival date & time: 02/15/2017  0554     History   Chief Complaint Chief Complaint  Patient presents with  . Shortness of Breath    HPI Crystal Gallagher is a 37 y.o. female h/o right PE on xarelto, asthma, recurrent pneumonia, chronic cough, uterine bleeding on Provera, recent bronchoscopy and biopsy on 02/25/16 presents for worsening SOB since yesterday. Is becoming winded with taking 2-3 steps at home. She a chronic cough and now having bloody sputum, streaks. Associated symptoms include some subjective fevers. She has been compliant with home medications and Xarelto. She denies chills, chest pain, back pain, palpitations, lower extremity edema, orthopnea, abdominal pain, nausea, vomiting, dysuria, diarrhea, melena, hematochezia, constipation. No h/o tobacco abuse, COPD, CHF.   Patient had bronchoscopy 4 days ago for unresolving left lower lobe infiltrate by Dr. Lamonte Sakai which revealed atelectasis of the left lower lobe, unresolving left lower lobe infiltrate, erythema/edema of the left lower lobe, new lesion to the left lower lobe which was biopsied. HPI  Past Medical History:  Diagnosis Date  . Asthma   . 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 Pneumonia   . Pulmonary embolism [facility])     Patient Active Problem List   Diagnosis Date Noted  . Sepsis ([place]) 02/27/2017  . Shortness of breath   . Collapse of left lung 02/23/2017  . Left lower lobe pulmonary infiltrate 02/19/2017  . Uterine bleeding 02/15/2017  . Anemia 02/15/2017  . Abnormal vaginal bleeding 02/15/2017  . Cough syncope 01/22/2017  . Mediastinal lymphadenopathy   . Pulmonary embolism on right ([place]) 01/17/2017  . Chronic cough 01/12/2017  . HCAP (healthcare-associated pneumonia) 12/05/2016  . Acute respiratory failure with hypoxia ([place]) 12/05/2016  . Asthma 12/05/2016  . Morbid obesity with BMI of 45.0-49.9, adult ([place]) 12/05/2016  . Pleuritic  chest pain 12/05/2016    Past Surgical History:  Procedure Laterality Date  . APPENDECTOMY    . BREAST SURGERY  breast reduction  . VIDEO BRONCHOSCOPY Bilateral 02/24/2017   Procedure: VIDEO BRONCHOSCOPY WITH FLUORO;  Surgeon: Collene Gobble, MD;  Location: [facility];  Service: Cardiopulmonary;  Laterality: Bilateral;    OB History    Gravida Para Term Preterm AB Living   1 0 0 0 1 0   SAB TAB Ectopic Multiple Live Births   1 0 0 0 0       Home Medications    Prior to Admission medications   Medication Sig Start Date End Date Taking? Authorizing Provider  acetaminophen (TYLENOL) 500 MG tablet Take 1 tablet (500 mg total) by mouth every 6 (six) hours as needed for moderate pain. 02/25/17  Yes Ollis, Cleaster Corin, NP  albuterol (PROVENTIL HFA;VENTOLIN HFA) 108 (90 Base) MCG/ACT inhaler Inhale 1-2 puffs into the lungs every 6 (six) hours as needed for wheezing. 10/30/16  Yes Tanna Furry, MD  ferrous sulfate (FERROUSUL) 325 (65 FE) MG tablet Take 1 tablet (325 mg total) by mouth daily with breakfast. 02/06/17  Yes Leftwich-Kirby, Kathie Dike, CNM  HYDROcodone-homatropine (HYCODAN) 5-1.5 MG/5ML syrup Take 5 mLs by mouth every 6 (six) hours as needed for cough. 02/25/17  Yes Ollis, Cleaster Corin, NP  norethindrone (AYGESTIN) 5 MG tablet Take 2 tablets (10 mg total) by mouth daily. 02/16/17  Yes Sloan Leiter, MD  rivaroxaban (XARELTO) 20 MG TABS tablet Take 1 tablet (20 mg total) by mouth daily with supper. 02/26/17  Yes Ollis,  Brandi L, NP  traMADol (ULTRAM) 50 MG tablet Take 1 tablet (50 mg total) by mouth every 8 (eight) hours as needed. Patient not taking: Reported on 02/14/2017 02/11/17   Forrest Moron, MD    Family History Family History  Problem Relation Age of Onset  . Cancer Other   . Hypertension Other   . Arthritis Mother   . Colon cancer Father   . Breast cancer Maternal Aunt   . Colon cancer Paternal Uncle   . Stroke Maternal Grandmother   . Cancer Maternal Grandfather   .  Hypertension Maternal Grandfather   . Alzheimer's disease Paternal Grandmother   . Breast cancer Maternal Aunt     Social History Social History   Tobacco Use  . Smoking status: Never Smoker  . Smokeless tobacco: Never Used  Substance Use Topics  . Alcohol use: Yes    Comment: occasionally  . Drug use: No     Allergies   Patient has no known allergies.   Review of Systems Review of Systems  Respiratory: Positive for cough and shortness of breath.        Hemoptysis  Hematological: Bruises/bleeds easily (Xarelto).  All other systems reviewed and are negative.    Physical Exam Updated Vital Signs BP (!) 141/99 (BP Location: Left Arm)   Pulse (!) 117   Temp 98.3 F (36.8 C) (Axillary)   Resp (!) 37   Ht 5\' 6"  (1.676 m)   Wt (!) 140.6 kg (309 lb 15.5 oz)   LMP 02/01/2017 (Approximate) Comment: pt shielded  SpO2 100%   BMI 50.03 kg/m   Physical Exam  Constitutional: She is oriented to person, place, and time. She appears well-developed and well-nourished. No distress.  NAD. Appears anxious. On NRB 6 L   HENT:  Head: Normocephalic and atraumatic.  Right Ear: External ear normal.  Left Ear: External ear normal.  Nose: Nose normal.  Atraumatic. Moist mucous membranes   Eyes: Conjunctivae and EOM are normal. No scleral icterus.  Neck: Normal range of motion. Neck supple.  Cardiovascular: Normal rate, regular rhythm and normal heart sounds.  No murmur heard. Tachycardic and at 120-130. No lower extremity edema or calf tenderness.  Pulmonary/Chest: Tachypnea noted. She has decreased breath sounds in the left middle field and the left lower field. She has wheezes in the right middle field and the right lower field.  On nonrebreather mask 6 L with oxygen saturation 94-97%. Mild increased work of breathing. Tachypnic 30-35. Inspiratory wheezing to right middle and lower lobes. Decreased breath sounds to left middle and lower lobes. No crackles.    Abdominal: Soft. There  is no tenderness.  Musculoskeletal: Normal range of motion. She exhibits no deformity.  Neurological: She is alert and oriented to person, place, and time.  Skin: Skin is warm and dry. Capillary refill takes less than 2 seconds.  Psychiatric: She has a normal mood and affect. Her behavior is normal. Judgment and thought content normal.  Nursing note and vitals reviewed.    ED Treatments / Results  Labs (all labs ordered are listed, but only abnormal results are displayed) Labs Reviewed  URINALYSIS, ROUTINE W REFLEX MICROSCOPIC - Abnormal; Notable for the following components:      Result Value   APPearance HAZY (*)    Specific Gravity, Urine 1.035 (*)    Hgb urine dipstick MODERATE (*)    Ketones, ur 5 (*)    Protein, ur 30 (*)    Bacteria, UA RARE (*)  Squamous Epithelial / LPF 0-5 (*)    All other components within normal limits  BRAIN NATRIURETIC PEPTIDE - Abnormal; Notable for the following components:   B Natriuretic Peptide 216.5 (*)    All other components within normal limits  CBC WITH DIFFERENTIAL/PLATELET - Abnormal; Notable for the following components:   WBC 15.2 (*)    Hemoglobin 11.0 (*)    Neutro Abs 11.7 (*)    Eosinophils Absolute 0.9 (*)    All other components within normal limits  BASIC METABOLIC PANEL - Abnormal; Notable for the following components:   CO2 19 (*)    Glucose, Bld 106 (*)    All other components within normal limits  BASIC METABOLIC PANEL - Abnormal; Notable for the following components:   Chloride 113 (*)    CO2 20 (*)    Calcium 8.6 (*)    All other components within normal limits  CBC - Abnormal; Notable for the following components:   WBC 13.8 (*)    RBC 3.49 (*)    Hemoglobin 9.7 (*)    HCT 31.9 (*)    RDW 15.6 (*)    Platelets 125 (*)    All other components within normal limits  BLOOD GAS, ARTERIAL - Abnormal; Notable for the following components:   Acid-base deficit 4.2 (*)    All other components within normal limits    I-STAT ARTERIAL BLOOD GAS, ED - Abnormal; Notable for the following components:   pO2, Arterial 181.0 (*)    TCO2 21 (*)    Acid-base deficit 4.0 (*)    All other components within normal limits  I-STAT BETA HCG BLOOD, ED ([facility], [facility], [facility] ONLY) - Abnormal; Notable for the following components:   I-stat hCG, quantitative 9.6 (*)    All other components within normal limits  RESPIRATORY PANEL BY PCR  MRSA PCR SCREENING  CULTURE, BLOOD (ROUTINE X 2)  CULTURE, BLOOD (ROUTINE X 2)  STREP PNEUMONIAE URINARY ANTIGEN  PROCALCITONIN  MYCOPLASMA PNEUMONIAE ANTIBODY, IGM  LEGIONELLA PNEUMOPHILA SEROGP 1 UR AG  I-STAT TROPONIN, ED  I-STAT CG4 LACTIC ACID, ED  POC URINE PREG, ED  I-STAT CG4 LACTIC ACID, ED    EKG  EKG Interpretation  Date/Time:  Sunday February 28 2017 06:02:46 EST Ventricular Rate:  128 PR Interval:    QRS Duration: 76 QT Interval:  300 QTC Calculation: 438 R Axis:   6 Text Interpretation:  Sinus tachycardia No significant change since last tracing Confirmed by Pryor Curia [phone]) on 02/27/2017 6:07:30 AM Also confirmed by Pryor Curia [phone]), editor Philomena Doheny [phone])  on 02/16/2017 8:50:30 AM       Radiology Dg Chest 1 View  Result Date: 03/06/2017 CLINICAL DATA:  37 year old female with shortness of breath. EXAM: CHEST 1 VIEW COMPARISON:  Chest radiograph dated 02/15/2017 FINDINGS: There is a small left pleural effusion with left mid to lower lung airspace density which may represent atelectasis versus infiltrate mild diffuse central vascular prominence may represent mild vascular congestion there is a patchy area of airspace opacity in the right upper lung field most concerning for pneumonia. Clinical correlation is recommended. There is no pneumothorax. Stable cardiac silhouette. No acute osseous pathology. IMPRESSION: 1. Left-sided pleural effusion and left lower lobe atelectasis versus infiltrate. 2. Patchy right upper lobe opacity concerning for pneumonia.  Clinical correlation is recommended. 3. Probable mild vascular congestion and mild edema. 4. No pneumothorax. Electronically Signed   By: Anner Crete M.D.   On: 03/05/2017 06:59   Ct  Angio Chest Pe W And/or Wo Contrast  Result Date: 03/10/2017 CLINICAL DATA:  37 year old female with recent history of pulmonary embolism in December 2018. Shortness of breath. EXAM: CT ANGIOGRAPHY CHEST WITH CONTRAST TECHNIQUE: Multidetector CT imaging of the chest was performed using the standard protocol during bolus administration of intravenous contrast. Multiplanar CT image reconstructions and MIPs were obtained to evaluate the vascular anatomy. CONTRAST:  139mL ISOVUE-370 IOPAMIDOL (ISOVUE-370) INJECTION 76% COMPARISON:  Chest CT 01/17/2017. FINDINGS: Comment: Today's study is limited by considerable patient respiratory motion. Cardiovascular: No central, lobar or segmental sized filling defects are noted in the pulmonary arterial tree to suggest clinically relevant pulmonary embolism. Smaller subsegmental size filling defects cannot be entirely excluded secondary to respiratory motion. Heart size is borderline enlarged. There is no significant pericardial fluid, thickening or pericardial calcification. No significant aortic atherosclerosis. No coronary artery calcifications. Mediastinum/Nodes: Multiple borderline enlarged and mildly enlarged mediastinal and bilateral hilar lymph nodes, largest of which is in the left hilar region measuring 1.7 cm in short axis. Esophagus is unremarkable in appearance. No axillary lymphadenopathy. Lungs/Pleura: Persistent complete consolidation of the left lower lobe. Abrupt cut off of the left lower lobe bronchus noted on axial image 47 of series 5. Small to moderate left parapneumonic pleural effusion, increased compared to the prior study, predominantly located in the subpulmonic position. Widespread patchy interstitial and airspace disease scattered throughout the lungs bilaterally,  most confluent in the right upper lobe, significantly worsened compared to the prior examination. Upper Abdomen: Unremarkable. Musculoskeletal: Multiple developing areas of sclerosis throughout the visualized axial and appendicular skeleton, new compared to the prior study from 01/17/2017, concerning for potential metastatic disease. A good example of this is a new 12 mm lesion in the left side of the T11 vertebral body (axial image 79 of series 8). Review of the MIP images confirms the above findings. IMPRESSION: 1. Study was limited by considerable patient respiratory motion. With this limitation in mind there is no evidence of central, lobar or segmental sized pulmonary embolism. 2. Severe multilobar pneumonia. Notably, the patient has complete left lower lobe consolidation persists since 01/17/2017. This is highly unusual, and concerning for probable central obstruction. Left lower lobe bronchus is abruptly cut off on axial image 47 of scan series 5 of today's examination, which could be extrinsic or could be related to an endobronchial lesion. Correlation with bronchoscopy should be considered if not recently performed. New small to moderate parapneumonic pleural effusion, predominantly located sub pulmonic. 3. Interval development of multiple sclerotic osseous lesions. This is highly unusual. The possibility of metastatic disease to the bone should be considered. Electronically Signed   By: Vinnie Langton M.D.   On: 03/09/2017 09:53    Procedures Procedures (including critical care time)  CRITICAL CARE Performed by: Kinnie Feil   Total critical care time: 45 minutes  Critical care time was exclusive of separately billable procedures and treating other patients.  Critical care was necessary to treat or prevent imminent or life-threatening deterioration.  Critical care was time spent personally by me on the following activities: development of treatment plan with patient and/or surrogate  as well as nursing, discussions with consultants, evaluation of patient's response to treatment, examination of patient, obtaining history from patient or surrogate, ordering and performing treatments and interventions, ordering and review of laboratory studies, ordering and review of radiographic studies, pulse oximetry and re-evaluation of patient's condition. Medications Ordered in ED Medications  ketorolac (TORADOL) 15 MG/ML injection 15 mg (15 mg Intravenous Given 02/13/2017  2035)  ferrous sulfate tablet 325 mg (not administered)  vancomycin (VANCOCIN) 1,500 mg in sodium chloride 0.9 % 500 mL IVPB (1,500 mg Intravenous New Bag/Given 03/01/17 0734)  ceFEPIme (MAXIPIME) 2 g in dextrose 5 % 50 mL IVPB (0 g Intravenous Stopped 02/22/2017 2246)  HYDROcodone-homatropine (HYCODAN) 5-1.5 MG/5ML syrup 5 mL (5 mLs Oral Given 02/09/2017 2214)  enoxaparin (LOVENOX) injection 140 mg (140 mg Subcutaneous Given 03/06/2017 2037)  ipratropium-albuterol (DUONEB) 0.5-2.5 (3) MG/3ML nebulizer solution 3 mL (3 mLs Nebulization Given 03/01/17 0433)  ceFEPIme (MAXIPIME) 2 g in dextrose 5 % 50 mL IVPB (0 g Intravenous Stopped 02/09/2017 0833)  sodium chloride 0.9 % bolus 1,000 mL (1,000 mLs Intravenous New Bag/Given 02/21/2017 0721)  vancomycin (VANCOCIN) 2,000 mg in sodium chloride 0.9 % 500 mL IVPB (0 mg Intravenous Stopped 02/09/2017 0955)  ipratropium-albuterol (DUONEB) 0.5-2.5 (3) MG/3ML nebulizer solution 3 mL (3 mLs Nebulization Given 03/06/2017 0735)  iopamidol (ISOVUE-370) 76 % injection (100 mLs  Contrast Given 02/09/2017 0857)  ketorolac (TORADOL) 30 MG/ML injection 30 mg (30 mg Intravenous Given 02/17/2017 0840)  acetaminophen (TYLENOL) tablet 1,000 mg (1,000 mg Oral Given 02/20/2017 0837)  furosemide (LASIX) injection 20 mg (20 mg Intravenous Given 03/01/2017 1247)     Initial Impression / Assessment and Plan / ED Course  I have reviewed the triage vital signs and the nursing notes.  Pertinent labs & imaging results that were available  during my care of the patient were reviewed by me and considered in my medical decision making (see chart for details).  Clinical Course as of Mar 01 804  Theodis Shove  8469 Reevaluated patient after chest x-ray. She is alert and mentating well. Mild increased work of breathing. Normal oxygen saturations on 6 L nonrebreather. Discussed chest x-ray and pending lab work. We'll reassess.  [CG]  0722 WBC: (!) 15.2 [CG]  0727 IMPRESSION: 1. Left-sided pleural effusion and left lower lobe atelectasis versus infiltrate. 2. Patchy right upper lobe opacity concerning for pneumonia. Clinical correlation is recommended. 3. Probable mild vascular congestion and mild edema. 4. No pneumothorax. DG Chest 1 View [CG]  0800 B Natriuretic Peptide: (!) 216.5 [CG]  0811 Re-evaluated pt. States her breathing is "comfortable", still on NRB 6L with normal SpO2. Asking for pain medicine for RLE pain that she has had for several months. On exam she has TTP along right inguinal fold, right anterior thigh. Pain radiates down calf and up into right waist line. There has been documentation of RLE soreness, question DVT. Should consider [place] as inpatient although may not change management as she is already on xarelto. Will give non narcotic analgesia to avoid worsening respiratory compromsie.   [CG]  [phone] RN approched me regarding increased work of breathing on NRB with now belly breathing and pt fatigue. Oxygen saturation dropping to 80% without NRB and up to 100% with 12 L. Will order Bipap. Unable to assess pt, in CT.   [CG]  2841 Reevaluated patient after CT. She is on BiPAP, appears to be tolerating it well. Heart rate in the 110s, still tachypneic in the 30-35 range. Pending CT angina.  [CG]  1027 Appearance: (!) HAZY [CG]  1027 Hgb urine dipstick: (!) MODERATE [CG]  1027 Bacteria, UA: (!) RARE [CG]  1027 Mucus: PRESENT [CG]  1031 1. Study was limited by considerable patient respiratory motion. With this  limitation in mind there is no evidence of central, lobar or segmental sized pulmonary embolism. 2. Severe multilobar pneumonia. Notably, the patient  has complete left lower lobe consolidation persists since 01/17/2017. This is highly unusual, and concerning for probable central obstruction. Left lower lobe bronchus is abruptly cut off on axial image 47 of scan series 5 of today's examination, which could be extrinsic or could be related to an endobronchial lesion. Correlation with bronchoscopy should be considered if not recently performed. New small to moderate parapneumonic pleural effusion, predominantly located sub pulmonic. 3. Interval development of multiple sclerotic osseous lesions. This is highly unusual. The possibility of metastatic disease to the bone should be considered. CT Angio Chest PE W and/or Wo Contrast [CG]  1038 Widespread patchy interstitial and airspace disease scattered throughout the lungs bilaterally, most confluent in the right upper lobe, significantly worsened compared to the prior examination.   CT Angio Chest PE W and/or Wo Contrast [CG]  1231 Re-evaluated pt. VS unchanged. She states she is comfortable. Awaiting bed.   [CG]    Clinical Course User Index [CG] Kinnie Feil, PA-C   37 year old female with history of R PE on xarelto, recurrent pneumonia, chronic cough and recent bronchoscopy here for shortness of breath since yesterday.  No CP, back pain.   On exam, she is febrile, tachycardic, tachypneic and requiring NRB. Mild increased work of breathing. Diffuse decreased breath sounds to left middle/lower lobe, and faint wheezing to the right lower lobe.   0730: CXR concerning for left effusion, vascular edema vs infiltrate and right upper lobe PNA. She has right sided PE, question infarct. Will obtain CTA. Pt shared with Dr. Leonides Schanz.  Final Clinical Impressions(s) / ED Diagnoses   CT angiogram negative for PE. There is severe multilobar pneumonia. Known,  unresolving left lower lobe consolidation noted. Confluent patchy airway disease in RUL, worsened from previous. Also noted new sclerotic bony leasions, raising suspicion for metastatic disease. Biopsy results on 1/16 reviewed, with adenocarcinoma of lung. Patient is unaware of biopsy results and I did not update patient, this was shared with admitting team. Requested admission for hypoxia  on BiPAP in setting of HAP and adenocarcinoma of lung. Vanc, cefepime, IVF, toradol/tylenol, lasix given in ED. U/A with mucus, rare bacteria and moderate hgb, will send for culture.  Final diagnoses:  Pleural effusion    ED Discharge Orders    None        Kinnie Feil, PA-C 02/17/2017 1111    Kinnie Feil, PA-C 03/01/17 0786    Ward, Delice Bison, DO 03/03/17 0000

## 2017-02-28 NOTE — Progress Notes (Addendum)
Pharmacy Antibiotic Note  Crystal Gallagher is a 37 y.o. female admitted on 03/05/2017 with pneumonia.  Pharmacy has been consulted for Vancomycin dosing. CXR concerning for pna. WBC elevated at 15.2. LA 1.06. Patient received loading doses of vancomycin and Cefepime in the ED. nCrCl ~ 111 mL/min.   Plan: -Start Vancomycin 1500 mg IV Q 8 hours x 8 days -F/u maintenance cefepime dosing  -Monitor CBC, renal fx, cultures and clinical progress -VT at SS     Temp (24hrs), Avg:100 F (37.8 C), Min:99.6 F (37.6 C), Max:100.4 F (38 C)  Recent Labs  Lab 02/23/17 1731 02/25/17 0148 02/21/2017 0618 02/14/2017 0712  WBC 15.5* 13.5* 15.2*  --   CREATININE 0.66  --  0.78  --   LATICACIDVEN  --   --   --  1.52    Estimated Creatinine Clearance: 139.1 mL/min (by C-G formula based on SCr of 0.78 mg/dL).    No Known Allergies  Antimicrobials this admission: Vanc 1/20 >>  Cefepime 1/20 >> ??  Dose adjustments this admission: None   Microbiology results: 1/20 BCx:    Thank you for allowing pharmacy to be a part of this patient's care.  Albertina Parr, PharmD., BCPS Clinical Pharmacist Pager [phone]  Addendum: Pharmacy now consulted to start cefepime maintenance dosing. Will order Cefepime 2 gm IV Q 8 hours.  Albertina Parr, PharmD., BCPS Clinical Pharmacist Pager [phone]

## 2017-02-28 NOTE — H&P (Signed)
Date: 02/10/2017               Patient Name:  Crystal Gallagher MRN: [id number]  DOB: 06/30/1980 Age / Sex: 37 y.o., female   PCP: Forrest Moron, MD         Medical Service: Internal Medicine Teaching Service         Attending Physician: Dr. Aldine Contes, MD    First Contact: Dr. Thomasene Ripple Pager: [phone]  Second Contact: Dr. Ledell Noss Pager: [phone]       After Hours (After 5p/  First Contact Pager: [phone]  weekends / holidays): Second Contact Pager: [phone]   Chief Complaint: Shortness of breath, cough  History of Present Illness:  Crystal Gallagher is a 36 yo with PMH of persistent LLL pneumonia and PE who presents for evaluation of progressively worsening shortness of breath. Patient states that she first noticed her symptoms after leaving the hospital after a recent procedure. Per chart review the patient was hospitalized at [facility] for a bronchoscopy that was completed on 02/24/2017 with Dr. Lamonte Sakai. Since that procedure the patient has noticed progressively worsening shortness of breath with exertion. She states that over the past few days it has progressed to the point were she cannot even take 2 steps without feeling tired. She also endorses intermittent cough with rust-colored sputum, which has occurred since her brochoscopy. Patient notes intermittent fevers and chills over the past few days. Denies chest pain, nausea, vomiting, bilateral lower extremity swelling, dysuria, increased urinary frequency, and change in bowel movements.  Of note, in the past few months leading up to the current presentation the patient has experienced productive cough with clear sputum, intermittent night sweats, and shortness of breath. She has been diagnosed and treated for LLL pneumonia on multiple occasions which has been refractory to treatment. She recently underwent bronchoscopy to further workup recurrent/refractory LLL pneumonia. Per notes from 02/24/2017 procedure, an area of  erythema, edema was observed in the L lower lobe. A non-obstructing and friable submucosal lesion was identified at the LUL carina that extended into LLL. This endobronchial mass did not obstruct the airway and was biopsied during the procedure. 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 The results have not been returned to the patient when she presented to the ED.  In the ED she was found to be febrile to 38.0C, tachycardic into the 110s-120s, normotensive and hypoxic to 86% on room air. The patient was started on non-rebreather mask for tachypnea and hypoxia upon arrival to ED, but was eventually transitioned to BiPAP, as she had continued increased work of breathing on non-rebreather mask. She had a leukocytosis to 15.2 and Hgb of 11 (around historical baseline). Her lactate was 1.52 and bicarb decreased to 19, with anion gap of 14. Her ABG had pH 7.385 with pCO2 of 34.5. Urinalysis negative for LE, Nitrates. Chest Xray revealed RUL opacity concerning for pneumonia, with L pleural effusion and LLL atelectasis/infiltrate. Both of these abnormalities were observed on CT scan along with hilar lymphadenopathy and multiple sclerotic lesions in the thoracic spine that were new since 01/17/2017 (last imaging study) and were concerning for metastatic disease. Given evidence of multilobar pneumonia and abnormal vital signs with new oxygen requirement, the patient met sepsis criteria and was started on vancomycin/cefepime. Blood cultures were also obtained and IMTS was called for admission.  Meds:  Current Meds  Medication Sig  . acetaminophen (TYLENOL) 500 MG tablet  Take 1 tablet (500 mg total) by mouth every 6 (six) hours as needed for moderate pain.  Marland Kitchen albuterol (PROVENTIL HFA;VENTOLIN HFA) 108 (90 Base) MCG/ACT inhaler Inhale 1-2 puffs into the lungs every 6 (six) hours as needed for wheezing.  . ferrous sulfate (FERROUSUL) 325 (65 FE)  MG tablet Take 1 tablet (325 mg total) by mouth daily with breakfast.  . HYDROcodone-homatropine (HYCODAN) 5-1.5 MG/5ML syrup Take 5 mLs by mouth every 6 (six) hours as needed for cough.  . norethindrone (AYGESTIN) 5 MG tablet Take 2 tablets (10 mg total) by mouth daily.  . rivaroxaban (XARELTO) 20 MG TABS tablet Take 1 tablet (20 mg total) by mouth daily with supper.   Allergies: Allergies as of 02/16/2017  . (No Known Allergies)   Past Medical History: Past Medical History:  Diagnosis Date  . Asthma   . 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 Pneumonia   . Pulmonary embolism [facility])    Past Surgical History: Past Surgical History:  Procedure Laterality Date  . APPENDECTOMY    . BREAST SURGERY  breast reduction  . VIDEO BRONCHOSCOPY Bilateral 02/24/2017   Procedure: VIDEO BRONCHOSCOPY WITH FLUORO;  Surgeon: Collene Gobble, MD;  Location: [facility];  Service: Cardiopulmonary;  Laterality: Bilateral;   Family History:  Family History  Problem Relation Age of Onset  . Cancer Other   . Hypertension Other   . Arthritis Mother   . Colon cancer Father   . Breast cancer Maternal Aunt   . Colon cancer Paternal Uncle   . Stroke Maternal Grandmother   . Cancer Maternal Grandfather   . Hypertension Maternal Grandfather   . Alzheimer's disease Paternal Grandmother   . Breast cancer Maternal Aunt    Social History:  Lives with mother. Previously worked at [employer] but on short term disability given her recent medical illnesses. Patient denies cigarette use and illicit drug use. Drinks on occasion, stating she really only drinks 1-2 drinks per year.  Review of Systems: A complete ROS was negative except as per HPI.   Physical Exam: Blood pressure 123/84, pulse (!) 110, temperature (!) 100.4 F (38 C), temperature source Rectal, resp. rate (!) 31, height _0  (1.676 m), weight (!) 310 lb (140.6 kg), last menstrual period 02/01/2017, SpO2 97 %.  Physical Exam  Constitutional:    Obese woman who appears stated age sitting comfortably in bed on BiPAP mask in no acute distress.  HENT:  Mouth/Throat: Oropharynx is clear and moist.  Cardiovascular:  Tachycardic. Regular rhythm. No murmurs appreciated. 2+ radial pulses, posterior tibial bilaterally.  Respiratory:  Patient intermittently takes of BiPAP mask to facilitate interview. Off mask patient appears tachypneic with mild nasal flaring, accessory muscle use. Difficulty completing full sentences during exam. Appears comfortably on BiPAP. Decreased breath sounds in L lung fields compared to R, with almost complete loss of breath sounds at L lung base. Intermittent wheezing throughout. No crackles appreciated.  GI: Soft. Bowel sounds are normal. She exhibits no distension. There is no tenderness.  Musculoskeletal: She exhibits no edema (of bilateral lower extremities) or tenderness (of bilateral lower extremities).  No tenderness with palpation of cervical, thoracic, lumbar spine.  Lymphadenopathy:    She has no cervical adenopathy.  Skin: Skin is warm and dry. No rash noted. No erythema.  Psychiatric: She has a normal mood and affect. Her behavior is normal.   EKG: personally reviewed my interpretation is sinus tachycardia, without signs of ST elevation to suggest ischemia. Normal  axis. No LVH.   CXR: personally reviewed my interpretation is low lung volumes. Patchy infiltrates throughout, R >L. Likely L pleural effusion.   Assessment & Plan by Problem: Active Problems:   Sepsis ([place])  Benedicta Sultan is a 37 yo with PMH of persistent LLL pneumonia and PE who presented for evaluation of progressively worsening shortness of breath. In the ED the patient met sepsis criteria given tachycardia, fever, leukocytosis, hypoxia and evidence of multifocal pneumonia on imaging. She was started on broad spectrum antibiotics in the ED and admitted internal medicine teaching service for management with pulmonology consulting. The  specific problems addressed during admission were as followed:  Acute respiratory failure with hypoxia 2/2 multifocal pneumonia, sepsis: Patient's imaging consistent with multifocal pneumonia in the setting of recent bronchoscopy with biopsy of endobrachial/LLL mass (see below). Patient hypoxic in the ED with increased respiratory effort, therefore placed on BiPAP in ED. Will continue efforts to wean to room air as she is undergoing treatment for her infection with broad spectrum antibiotics given recent procedure. Patient also has L pleural effusion, which may be associated with pneumonia or malignant given recent diagnosis of poorly differentiated adenocarcinoma. Once her respiratory status improves, patient may benefit from thoracentesis and testing of fluid. 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 Patient's bronchoscopy revealed poorly differentiated adenocarcinoma which has yet to be revealed to the patient. There has been interval development of thoracic spine lesions per CT imaging obtained in ED. Patient will likely need workup of primary lung cancer, including PET imaging and/or possible biopsy of suspected vertebral lesions/hilar lymphadenopathy. Per chart review patient has lost about 20 lbs since first developing LLL pneumonia in 11/2017. Patient reports intermittent back pain which occurs with coughing, which is concerning given new lesions observed on imaging. Patient does not have tenderness with palpation of  vertebral bodies, which is reassuring.  -Primary pulmonologist Dr Lamonte Sakai contacted, will also consult [facility] pulmonolgy and oncology for their recommendations -Tordal 15 mg q8 hours for back pain  Hx of PE on 01/17/2017: Patient had CT angiography in the ED which did not reveal any signs of recurrent PE or R heart strain, which was observed when diagnosed with R pulmonary artery PE in 01/2017. Patient has been compliant with daily Xarelto for treatment of PE. Patient will be transitioned to Lovenox for therapy in case procedure needed while inpatient. -Lovenox 1.0 mg/kg BID for treatment of PE  FEN/GI: -Regular diet once off BiPAP -No IVF, replace electrolytes as needed  VTE Prophylaxis: Lovenox  Code Status: Full  Dispo: Admit patient to Inpatient with expected length of stay greater than 2 midnights.  SignedThomasene Ripple, MD 02/16/2017, 11:50 AM  Pager: [phone]

## 2017-02-28 NOTE — Progress Notes (Signed)
ANTICOAGULATION CONSULT NOTE - Initial Consult  Pharmacy Consult for lovenox  Indication: history of PE on xarelto PTA   No Known Allergies  Patient Measurements: Height: 5\' 6"  (167.6 cm) Weight: (!) 310 lb (140.6 kg) IBW/kg (Calculated) : 59.3   Vital Signs: Temp: 100.4 F (38 C) (01/20 0646) Temp Source: Rectal (01/20 0646) BP: 134/72 (01/20 1245) Pulse Rate: 110 (01/20 1245)  Labs: Recent Labs    03/09/2017 0618  HGB 11.0*  HCT 36.0  PLT 163  CREATININE 0.78    Estimated Creatinine Clearance: 138.2 mL/min (by C-G formula based on SCr of 0.78 mg/dL).   Medical History: Past Medical History:  Diagnosis Date  . Asthma   . 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 Pneumonia   . Pulmonary embolism [facility])      Assessment: 37 y.o. female with a history of right PE on xarelto prior to admission, last dose on 1/19 at 1630. Hemoglobin is 11.0, platelets down from recent last admission to 163.   Goal of Therapy:  Monitor platelets by anticoagulation protocol: Yes   Plan:  Lovenox 140mg  SQ BID  Check anti-Xa level 6 hours after first dose    Jalene Mullet, Pharm.D. PGY1 Pharmacy Resident 02/13/2017 1:40 PM Main Pharmacy: [phone]

## 2017-02-28 NOTE — ED Triage Notes (Addendum)
Pt. From home via EMS for difficulty breathing since yesterday. Pt recently diagnosed with collapsed lung on left side. Pt. Went for biopsy on Friday.  SPO2 86% on RA.  Given 5 albuterol mg en route. Pt. Reports small amount of blood when coughing.

## 2017-02-28 NOTE — ED Notes (Signed)
Patient out to CT, Patient placed back on NRB.

## 2017-03-01 ENCOUNTER — Inpatient Hospital Stay (HOSPITAL_COMMUNITY): Payer: 59

## 2017-03-01 DIAGNOSIS — J9601 Acute respiratory failure with hypoxia: Secondary | ICD-10-CM

## 2017-03-01 DIAGNOSIS — C7951 Secondary malignant neoplasm of bone: Secondary | ICD-10-CM

## 2017-03-01 DIAGNOSIS — J189 Pneumonia, unspecified organism: Secondary | ICD-10-CM

## 2017-03-01 DIAGNOSIS — R918 Other nonspecific abnormal finding of lung field: Secondary | ICD-10-CM

## 2017-03-01 DIAGNOSIS — C3432 Malignant neoplasm of lower lobe, left bronchus or lung: Principal | ICD-10-CM

## 2017-03-01 DIAGNOSIS — C3490 Malignant neoplasm of unspecified part of unspecified bronchus or lung: Secondary | ICD-10-CM

## 2017-03-01 DIAGNOSIS — R590 Localized enlarged lymph nodes: Secondary | ICD-10-CM

## 2017-03-01 DIAGNOSIS — G9589 Other specified diseases of spinal cord: Secondary | ICD-10-CM

## 2017-03-01 DIAGNOSIS — Z803 Family history of malignant neoplasm of breast: Secondary | ICD-10-CM

## 2017-03-01 DIAGNOSIS — Z801 Family history of malignant neoplasm of trachea, bronchus and lung: Secondary | ICD-10-CM

## 2017-03-01 DIAGNOSIS — I2699 Other pulmonary embolism without acute cor pulmonale: Secondary | ICD-10-CM

## 2017-03-01 DIAGNOSIS — T17908S Unspecified foreign body in respiratory tract, part unspecified causing other injury, sequela: Secondary | ICD-10-CM

## 2017-03-01 DIAGNOSIS — R0603 Acute respiratory distress: Secondary | ICD-10-CM

## 2017-03-01 DIAGNOSIS — Z7901 Long term (current) use of anticoagulants: Secondary | ICD-10-CM

## 2017-03-01 DIAGNOSIS — C7801 Secondary malignant neoplasm of right lung: Secondary | ICD-10-CM

## 2017-03-01 DIAGNOSIS — J45909 Unspecified asthma, uncomplicated: Secondary | ICD-10-CM

## 2017-03-01 DIAGNOSIS — A419 Sepsis, unspecified organism: Secondary | ICD-10-CM

## 2017-03-01 DIAGNOSIS — C3492 Malignant neoplasm of unspecified part of left bronchus or lung: Secondary | ICD-10-CM

## 2017-03-01 DIAGNOSIS — J9819 Other pulmonary collapse: Secondary | ICD-10-CM

## 2017-03-01 LAB — CBC
HEMATOCRIT: 31.9 % — AB (ref 36.0–46.0)
Hemoglobin: 9.7 g/dL — ABNORMAL LOW (ref 12.0–15.0)
MCH: 27.8 pg (ref 26.0–34.0)
MCHC: 30.4 g/dL (ref 30.0–36.0)
MCV: 91.4 fL (ref 78.0–100.0)
Platelets: 125 10*3/uL — ABNORMAL LOW (ref 150–400)
RBC: 3.49 MIL/uL — ABNORMAL LOW (ref 3.87–5.11)
RDW: 15.6 % — AB (ref 11.5–15.5)
WBC: 13.8 10*3/uL — ABNORMAL HIGH (ref 4.0–10.5)

## 2017-03-01 LAB — BLOOD GAS, ARTERIAL
ACID-BASE DEFICIT: 6.3 mmol/L — AB (ref 0.0–2.0)
Acid-base deficit: 4.2 mmol/L — ABNORMAL HIGH (ref 0.0–2.0)
Bicarbonate: 20.2 mmol/L (ref 20.0–28.0)
Bicarbonate: 20.4 mmol/L (ref 20.0–28.0)
DRAWN BY: 511551
Delivery systems: POSITIVE
Drawn by: 51185
Expiratory PAP: 6
FIO2: 40
FIO2: 100
Inspiratory PAP: 12
LHR: 8 {breaths}/min
MECHVT: 410 mL
O2 SAT: 95.9 %
O2 SAT: 98.5 %
PEEP/CPAP: 5 cmH2O
PH ART: 7.204 — AB (ref 7.350–7.450)
PO2 ART: 87.8 mmHg (ref 83.0–108.0)
Patient temperature: 99.5
Patient temperature: 98.6
RATE: 30 resp/min
pCO2 arterial: 36.5 mmHg (ref 32.0–48.0)
pCO2 arterial: 53.7 mmHg — ABNORMAL HIGH (ref 32.0–48.0)
pH, Arterial: 7.364 (ref 7.350–7.450)
pO2, Arterial: 150 mmHg — ABNORMAL HIGH (ref 83.0–108.0)

## 2017-03-01 LAB — PROCALCITONIN: Procalcitonin: 0.4 ng/mL

## 2017-03-01 LAB — BASIC METABOLIC PANEL
Anion gap: 11 (ref 5–15)
BUN: 12 mg/dL (ref 6–20)
CHLORIDE: 113 mmol/L — AB (ref 101–111)
CO2: 20 mmol/L — AB (ref 22–32)
Calcium: 8.6 mg/dL — ABNORMAL LOW (ref 8.9–10.3)
Creatinine, Ser: 0.65 mg/dL (ref 0.44–1.00)
GFR calc Af Amer: >60 mL/min (ref >60)
GFR calc non Af Amer: >60 mL/min (ref >60)
GLUCOSE: 88 mg/dL (ref 65–99)
POTASSIUM: 3.9 mmol/L (ref 3.5–5.1)
Sodium: 144 mmol/L (ref 135–145)

## 2017-03-01 LAB — TRIGLYCERIDES
TRIGLYCERIDES: 219 mg/dL — AB (ref <150)
Triglycerides: 268 mg/dL — ABNORMAL HIGH (ref <150)

## 2017-03-01 LAB — MRSA PCR SCREENING: MRSA BY PCR: NEGATIVE

## 2017-03-01 MED ORDER — PROPOFOL 1000 MG/100ML IV EMUL
0.0000 ug/kg/min | INTRAVENOUS | Status: DC
  Filled 2017-03-01: qty 100

## 2017-03-01 MED ORDER — FENTANYL BOLUS VIA INFUSION
50.0000 ug | INTRAVENOUS | Status: DC | PRN
  Administered 2017-03-02 – 2017-03-06 (×7): 50 ug via INTRAVENOUS
  Filled 2017-03-01: qty 50

## 2017-03-01 MED ORDER — SODIUM CHLORIDE 0.9 % IV BOLUS (SEPSIS)
500.0000 mL | Freq: Once | INTRAVENOUS | Status: AC
  Administered 2017-03-01: 500 mL via INTRAVENOUS

## 2017-03-01 MED ORDER — IPRATROPIUM-ALBUTEROL 0.5-2.5 (3) MG/3ML IN SOLN
3.0000 mL | RESPIRATORY_TRACT | Status: DC | PRN
  Administered 2017-03-01 (×2): 3 mL via RESPIRATORY_TRACT
  Filled 2017-03-01: qty 3

## 2017-03-01 MED ORDER — MIDAZOLAM HCL 2 MG/2ML IJ SOLN
INTRAMUSCULAR | Status: AC
  Administered 2017-03-01: 2 mg
  Filled 2017-03-01: qty 4

## 2017-03-01 MED ORDER — ETOMIDATE 2 MG/ML IV SOLN
16.5000 mg | INTRAVENOUS | Status: AC | PRN
  Administered 2017-03-01: 20 mg via INTRAVENOUS
  Filled 2017-03-01 (×2): qty 18

## 2017-03-01 MED ORDER — FENTANYL CITRATE (PF) 100 MCG/2ML IJ SOLN
INTRAMUSCULAR | Status: AC
  Filled 2017-03-01: qty 2

## 2017-03-01 MED ORDER — CHLORHEXIDINE GLUCONATE 0.12% ORAL RINSE (MEDLINE KIT)
15.0000 mL | Freq: Two times a day (BID) | OROMUCOSAL | Status: DC
  Administered 2017-03-01 – 2017-03-08 (×14): 15 mL via OROMUCOSAL

## 2017-03-01 MED ORDER — PROPOFOL 1000 MG/100ML IV EMUL
5.0000 ug/kg/min | INTRAVENOUS | Status: DC
  Administered 2017-03-01: 20 ug/kg/min via INTRAVENOUS
  Filled 2017-03-01: qty 100

## 2017-03-01 MED ORDER — ORAL CARE MOUTH RINSE
15.0000 mL | Freq: Four times a day (QID) | OROMUCOSAL | Status: DC
  Administered 2017-03-01 – 2017-03-08 (×26): 15 mL via OROMUCOSAL

## 2017-03-01 MED ORDER — PANTOPRAZOLE SODIUM 40 MG IV SOLR
40.0000 mg | Freq: Every day | INTRAVENOUS | Status: DC
  Administered 2017-03-01 – 2017-03-04 (×4): 40 mg via INTRAVENOUS
  Filled 2017-03-01 (×5): qty 40

## 2017-03-01 MED ORDER — FENTANYL 2500MCG IN NS 250ML (10MCG/ML) PREMIX INFUSION
0.0000 ug/h | INTRAVENOUS | Status: DC
  Administered 2017-03-01: 250 ug/h via INTRAVENOUS
  Administered 2017-03-01: 100 ug/h via INTRAVENOUS
  Administered 2017-03-01: 50 ug/h via INTRAVENOUS
  Administered 2017-03-01: 150 ug/h via INTRAVENOUS
  Filled 2017-03-01: qty 250

## 2017-03-01 MED ORDER — FENTANYL CITRATE (PF) 100 MCG/2ML IJ SOLN
100.0000 ug | INTRAMUSCULAR | Status: DC | PRN
  Administered 2017-03-02 – 2017-03-04 (×5): 100 ug via INTRAVENOUS

## 2017-03-01 MED ORDER — PROPOFOL 1000 MG/100ML IV EMUL
5.0000 ug/kg/min | INTRAVENOUS | Status: DC
Start: 2017-03-01 — End: 2017-03-04
  Administered 2017-03-01: 60 ug/kg/min via INTRAVENOUS
  Administered 2017-03-01: 80 ug/kg/min via INTRAVENOUS
  Administered 2017-03-01: 60 ug/kg/min via INTRAVENOUS
  Administered 2017-03-01: 90 ug/kg/min via INTRAVENOUS
  Administered 2017-03-01: 65 ug/kg/min via INTRAVENOUS
  Administered 2017-03-01: 80 ug/kg/min via INTRAVENOUS
  Administered 2017-03-01: 60 ug/kg/min via INTRAVENOUS
  Administered 2017-03-02: 50 ug/kg/min via INTRAVENOUS
  Administered 2017-03-02: 40 ug/kg/min via INTRAVENOUS
  Administered 2017-03-02: 50 ug/kg/min via INTRAVENOUS
  Administered 2017-03-02: 60 ug/kg/min via INTRAVENOUS
  Administered 2017-03-02 (×4): 50 ug/kg/min via INTRAVENOUS
  Administered 2017-03-03: 30 ug/kg/min via INTRAVENOUS
  Administered 2017-03-03: 45 ug/kg/min via INTRAVENOUS
  Administered 2017-03-03 (×2): 50 ug/kg/min via INTRAVENOUS
  Administered 2017-03-04: 40 ug/kg/min via INTRAVENOUS
  Filled 2017-03-01: qty 100
  Filled 2017-03-01: qty 200
  Filled 2017-03-01 (×24): qty 100

## 2017-03-01 MED ORDER — HEPARIN (PORCINE) IN NACL 100-0.45 UNIT/ML-% IJ SOLN
1900.0000 [IU]/h | INTRAMUSCULAR | Status: DC
  Administered 2017-03-01: 1900 [IU]/h via INTRAVENOUS
  Filled 2017-03-01: qty 250

## 2017-03-01 MED ORDER — VANCOMYCIN HCL 10 G IV SOLR
1500.0000 mg | Freq: Three times a day (TID) | INTRAVENOUS | Status: DC
  Administered 2017-03-01 – 2017-03-02 (×2): 1500 mg via INTRAVENOUS
  Filled 2017-03-01 (×4): qty 1500

## 2017-03-01 MED ORDER — FENTANYL CITRATE (PF) 100 MCG/2ML IJ SOLN
INTRAMUSCULAR | Status: AC
  Administered 2017-03-01: 100 ug
  Filled 2017-03-01: qty 2

## 2017-03-01 MED ORDER — MIDAZOLAM HCL 2 MG/2ML IJ SOLN
1.0000 mg | INTRAMUSCULAR | Status: DC | PRN
  Administered 2017-03-01 – 2017-03-06 (×12): 2 mg via INTRAVENOUS
  Filled 2017-03-01 (×12): qty 2

## 2017-03-01 MED ORDER — SODIUM CHLORIDE 0.9 % IV SOLN
25.0000 ug/h | INTRAVENOUS | Status: DC
  Filled 2017-03-01: qty 50

## 2017-03-01 MED ORDER — IPRATROPIUM-ALBUTEROL 0.5-2.5 (3) MG/3ML IN SOLN: 3.0000 mL | RESPIRATORY_TRACT | Status: DC

## 2017-03-01 MED ORDER — FENTANYL CITRATE (PF) 100 MCG/2ML IJ SOLN
50.0000 ug | Freq: Once | INTRAMUSCULAR | Status: AC
  Administered 2017-03-01: 100 ug via INTRAVENOUS

## 2017-03-01 MED ORDER — FENTANYL CITRATE (PF) 100 MCG/2ML IJ SOLN
100.0000 ug | INTRAMUSCULAR | Status: DC | PRN
  Administered 2017-03-02: 100 ug via INTRAVENOUS

## 2017-03-01 MED ORDER — ROCURONIUM BROMIDE 50 MG/5ML IV SOLN
55.0000 mg | INTRAVENOUS | Status: AC | PRN
  Administered 2017-03-01: 50 mg via INTRAVENOUS
  Filled 2017-03-01 (×2): qty 12

## 2017-03-01 MED ORDER — SODIUM CHLORIDE 0.9 % IV SOLN
0.0000 ug/h | INTRAVENOUS | Status: DC
  Administered 2017-03-01 – 2017-03-02 (×2): 175 ug/h via INTRAVENOUS
  Administered 2017-03-03: 200 ug/h via INTRAVENOUS
  Administered 2017-03-04: 100 ug/h via INTRAVENOUS
  Administered 2017-03-05: 325 ug/h via INTRAVENOUS
  Administered 2017-03-05 – 2017-03-07 (×4): 400 ug/h via INTRAVENOUS
  Administered 2017-03-07 (×2): 375 ug/h via INTRAVENOUS
  Administered 2017-03-07: 400 ug/h via INTRAVENOUS
  Filled 2017-03-01 (×17): qty 50

## 2017-03-01 NOTE — Progress Notes (Signed)
Subjective:  Patient seen sitting in bed this AM, looking uncomfortable, still on BiPAP. Patient had increased work of breathing still on BiPAP. Patient was informed that critical care doctors will need to intubate her given her inability to be weaned off BiPAP. Patient's mother bedside and expressed understanding with plan. Patient tearful but understands why she needs to be intubated and OK with plan.  Objective:  Vital signs in last 24 hours: Vitals:   03/01/17 0433 03/01/17 0600 03/01/17 0735 03/01/17 0832  BP:    (!) 147/85  Pulse:  (!) 114 (!) 117 (!) 116  Resp:  (!) 28 (!) 37 (!) 31  Temp:    99.5 F (37.5 C)  TempSrc:    Axillary  SpO2: 96% 96% 100% 92%  Weight:      Height:       Physical Exam  Constitutional:  Patient sitting in bed, looking uncomfortable on BiPAP mask. Not diaphoretic.  Cardiovascular:  Tachycardic. No murmurs appreciated.  Respiratory:  Patient appeared to have belly breathing on BiPAP with some accessory muscle use and tachypnea. NO overt cyanosis. Decreased breath sounds throughout L lung fields, worse at base than at apex. No wheezing or crackles appreciated on R.  GI: Soft. Bowel sounds are normal. She exhibits no distension. There is no tenderness. There is no rebound.  Skin: Skin is warm and dry. No rash noted. No erythema.   Assessment/Plan:  Active Problems:   Sepsis ([place])  Crystal Gallagher is a 37 yo with PMH of persistent LLL pneumonia and PE who presented for evaluation of progressively worsening shortness of breath. In the ED the patient met sepsis criteria given tachycardia, fever, leukocytosis, hypoxia and evidence of multifocal pneumonia on imaging. She was started on broad spectrum antibiotics in the ED and admitted internal medicine teaching service for management with critical care consulting. The specific problems addressed during admission were as followed:  Acute respiratory failure with hypoxia 2/2 multifocal pneumonia, LLL  collapse: Overnight the patient failed to be weaned off BiPAP in spite of broad spectrum antibiotics. At this point acute respiratory failure more likely 2/2 LLL collapse as a result of newly diagnosed lung adenocarcinoma as outlined below. Since patient more uncomfortable on BiPAP with continued hypoxia, patient will need intubation and emergent treatment of obstructing lesion as outlined below. PCCM consulted and already seen patient today. They are preparing for intubation and transfer to [facility] ICU.  -PCCM consulted and appreciated -Continue vancomycin, cefepime -F/u blood x2 and urine culture in ED -RVP negative, urine strep antigen negative -Procalcitonin 0.34 -Pending tests: mycoplasma, legionella -Intubation pending  Primary lung adenocarcinoma obstructing LLL with lung collapse & new L hilar lymphadenopathy, thoracic spine lesions: Patient recently underwent bronchoscopy for tissue biopsy of endobronchial lesion, which showed poorly differentiated adenocarcinoma. Imaging on admission showed LLL collapse 2/2 bronchial obstruction, for which patient will need emergent radiation while intubated. Patient also has questionable new hilar lymphadenopathy and thoracic spine lesions. -Transfer to [facility] for emergent radiation, oncology aware of patient  Hx of PE on 01/17/2017: Patient on Xarelto as outpatient. Been giving lovenox here to treat with plan to transition to heparin bridge upon transfer. -Lovenox 1.0 mg/kg BID, last dose this AM -Heparin gtt to be started around 8 PM given last dose of lovenox (after transfer)  FEN/GI: -NPO while on BiPAP with impending intubation -No IVF, replace electrolytes as needed  VTE Prophylaxis: Lovenox, heparin while intubated Code Status: Full  Dispo: Anticipated transfer to [facility] ICU for emergent radiation  after intubation  Thomasene Ripple, MD 03/01/2017, 10:58 AM Pager: [phone]

## 2017-03-01 NOTE — Progress Notes (Signed)
This note also relates to the following rows which could not be included: SpO2 - Cannot attach notes to unvalidated device data  FIO2 decreased to 70% and RR increased to 30 per MD orders based on ABG results.

## 2017-03-01 NOTE — Progress Notes (Signed)
Initial Nutrition Assessment  DOCUMENTATION CODES:   Morbid obesity  INTERVENTION:   -If pt remains intubated, recommend:  Initiate TF via OGT with Prostat 60 ml 5 times daily to provide 1000 kcals, 150 gm protein.   Due to high propofol rate, unable to meet protein needs without exceeding calculated estimated needs  NUTRITION DIAGNOSIS:   Inadequate oral intake related to inability to eat as evidenced by NPO status.  GOAL:   Provide needs based on ASPEN/SCCM guidelines  MONITOR:   Vent status, Labs, Weight trends, Skin, I & O's  REASON FOR ASSESSMENT:   Ventilator, Malnutrition Screening Tool    ASSESSMENT:   37 year old female with recent diagnosis of adenocarcinoma of the lung presenting with bilateral pneumonia and respiratory failure.  Pt admitted with sepsis and acute respiratory failure with hypoxia.   1/21- s/p bronchoscopy, OGT placed  Patient is currently intubated on ventilator support.  MV: 10.6 L/min Temp (24hrs), Avg:99.2 F (37.3 C), Min:98.3 F (36.8 C), Max:99.5 F (37.5 C)  Propofol: 75.9 ml/hr ( provides 2004 kcals)  Reviewed wt hx; noted UBW around 300#. Noted pt has experienced a 3.7% wt loss over the past month, which is not significant for time frame.   Pt failed bi-pap this AM. Pt with lung cancer with LLL collapse secondary to bronchial obstruction. Plan to transfer to [facility] today for radiation.   Labs reviewed.   NUTRITION - FOCUSED PHYSICAL EXAM:    Most Recent Value  Orbital Region  No depletion  Upper Arm Region  No depletion  Thoracic and Lumbar Region  No depletion  Buccal Region  No depletion  Temple Region  No depletion  Clavicle Bone Region  No depletion  Clavicle and Acromion Bone Region  No depletion  Scapular Bone Region  No depletion  Dorsal Hand  No depletion  Patellar Region  No depletion  Anterior Thigh Region  No depletion  Posterior Calf Region  No depletion  Edema (RD Assessment)  Mild  Hair  Reviewed   Eyes  Reviewed  Mouth  Reviewed  Skin  Reviewed  Nails  Reviewed       Diet Order:  Diet NPO time specified  EDUCATION NEEDS:   Not appropriate for education at this time  Skin:  Skin Assessment: Reviewed RN Assessment  Last BM:  02/26/17  Height:   Ht Readings from Last 1 Encounters:  03/01/17 5\' 6"  (1.676 m)    Weight:   Wt Readings from Last 1 Encounters:  02/22/2017 (!) 309 lb 15.5 oz (140.6 kg)    Ideal Body Weight:  59.1 kg  BMI:  Body mass index is 50.03 kg/m.  Estimated Nutritional Needs:   Kcal:  7001-7494  Protein:  > 148 grams  Fluid:  > 1.3 L    Zailyn Thoennes A. Jimmye Norman, RD, LDN, CDE Pager: [phone] After hours Pager: [phone]

## 2017-03-01 NOTE — Consult Note (Signed)
Crystal Gallagher Telephone:([phone]) [phone]   [phone]  CONSULT NOTE  REFERRING PHYSICIAN: Dr. [name]  REASON FOR CONSULTATION:  37 years old African-American female with metastatic lung cancer.  HPI Crystal Gallagher is a 37 y.o. female and never smoker with past medical history significant for asthma.  The patient is currently intubated and her mother was the one giving the history.  She mentioned that her daughter has been complaining of shortness of breath and cough for several months.  She was treated 5 times for questionable pneumonia with no improvement of her condition.  She had CT angiogram of the chest on December 05, 2016 that showed left lower lobe airspace disease with central air bronchograms highly suspicious for pneumonia.  There was left hilar and mediastinal lymphadenopathy suspicious to be reactive in nature.  Repeat CT angiogram scan of the chest on January 17, 2017 showed extensive embolic disease within the right pulmonary arterial tree with evidence of right ventricular strain and the scan was positive for acute PE.  It also showed worsened consolidation and collapse throughout the left lower lobe and patchy pneumonia present throughout the left upper lobe.  There was bilateral hilar and subcarinal lymphadenopathy suspicious to be reactive in nature.  She was a scheduled for bronchoscopy but this was delayed by few weeks because of the weather condition as well as the start of anticoagulation.  On February 24, 2017 the patient underwent bronchoscopy under the care of Dr. Lamonte Sakai.  It showed atelectasis of the left lower lobe with unresolving left lower lobe infiltrate.  There was a lesion found in the left lower lobe and on the left upper lobe carina.  Endobronchial biopsies as well as bronchial brushing and washing were performed.  The final pathology of the left upper lobe carina endobronchial biopsy ([id number]) was consistent with poorly differentiated  carcinoma.  The immunohistochemical stains show the tumor was a strongly positive for Napsin-A and TTF-1 but negative for cytokeratin 5/6.  The immunophenotype is consistent with primary lung adenocarcinoma.  There was insufficient material for molecular testing.  The patient was readmitted to [facility] yesterday with worsening dyspnea.  She underwent repeat bronchoscopy with further biopsies for molecular studies earlier today under the care of Dr. Nelda Marseille.  She is currently intubated for respiratory failure. She has no current fever or chills.  No significant nausea or vomiting. Family history significant for father diagnosed with colon cancer in his 31s.  Grandmother with history of breast cancer. The patient is single and has no children.  She works at the [employer] [place] [employer] [place].  She has no history of smoking, alcohol or drug abuse.  HPI  Past Medical History:  Diagnosis Date  . Asthma   . 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 Pneumonia   . Pulmonary embolism [facility])     Past Surgical History:  Procedure Laterality Date  . APPENDECTOMY    . BREAST SURGERY  breast reduction  . VIDEO BRONCHOSCOPY Bilateral 02/24/2017   Procedure: VIDEO BRONCHOSCOPY WITH FLUORO;  Surgeon: Collene Gobble, MD;  Location: [facility];  Service: Cardiopulmonary;  Laterality: Bilateral;    Family History  Problem Relation Age of Onset  . Cancer Other   . Hypertension Other   . Arthritis Mother   . Colon cancer Father   . Breast cancer Maternal Aunt   . Colon cancer Paternal Uncle   . Stroke Maternal Grandmother   .  Cancer Maternal Grandfather   . Hypertension Maternal Grandfather   . Alzheimer's disease Paternal Grandmother   . Breast cancer Maternal Aunt     Social History Social History   Tobacco Use  . Smoking status: Never Smoker  . Smokeless tobacco: Never Used  Substance Use Topics  . Alcohol use: Yes    Comment: occasionally  . Drug use: No    No  Known Allergies  Current Facility-Administered Medications  Medication Dose Route Frequency Provider Last Rate Last Dose  . ceFEPIme (MAXIPIME) 2 g in dextrose 5 % 50 mL IVPB  2 g Intravenous Q8H Lavenia Atlas, RPH   Stopped at 03/01/17 1455  . chlorhexidine gluconate (MEDLINE KIT) (PERIDEX) 0.12 % solution 15 mL  15 mL Mouth Rinse BID Erick Colace, NP      . etomidate (AMIDATE) injection 16.5-36 mg  16.5-36 mg Intravenous PRN Rush Farmer, MD   20 mg at 03/01/17 1051  . fentaNYL (SUBLIMAZE) bolus via infusion 50 mcg  50 mcg Intravenous Q1H PRN Rush Farmer, MD      . fentaNYL (SUBLIMAZE) injection 100 mcg  100 mcg Intravenous Q15 min PRN Erick Colace, NP      . fentaNYL (SUBLIMAZE) injection 100 mcg  100 mcg Intravenous Q2H PRN Erick Colace, NP      . fentaNYL 2553mg in NS 252m(1043mml) infusion-PREMIX  0-400 mcg/hr Intravenous Continuous NarAldine ContesD 25 mL/hr at 03/01/17 1236 250 mcg/hr at 03/01/17 1236  . heparin ADULT infusion 100 units/mL (25000 units/250m36mdium chloride 0.45%)  1,900 Units/hr Intravenous Continuous WilsLyndee LeoH      . ipratropium-albuterol (DUONEB) 0.5-2.5 (3) MG/3ML nebulizer solution 3 mL  3 mL Nebulization Q4H PRN NedrThomasene Ripple   3 mL at 03/01/17 0433  . [START ON 03/02/2017] MEDLINE mouth rinse  15 mL Mouth Rinse QID BabcErick Colace      . midazolam (VERSED) injection 1-2 mg  1-2 mg Intravenous Q1H PRN YacoRush Farmer   2 mg at 03/01/17 1057  . pantoprazole (PROTONIX) injection 40 mg  40 mg Intravenous Daily BabcErick Colace   40 mg at 03/01/17 1714  . propofol (DIPRIVAN) 1000 MG/100ML infusion  5-100 mcg/kg/min Intravenous Titrated YacoRush Farmer 59.1 mL/hr at 03/01/17 1718 70 mcg/kg/min at 03/01/17 1718  . rocuronium (ZEMURON) injection 55-120 mg  55-120 mg Intravenous PRN YacoRush Farmer   50 mg at 03/01/17 1054  . vancomycin (VANCOCIN) 1,500 mg in sodium chloride 0.9 % 500 mL IVPB  1,500 mg  Intravenous Q8H WilsLyndee LeoH        Review of Systems  Constitutional: positive for fatigue Eyes: negative Ears, nose, mouth, throat, and face: negative Respiratory: positive for cough and dyspnea on exertion Cardiovascular: negative Gastrointestinal: negative Genitourinary:negative Integument/breast: negative Hematologic/lymphatic: negative Musculoskeletal:negative Neurological: negative Behavioral/Psych: negative Endocrine: negative Allergic/Immunologic: negative  Physical Exam  RAL: Intubated. SKIN: skin color, texture, turgor are normal, no rashes or significant lesions HEAD: Normocephalic, No masses, lesions, tenderness or abnormalities EYES: normal, PERRLA EARS: External ears normal, Canals clear OROPHARYNX:lips, buccal mucosa, and tongue normal  NECK: supple, no adenopathy LYMPH:  no palpable lymphadenopathy, no hepatosplenomegaly BREAST:not examined LUNGS: expiratory wheezes bilaterally, scattered rhonchi bilaterally, scattered rales bilaterally HEART: regular rate & rhythm, no murmurs and no gallops ABDOMEN:abdomen soft, non-tender, obese, normal bowel sounds and no masses or organomegaly BACK: Back symmetric, no curvature., No CVA tenderness EXTREMITIES:no joint deformities, effusion,  or inflammation, no edema, no skin discoloration  NEURO: Intubated and sedated.   LABORATORY DATA: Lab Results  Component Value Date   WBC 13.8 (H) 03/01/2017   HGB 9.7 (L) 03/01/2017   HCT 31.9 (L) 03/01/2017   MCV 91.4 03/01/2017   PLT 125 (L) 03/01/2017    '@LASTCHEM' @  RADIOGRAPHIC STUDIES: Dg Chest 1 View  Result Date: 03/01/2017 CLINICAL DATA:  37 year old female with shortness of breath. EXAM: CHEST 1 VIEW COMPARISON:  Chest radiograph dated 02/15/2017 FINDINGS: There is a small left pleural effusion with left mid to lower lung airspace density which may represent atelectasis versus infiltrate mild diffuse central vascular prominence may represent mild  vascular congestion there is a patchy area of airspace opacity in the right upper lung field most concerning for pneumonia. Clinical correlation is recommended. There is no pneumothorax. Stable cardiac silhouette. No acute osseous pathology. IMPRESSION: 1. Left-sided pleural effusion and left lower lobe atelectasis versus infiltrate. 2. Patchy right upper lobe opacity concerning for pneumonia. Clinical correlation is recommended. 3. Probable mild vascular congestion and mild edema. 4. No pneumothorax. Electronically Signed   By: Anner Crete M.D.   On: 02/17/2017 06:59   Dg Chest 2 View  Result Date: 02/15/2017 CLINICAL DATA:  Six months of cough. History of right-sided pulmonary embolism. History of a left-sided cavitary mass. Asthma. EXAM: CHEST  2 VIEW COMPARISON:  CT scan of the chest of January 17, 2017 and chest x-ray of the same day FINDINGS: The right lung is adequately inflated. The interstitial markings on the right are coarse. On the left there is persistent mild volume loss with increased density in the mid and lower lung. Probable persistent left pleural effusion. The heart and pulmonary vascularity are normal. IMPRESSION: Persistent parenchymal consolidation in the mid and lower left lung with probable pleural effusion. Slight interval increase in the conspicuity of the interstitial markings of the right lung may reflect mild interstitial edema. Repeat chest CT scanning is recommended. Electronically Signed   By: David  Martinique M.D.   On: 02/15/2017 11:49   Dg Abd 1 View  Result Date: 02/06/2017 CLINICAL DATA:  Right groin pain. EXAM: ABDOMEN - 1 VIEW COMPARISON:  None. FINDINGS: The bowel gas pattern is normal. No unexpected radio-opaque calculi or other significant radiographic abnormality are seen. Phleboliths noted over the anatomic pelvis bilaterally. IMPRESSION: Negative. Electronically Signed   By: Misty Stanley M.D.   On: 02/06/2017 13:16   Ct Angio Chest Pe W And/or Wo  Contrast  Result Date: 02/22/2017 CLINICAL DATA:  37 year old female with recent history of pulmonary embolism in December 2018. Shortness of breath. EXAM: CT ANGIOGRAPHY CHEST WITH CONTRAST TECHNIQUE: Multidetector CT imaging of the chest was performed using the standard protocol during bolus administration of intravenous contrast. Multiplanar CT image reconstructions and MIPs were obtained to evaluate the vascular anatomy. CONTRAST:  121m ISOVUE-370 IOPAMIDOL (ISOVUE-370) INJECTION 76% COMPARISON:  Chest CT 01/17/2017. FINDINGS: Comment: Today's study is limited by considerable patient respiratory motion. Cardiovascular: No central, lobar or segmental sized filling defects are noted in the pulmonary arterial tree to suggest clinically relevant pulmonary embolism. Smaller subsegmental size filling defects cannot be entirely excluded secondary to respiratory motion. Heart size is borderline enlarged. There is no significant pericardial fluid, thickening or pericardial calcification. No significant aortic atherosclerosis. No coronary artery calcifications. Mediastinum/Nodes: Multiple borderline enlarged and mildly enlarged mediastinal and bilateral hilar lymph nodes, largest of which is in the left hilar region measuring 1.7 cm in short axis. Esophagus is unremarkable  in appearance. No axillary lymphadenopathy. Lungs/Pleura: Persistent complete consolidation of the left lower lobe. Abrupt cut off of the left lower lobe bronchus noted on axial image 47 of series 5. Small to moderate left parapneumonic pleural effusion, increased compared to the prior study, predominantly located in the subpulmonic position. Widespread patchy interstitial and airspace disease scattered throughout the lungs bilaterally, most confluent in the right upper lobe, significantly worsened compared to the prior examination. Upper Abdomen: Unremarkable. Musculoskeletal: Multiple developing areas of sclerosis throughout the visualized axial  and appendicular skeleton, new compared to the prior study from 01/17/2017, concerning for potential metastatic disease. A good example of this is a new 12 mm lesion in the left side of the T11 vertebral body (axial image 79 of series 8). Review of the MIP images confirms the above findings. IMPRESSION: 1. Study was limited by considerable patient respiratory motion. With this limitation in mind there is no evidence of central, lobar or segmental sized pulmonary embolism. 2. Severe multilobar pneumonia. Notably, the patient has complete left lower lobe consolidation persists since 01/17/2017. This is highly unusual, and concerning for probable central obstruction. Left lower lobe bronchus is abruptly cut off on axial image 47 of scan series 5 of today's examination, which could be extrinsic or could be related to an endobronchial lesion. Correlation with bronchoscopy should be considered if not recently performed. New small to moderate parapneumonic pleural effusion, predominantly located sub pulmonic. 3. Interval development of multiple sclerotic osseous lesions. This is highly unusual. The possibility of metastatic disease to the bone should be considered. Electronically Signed   By: Vinnie Langton M.D.   On: 03/11/2017 09:53   Dg Chest Port 1 View  Result Date: 03/01/2017 CLINICAL DATA:  Intubation EXAM: PORTABLE CHEST 1 VIEW COMPARISON:  Portable exam 1147 hours compared to 0657 hours FINDINGS: Tip of endotracheal tube projects 3.6 cm above carina. Nasogastric tube extends into stomach. Normal heart size. Diffuse BILATERAL pulmonary infiltrates slightly increased in LEFT upper lobe since earlier exam. Question LEFT pleural effusion. No pneumothorax. IMPRESSION: Satisfactory endotracheal tube position. Diffuse BILATERAL pulmonary infiltrates slightly increased in LEFT upper lobe. Electronically Signed   By: Lavonia Dana M.D.   On: 03/01/2017 12:08   Dg Chest Port 1 View  Result Date: 03/01/2017 CLINICAL  DATA:  Pleural effusion EXAM: PORTABLE CHEST 1 VIEW COMPARISON:  Chest radiograph from one day prior. FINDINGS: Slightly right rotated chest radiograph. Stable cardiomediastinal silhouette with top-normal heart size. No pneumothorax. No pleural effusion. Stable small left pleural effusion. No right pleural effusion. Stable patchy right upper lung opacity and left lung base consolidation. IMPRESSION: 1. Stable patchy right upper lung and left lung base consolidation, compatible with multilobar pneumonia. Please see the chest CT report from 1 day prior for further details and recommendations. 2. Small left pleural effusion, stable. Electronically Signed   By: Ilona Sorrel M.D.   On: 03/01/2017 08:04   US Pelvic Complete With Transvaginal  Result Date: 02/06/2017 CLINICAL DATA:  Abnormal uterine bleeding.  IUD.  Fibroids. EXAM: TRANSABDOMINAL AND TRANSVAGINAL ULTRASOUND OF PELVIS TECHNIQUE: Both transabdominal and transvaginal ultrasound examinations of the pelvis were performed. Transabdominal technique was performed for global imaging of the pelvis including uterus, ovaries, adnexal regions, and pelvic cul-de-sac. It was necessary to proceed with endovaginal exam following the transabdominal exam to visualize the IUD and ovaries. COMPARISON:  None FINDINGS: Uterus Measurements: 10.5 x 6.3 x 9.9 cm. A large subserosal fibroid is seen in the left posterior corpus 7.1 cm in maximum  diameter. A second fibroid is seen in the left anterior corpus measuring 2.1 cm. Endometrium Thickness: 24 mm. Heterogeneous appearance, but no focal abnormality visualized. No IUD visualized. Right ovary Measurements: 3.4 x 1.7 x 2.5 cm. Normal appearance/no adnexal mass. Left ovary Measurements: 3.3 x 1.8 x 1.8 cm. Normal appearance/no adnexal mass. Other findings No abnormal free fluid. IMPRESSION: Two uterine fibroids, largest measuring 7.1 cm. Thickened heterogeneous endometrium measuring 24 mm. No IUD visualized. Normal appearance  of both ovaries.  No adnexal mass identified. Electronically Signed   By: Earle Gell M.D.   On: 02/06/2017 12:19    ASSESSMENT: This is a very pleasant 37 years old non-smoker African-American female recently diagnosed with metastatic non-small cell lung cancer, poorly differentiated adenocarcinoma presented with obstructive left lower lobe lung mass in addition to collapse and consolidation of the left lower lobe and partially left upper lobe.  The patient also has evidence of metastatic bone disease.  This was diagnosed in January 2019.  PLAN: The patient is currently intubated and sedated.  I had a lengthy discussion with her mother about her current condition and further investigation to complete the staging workup and treatment options. She may benefit from MRI of the brain as well as CT scan of the abdomen and pelvis for now to complete the staging workup of her disease but the priority is to start some form of treatment especially palliative radiotherapy to the consolidative left lower lobe lung mass. I would also ask for the biopsy tissue block to be sent to [facility] as soon as possible for molecular studies and PDL 1 expression. I may also consider doing a blood test for EGFR mutation which may take few days compared to the 3 weeks of the tissue block. I will hold on adding any systemic chemotherapy for now until the patient undergo a course of palliative radiotherapy and the molecular studies becomes available. I requested a consult from radiation oncology to see the patient and start palliative radiation as soon as possible. The patient's mother agreed to the current plan. The patient voices understanding of current disease status and treatment options and is in agreement with the current care plan.  All questions were answered. The patient knows to call the clinic with any problems, questions or concerns. We can certainly see the patient much sooner if necessary.  Thank you so much  for allowing me to participate in the care of Crystal Gallagher. I will continue to follow up the patient with you and assist in her care.  Disclaimer: This note was dictated with voice recognition software. Similar sounding words can inadvertently be transcribed and may not be corrected upon review.   Eilleen Kempf March 01, 2017, 5:20 PM

## 2017-03-01 NOTE — Procedures (Signed)
OGT placed under direct laryngoscopy  OGT placed by MD due to difficulty after respiratory failure.  Verified by stethoscope.  Rush Farmer, M.D. [facility] Pulmonary/Critical Care Medicine. Pager: [phone]. After hours pager: [phone].

## 2017-03-01 NOTE — Significant Event (Signed)
Rapid Response Event Note  Overview:  Called to assist with intubation and bronch and monitor until transfer to [facility] ICU Time Called: [place] Time: 1006 Event Type: Respiratory  Initial Focused Assessment:  Awake and alert - warm and dry - oriented and following commands - on Bipap - with some moderate resp distress.  VSS.  IV's x 2 patent.  Dr. Nelda Marseille at bedside - Mother of patient present - MD is explaining need for treatment - both in agreement.   Interventions:  Assisted with procedures - RT and Pulmonary lab RT's at bedside - sedated per orders Dr. Nelda Marseille - see [facility] for details.  #7.5 ETT placed per Dr. Nelda Marseille 22 cm at lip  - oral NCT palced per Dr. Nelda Marseille - placement confirmed NGT with air bols.  Bronch done.  Stat PCXR done.    Post procedure patient on ventilator - management per RT and Dr. Nelda Marseille - patient dyssynchronous with vent - sedated with Propofol  - up to 80 mcg per protocol - bolused with 100 mcg Fentanyl IV - remains dyssynchorous using abdomianl muscles - ST - started Fentanyl drip - titrated to 150 mcg - still dyssynchorosu - Dr. Nelda Marseille to bedside - orders to increase Propofol to 100 mcg as needed and Fentanyl to 250 mcg - titrated for resps and to maintain BP - vent changes per Dr. Nelda Marseille - see RT flowsheet.  Patient with better synchrony - BP stable - Fentanyl at 250 mcg - Propofol at 90 mcg.  Small amount blood tinged sputum.  Bronch speciemens to lab - spoke with techs regarding need to save specimens for pending lab request from Dr. Wendie Chess per order Dr. Nelda Marseille.  ABG results called to Dr. Nelda Marseille - ventilator settings changed per RT.  Weaning Propofol - now at 80 mcg - MAP remains >65.  [facility] here to transport.  Handoff to [employer].   Plan of Care (if not transferred):  Event Summary: Name of Physician Notified: Dr. Nelda Marseille at (pta RRT)    at    Outcome: Transferred (Comment)(to [facility] ICU)     Quin Hoop

## 2017-03-01 NOTE — Progress Notes (Signed)
Patient being prepared for intubation and bronchoscopy per Dr. Nelda Marseille, MD.  Mother and patient calm, understand events, informed consent signed and on patient chart.  Await Dr. Nelda Marseille.  Patient plan then to transfer to [facility] ICU per [place]

## 2017-03-01 NOTE — Progress Notes (Signed)
   03/01/17 1600  Clinical Encounter Type  Visited With Patient and family together  Visit Type Follow-up  Stress Factors  Family Stress Factors Health changes   Met the mom while at [facility] today. Patient was transferred to [facility].  Met the mom as she was coming on to the floor and showed her where to go.  Visited with the mom and helped get her situated.  Let the medical staff know that mom had come.  Will follow as needed. Chaplain Katherene Ponto

## 2017-03-01 NOTE — Progress Notes (Signed)
Chaplain paged to see patient and mother as she needs to move to ICU Crystal Gallagher causing problems. Mother and patient very upset and concerned.  Had prayer with patient and mother and offered encouragement as they prepare to move on to next step of journey. Crystal Gallagher, Chaplain   03/01/17 1000  Clinical Encounter Type  Visited With Patient and family together  Visit Type Initial;Spiritual support;Other (Comment) (Critical moving to ICU [facility])  Referral From Nurse  Consult/Referral To Chaplain  Spiritual Encounters  Spiritual Needs Prayer;Emotional  Stress Factors  Patient Stress Factors Major life changes;Other (Comment)  Family Stress Factors Health changes (tumor needing treatment)

## 2017-03-01 NOTE — Progress Notes (Signed)
PULMONARY / CRITICAL CARE MEDICINE   Name: Crystal Gallagher MRN: [id number] DOB: 12-17-1980    ADMISSION DATE:  03/02/2017 CONSULTATION DATE:  1/20  REFERRING MD:  Dr Dareen Piano  CHIEF COMPLAINT:  Concern for left pleural effusion , progressive dyspnea   HISTORY OF PRESENT ILLNESS:    This is a 37 yo woman with history of asthma, pneumonia, PE, lifetime nonsmoker, who presented for progressively worsening dyspnea.  She was hospitalized at [facility] for a bronchoscopy that was completed on 02/24/2017 with Dr. Lamonte Sakai. Since that procedure the patient has noticed progressively worsening shortness of breath with exertion. She states that over the past few days it has progressively worsened, and she also endorses intermittent cough with rust-colored sputum, which has occurred since her brochoscopy. Patient notes intermittent fevers and chills over the past few days. Denies chest pain, nausea, vomiting, bilateral lower extremity swelling, dysuria, increased urinary frequency, and change in bowel movements, (per ROS taken by the admitting intern.)  She underwent a CT angio of the chest on 12/9 which showed PE predominant on the right side. She was started on anticoagulation and transitioned to xarelto. AC course was complicated by uterine bleeding that resolved. CT imaging from 01/17/17 revealed progression of LLL infiltrate and an apparent cutoff sign.  She was admitted 1/15 for planned bronchoscopy with IV heparin bridge for anticoagulation.  The patient was taken for bronchoscopy on 7/67 which was complicated by persistent coughing and difficult to obtain tissue sampling. An area of erythema, edema was observed in the L lower lobe. A non-obstructing and friable submucosal lesion was identified at the LUL carina that extended into LLL. This endobronchial mass did not obstruct the airway and was biopsied during the procedure. 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 The results were not returned to the patient when she presented to the ED.  In the ER, we were consulted because of questionable effusion, and the possibility of thoracentesis. We performed ultrasound, and there was no fluid to be seen.   SUBJECTIVE:  Marked accessory use   VITAL SIGNS: BP (!) 147/85 (BP Location: Left Arm)   Pulse (!) 116   Temp 99.5 F (37.5 C) (Axillary)   Resp (!) 31   Ht 5\' 6"  (1.676 m)   Wt (!) 309 lb 15.5 oz (140.6 kg)   LMP 02/01/2017 (Approximate) Comment: pt shielded  SpO2 92%   BMI 50.03 kg/m   HEMODYNAMICS:    VENTILATOR SETTINGS: FiO2 (%):  [40 %] 40 %  INTAKE / OUTPUT:  Intake/Output Summary (Last 24 hours) at 03/01/2017 1040 Last data filed at 03/01/2017 0400 Gross per 24 hour  Intake 5050 ml  Output 200 ml  Net 4850 ml     PHYSICAL EXAMINATION: General  This is a 37 year old aaf currently on NIPPV w/ marked accessory use.  HENT: NCAT. BIPAP mask in place. No JVD Pulm: decreased on left. Crackles on right. Paradoxical efforts  Card: RRR  Ext: no edema brisk CR Abd: soft not tender + bowel sounds Neuro: awake, no focal def.   LABS:  BMET Recent Labs  Lab 02/23/17 1731 02/19/2017 0618 03/01/17 0253  NA 140 141 144  K 3.7 3.9 3.9  CL 108 108 113*  CO2 23 19* 20*  BUN 11 10 12   CREATININE 0.66 0.78 0.65  GLUCOSE 97 106* 88    Electrolytes Recent Labs  Lab 02/23/17 1731 03/01/2017 0618 03/01/17 0253  CALCIUM 9.2  9.0 8.6*  MG 2.2  --   --   PHOS 3.4  --   --     CBC Recent Labs  Lab 02/25/17 0148 02/11/2017 0618 03/01/17 0253  WBC 13.5* 15.2* 13.8*  HGB 10.2* 11.0* 9.7*  HCT 32.1* 36.0 31.9*  PLT 211 163 125*    Coag's Recent Labs  Lab 02/23/17 1731 02/24/17 0600 02/25/17 0148 02/25/17 1026  APTT 32 40* 52* 51*  INR 1.46  --   --   --     Sepsis Markers Recent Labs  Lab 03/10/2017 0712 02/27/2017 1017 02/19/2017 1805  LATICACIDVEN 1.52 1.06  --   PROCALCITON  --   --   0.34    ABG Recent Labs  Lab 03/11/2017 0738 03/01/17 0030  PHART 7.385 7.364  PCO2ART 34.5 36.5  PO2ART 181.0* 87.8    Liver Enzymes No results for input(s): AST, ALT, ALKPHOS, BILITOT, ALBUMIN in the last 168 hours.  Cardiac Enzymes No results for input(s): TROPONINI, PROBNP in the last 168 hours.  Glucose No results for input(s): GLUCAP in the last 168 hours.  Imaging Dg Chest Port 1 View  Result Date: 03/01/2017 CLINICAL DATA:  Pleural effusion EXAM: PORTABLE CHEST 1 VIEW COMPARISON:  Chest radiograph from one day prior. FINDINGS: Slightly right rotated chest radiograph. Stable cardiomediastinal silhouette with top-normal heart size. No pneumothorax. No pleural effusion. Stable small left pleural effusion. No right pleural effusion. Stable patchy right upper lung opacity and left lung base consolidation. IMPRESSION: 1. Stable patchy right upper lung and left lung base consolidation, compatible with multilobar pneumonia. Please see the chest CT report from 1 day prior for further details and recommendations. 2. Small left pleural effusion, stable. Electronically Signed   By: Ilona Sorrel M.D.   On: 03/01/2017 08:04     STUDIES:   CT Angio: IMPRESSION:1. Study was limited by considerable patient respiratory motion.With this limitation in mind there is no evidence of central, lobar or segmental sized pulmonary embolism.2. Severe multilobar pneumonia. Notably, the patient has complete left lower lobe consolidation persists since 01/17/2017. This is highly unusual, and concerning for probable central obstruction. Left lower lobe bronchus is abruptly cut off on axial image 47 of scan series 5 of today's examination, which could be extrinsic or could be related to an endobronchial lesion. Correlation with bronchoscopy should be considered if not recently performed. New small to moderate parapneumonic pleural effusion, predominantly located sub pulmonic. 3. Interval development of  multiple sclerotic osseous lesions. This is highly unusual. The possibility of metastatic disease to the bone should be considered.    CULTURES:   ANTIBIOTICS: Vanc 1/20 >> Cefepime 1/20 >>  SIGNIFICANT EVENTS: Bronchoscopy biopsy result 1/17 Immunohistochemistry shows the tumor is strongly positive for Napsin A and thyroid transcription. factor-1 (TTF-1) and negative with cytokeratin 5/6. The immunophenotype is consistent with primary lung adenocarcinoma. (JDP:ecj 02/25/2017)  LINES/TUBES:   DISCUSSION: 37 year old woman recently diagnosed with PE on xarelto, who recently had a bronchoscopy done presents with worsening shortness of breath, and biopsy results notable for primary lung adenocarcinoma of the lung.   ASSESSMENT / PLAN:  Acute hypoxic respiratory failure post obstructive atelectasis +/- PNA in setting of adenocarcinoma of the lung  -CT chest w/ new multilobar airspace disease ? PNA, also could reflect progression of disease. Has complete L lower lobe consolidation w/ LL lobe bronchus abruptly cut off ? extrinsic vs endobronchial. Also w/ new development of mult sclerotic osseus lesions.  -failing NIPPV. Now in acute  respiratory distress  Plan Intubate/ventilate PAD protocol RAS goal -2 Cont empiric abx; will get sputum culture w/ bronch  Bronch after intubation will get additional biopsies and tumor markers Day # 2 vanc and cefepime  We have called Heme/onc; will need emergent radiation & may need transfer to tertiary care if we can get to point where endobronchial stent an option   History of PE:  Plan Change LMWH to IV heparin   Hyperchloremia  Plan  Change IVF to LR  Anemia of critical illness Plan Trend cbc Transfuse per protocol  Primary lung adenocarcinoma:  Plan Heme/onc consulted   My cct 63 min  Erick Colace ACNP-BC [place] Pager # [phone] OR # [phone] if no answer  03/01/2017, 10:23 AM  Attending Note:  37  year old female with recent diagnosis of adenocarcinoma of the lung presenting with bilateral pneumonia and respiratory failure.  Patient is failing BiPAP.  On exam, patient is not able to speak in more than 2 word sentences.  She is visibly in respiratory distress on BiPAP and unable to lay flat.  Had multiple discussions with Dr. Lamonte Sakai (PCCM), Dr. Julien Nordmann (H/O) and IMTS-MD.  After discussion amongst all groups decision was made to proceed with intubation, bronch since there were not enough samples for markers so re-biopsy, transfer to [place] [facility] hospital for radiation.  PCCM will assume care as primary.  All arrangement made.  The patient is critically ill with multiple organ systems failure and requires high complexity decision making for assessment and support, frequent evaluation and titration of therapies, application of advanced monitoring technologies and extensive interpretation of multiple databases.   Critical Care Time devoted to patient care services described in this note is  110  Minutes. This time reflects time of care of the signee Dr [name]. This critical care time does not reflect procedure time, or teaching time or supervisory time of PA/NP/Med student/Med Resident etc but could involve care discussion time.  Rush Farmer, M.D. [facility] Pulmonary/Critical Care Medicine. Pager: [phone]. After hours pager: [phone].

## 2017-03-01 NOTE — Progress Notes (Signed)
This note also relates to the following rows which could not be included: SpO2 - Cannot attach notes to unvalidated device data  Changes made to ventilator per MD due to increased peak/plateau pressures.

## 2017-03-01 NOTE — Procedures (Signed)
Bronchoscopy Procedure Note ADELEIGH BARLETTA [id number] December 05, 1980  Procedure: Bronchoscopy Indications: Biopsy of lung cancer  Procedure Details Consent: Risks of procedure as well as the alternatives and risks of each were explained to the (patient/caregiver).  Consent for procedure obtained. Time Out: Verified patient identification, verified procedure, site/side was marked, verified correct patient position, special equipment/implants available, medications/allergies/relevent history reviewed, required imaging and test results available.  Performed  In preparation for procedure, patient was given 100% FiO2, bronchoscope lubricated and inhaled beta agonist administered. Sedation: Benzodiazepines, Muscle relaxants, Etomidate and Fentanyl  Airway entered and the following bronchi were examined: RUL, RML, RLL, LUL, LLL and Bronchi.   Procedures performed: Brushings performed x2, biopsy x6 and wash x2 from LLL Bronchoscope removed.    Evaluation Hemodynamic Status: BP stable throughout; O2 sats: stable throughout Patient's Current Condition: stable Specimens:  Sent serosanguinous fluid Complications: No apparent complications Patient did tolerate procedure well.   [name] 03/01/2017

## 2017-03-01 NOTE — Telephone Encounter (Signed)
She was re-admitted. These have been discussed

## 2017-03-01 NOTE — Progress Notes (Signed)
ABG results called to Dr. Nelda Marseille.  Verbal orders for ventilator changes.  Amy RT here to make changes.

## 2017-03-01 NOTE — Progress Notes (Signed)
[place] for lovenox >>heparin Indication: history of PE on xarelto PTA   No Known Allergies  Patient Measurements: Height: 5\' 6"  (167.6 cm) Weight: (!) 309 lb 15.5 oz (140.6 kg) IBW/kg (Calculated) : 59.3   Vital Signs: Temp: 99.5 F (37.5 C) (01/21 0832) Temp Source: Axillary (01/21 0832) BP: 147/85 (01/21 0832) Pulse Rate: 116 (01/21 0832)  Labs: Recent Labs    03/01/2017 0618 03/01/17 0253  HGB 11.0* 9.7*  HCT 36.0 31.9*  PLT 163 125*  CREATININE 0.78 0.65    Estimated Creatinine Clearance: 138.2 mL/min (by C-G formula based on SCr of 0.65 mg/dL).   Medical History: Past Medical History:  Diagnosis Date  . Asthma   . 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 Pneumonia   . Pulmonary embolism [facility])      Assessment: 37 y.o. female with a history of right PE on xarelto prior to admission, last dose on 1/19 at 1630. Patient was changed to lovenox yesterday, received dose this am ~0845. New orders to change to heparin, she will not need heparin until this evening.   Hemoglobin is down 11.0>9.7, platelets also down 163 to 125.   Goal of Therapy:  Aptt goal 66-102 Heparin level 0.3-0.7 Monitor platelets by anticoagulation protocol: Yes   Plan:  Stop lovenox, plan to start heparin at 1900 units/hr at 8pm tonight If patient get a central line this afternoon will check baseline anti-xa level and aptt Check anti-Xa level 6 hours after infusion started   Erin Hearing PharmD., BCPS Clinical Pharmacist 03/01/2017 10:14 AM

## 2017-03-01 NOTE — Procedures (Signed)
Intubation Procedure Note Crystal Gallagher [id number] 23-Feb-1980  Procedure: Intubation Indications: Airway protection and maintenance  Procedure Details Consent: Risks of procedure as well as the alternatives and risks of each were explained to the (patient/caregiver).  Consent for procedure obtained. Time Out: Verified patient identification, verified procedure, site/side was marked, verified correct patient position, special equipment/implants available, medications/allergies/relevent history reviewed, required imaging and test results available.  Performed  Maximum sterile technique was used including gloves, hand hygiene and mask.  MAC    Evaluation Hemodynamic Status: BP stable throughout; O2 sats: stable throughout Patient's Current Condition: stable Complications: No apparent complications Patient did tolerate procedure well. Chest X-ray ordered to verify placement.  CXR: pending.   Crystal Gallagher 03/01/2017

## 2017-03-02 ENCOUNTER — Inpatient Hospital Stay (HOSPITAL_COMMUNITY): Payer: 59

## 2017-03-02 ENCOUNTER — Ambulatory Visit
Admit: 2017-03-02 | Discharge: 2017-03-02 | Disposition: A | Discharge status: Home / Self Care | Payer: 59 | Attending: Radiation Oncology | Admitting: Radiation Oncology

## 2017-03-02 ENCOUNTER — Encounter: Payer: Self-pay | Admitting: Emergency Medicine

## 2017-03-02 DIAGNOSIS — C3492 Malignant neoplasm of unspecified part of left bronchus or lung: Secondary | ICD-10-CM

## 2017-03-02 DIAGNOSIS — C3432 Malignant neoplasm of lower lobe, left bronchus or lung: Secondary | ICD-10-CM

## 2017-03-02 DIAGNOSIS — J9602 Acute respiratory failure with hypercapnia: Secondary | ICD-10-CM

## 2017-03-02 LAB — BASIC METABOLIC PANEL
Anion gap: 7 (ref 5–15)
BUN: 19 mg/dL (ref 6–20)
CALCIUM: 8.4 mg/dL — AB (ref 8.9–10.3)
CHLORIDE: 112 mmol/L — AB (ref 101–111)
CO2: 19 mmol/L — ABNORMAL LOW (ref 22–32)
CREATININE: 1.28 mg/dL — AB (ref 0.44–1.00)
GFR, EST NON AFRICAN AMERICAN: 52 mL/min — AB (ref >60)
Glucose, Bld: 88 mg/dL (ref 65–99)
Potassium: 4.1 mmol/L (ref 3.5–5.1)
SODIUM: 138 mmol/L (ref 135–145)

## 2017-03-02 LAB — BLOOD GAS, ARTERIAL
Acid-base deficit: 7 mmol/L — ABNORMAL HIGH (ref 0.0–2.0)
Bicarbonate: 18.7 mmol/L — ABNORMAL LOW (ref 20.0–28.0)
DRAWN BY: 232811
FIO2: 60
MECHVT: 410 mL
O2 Saturation: 97.8 %
PEEP: 10 cmH2O
Patient temperature: 37.5
RATE: 30 resp/min
pCO2 arterial: 42.2 mmHg (ref 32.0–48.0)
pH, Arterial: 7.272 — ABNORMAL LOW (ref 7.350–7.450)
pO2, Arterial: 114 mmHg — ABNORMAL HIGH (ref 83.0–108.0)

## 2017-03-02 LAB — CBC
HCT: 28.7 % — ABNORMAL LOW (ref 36.0–46.0)
HEMOGLOBIN: 8.7 g/dL — AB (ref 12.0–15.0)
MCH: 28.2 pg (ref 26.0–34.0)
MCHC: 30.3 g/dL (ref 30.0–36.0)
MCV: 93.2 fL (ref 78.0–100.0)
PLATELETS: 137 10*3/uL — AB (ref 150–400)
RBC: 3.08 MIL/uL — ABNORMAL LOW (ref 3.87–5.11)
RDW: 15.6 % — AB (ref 11.5–15.5)
WBC: 11.8 10*3/uL — ABNORMAL HIGH (ref 4.0–10.5)

## 2017-03-02 LAB — MYCOPLASMA PNEUMONIAE ANTIBODY, IGM

## 2017-03-02 LAB — VANCOMYCIN, TROUGH: Vancomycin Tr: 33 ug/mL (ref 15–20)

## 2017-03-02 LAB — APTT
APTT: 66 s — AB (ref 24–36)
APTT: 60 s — AB (ref 24–36)

## 2017-03-02 LAB — GLUCOSE, CAPILLARY
GLUCOSE-CAPILLARY: 91 mg/dL (ref 65–99)
GLUCOSE-CAPILLARY: 90 mg/dL (ref 65–99)

## 2017-03-02 LAB — HEPARIN LEVEL (UNFRACTIONATED)
HEPARIN UNFRACTIONATED: 0.93 [IU]/mL — AB (ref 0.30–0.70)
HEPARIN UNFRACTIONATED: 0.7 [IU]/mL (ref 0.30–0.70)
HEPARIN UNFRACTIONATED: 0.57 [IU]/mL (ref 0.30–0.70)

## 2017-03-02 LAB — LEGIONELLA PNEUMOPHILA SEROGP 1 UR AG: L. pneumophila Serogp 1 Ur Ag: NEGATIVE

## 2017-03-02 LAB — MAGNESIUM: Magnesium: 2.3 mg/dL (ref 1.7–2.4)

## 2017-03-02 LAB — PHOSPHORUS: PHOSPHORUS: 4.7 mg/dL — AB (ref 2.5–4.6)

## 2017-03-02 MED ORDER — HEPARIN (PORCINE) IN NACL 100-0.45 UNIT/ML-% IJ SOLN
1750.0000 [IU]/h | INTRAMUSCULAR | Status: DC
  Administered 2017-03-02 – 2017-03-04 (×3): 1750 [IU]/h via INTRAVENOUS
  Filled 2017-03-02 (×3): qty 250

## 2017-03-02 MED ORDER — HEPARIN (PORCINE) IN NACL 100-0.45 UNIT/ML-% IJ SOLN: 1600.0000 [IU]/h | INTRAMUSCULAR | Status: DC

## 2017-03-02 MED ORDER — PRO-STAT SUGAR FREE PO LIQD
30.0000 mL | Freq: Every day | ORAL | Status: DC
  Administered 2017-03-02 – 2017-03-04 (×3): 30 mL
  Filled 2017-03-02 (×3): qty 30

## 2017-03-02 MED ORDER — SODIUM CHLORIDE 0.9 % IV BOLUS (SEPSIS)
1000.0000 mL | Freq: Once | INTRAVENOUS | Status: AC
  Administered 2017-03-02: 1000 mL via INTRAVENOUS

## 2017-03-02 MED ORDER — PRO-STAT SUGAR FREE PO LIQD
60.0000 mL | Freq: Two times a day (BID) | ORAL | Status: DC
Start: 2017-03-02 — End: 2017-03-08
  Administered 2017-03-02 – 2017-03-07 (×12): 60 mL
  Filled 2017-03-02 (×12): qty 60

## 2017-03-02 MED ORDER — PHENYLEPHRINE HCL-NACL 10-0.9 MG/250ML-% IV SOLN
0.0000 ug/min | INTRAVENOUS | Status: DC
  Filled 2017-03-02: qty 250

## 2017-03-02 MED ORDER — VITAL HIGH PROTEIN PO LIQD
1000.0000 mL | ORAL | Status: DC
  Filled 2017-03-02: qty 1000

## 2017-03-02 MED ORDER — HEPARIN (PORCINE) IN NACL 100-0.45 UNIT/ML-% IJ SOLN
1750.0000 [IU]/h | INTRAMUSCULAR | Status: DC
  Administered 2017-03-02: 1750 [IU]/h via INTRAVENOUS
  Filled 2017-03-02: qty 250

## 2017-03-02 NOTE — Progress Notes (Signed)
Pharmacy Antibiotic Note  Crystal Gallagher is a 37 y.o. female admitted on 02/20/2017 with pneumonia.  Pharmacy has been consulted for Vancomycin and Cefepime dosing. CXR concerning for pna.   Today, 03/02/2017  SCr has doubled: 0.65 >> 1.28 Vancomycin trough elevated 11mg/ml on 15075mIV q8h  Plan:  Hold vancomycin  Recheck level and SCr in AM  F/u duration of therapy - consider stopping altogether since MRSA PCR is negative  Height: _0  (167.6 cm) Weight: (!) 309 lb 15.5 oz (140.6 kg) IBW/kg (Calculated) : 59.3  Temp (24hrs), Avg:99.9 F (37.7 C), Min:98.4 F (36.9 C), Max:100.4 F (38 C)  Recent Labs  Lab 02/23/17 1731 02/25/17 0148 03/10/2017 0618 02/23/2017 0712 03/01/2017 1017 03/01/17 0253 03/02/17 0237 03/02/17 1058  WBC 15.5* 13.5* 15.2*  --   --  13.8* 11.8*  --   CREATININE 0.66  --  0.78  --   --  0.65 1.28*  --   LATICACIDVEN  --   --   --  1.52 1.06  --   --   --   VANCOTROUGH  --   --   --   --   --   --   --  33*    Estimated Creatinine Clearance: 86.4 mL/min (A) (by C-G formula based on SCr of 1.28 mg/dL (H)).    No Known Allergies  Antimicrobials this admission:  1/20 Vancomycin >> 1/20 Cefepime >>  Dose adjustments this admission:  1/22 Hold vancomycin for trough 33 mcg/ml  Microbiology results:  1/20 BCx: ngtd 1/20 MRSA PCR: neg 1/20 Respiratory panel: neg 1/21 BAL: no org seen  Thank you for allowing pharmacy to be a part of this patient's care.  ErPeggyann JubaPharmD, BCPS Pager: [phone]/22/2019 12:47 PM

## 2017-03-02 NOTE — Care Management Note (Signed)
Case Management Note  Patient Details  Name: Crystal Gallagher MRN: [id number] Date of Birth: 20-Jan-1981  Subjective/Objective:                  Resp. Failure on ventilator at full support  Action/Plan: Date: February 27, 2017 Velva Harman, BSN, [place], [place] Chart and notes review for patient progress and needs. Will follow for case management and discharge needs. No cm or discharge needs present at time of this review. Next review date: [id number]  Expected Discharge Date:  03/04/17               Expected Discharge Plan:  Home/Self Care  In-House Referral:     Discharge planning Services  CM Consult  Post Acute Care Choice:    Choice offered to:     DME Arranged:    DME Agency:     HH Arranged:    HH Agency:     Status of Service:  In process, will continue to follow  If discussed at Long Length of Stay Meetings, dates discussed:    Additional Comments:  Leeroy Cha, RN 03/02/2017, 8:49 AM

## 2017-03-02 NOTE — Progress Notes (Signed)
Nutrition Follow-up  DOCUMENTATION CODES:   Morbid obesity  INTERVENTION:  - Will order 60 mL Prostat BID and 30 mL Prostat once/day. This regimen + kcal from current Propofol rate will provide 1614 kcal (109% maximum estimated kcal need) and 75 grams of protein (51% estimated protein need). - Will adjust nutrition regimen with changes in Propofol rate.   NUTRITION DIAGNOSIS:   Inadequate oral intake related to inability to eat as evidenced by NPO status. -ongoing  GOAL:   Provide needs based on ASPEN/SCCM guidelines -unmet at this time  MONITOR:   Vent status, TF tolerance, Weight trends, Labs  REASON FOR ASSESSMENT:   Consult Enteral/tube feeding initiation and management  ASSESSMENT:   37 year old female with recent diagnosis of adenocarcinoma of the lung presenting with bilateral pneumonia and respiratory failure.  Pt seen by another RD at [facility] yesterday and then was transferred to [facility]. No new weight since admission (1/20). Mom was at bedside and reports that around August or September pt began having spells of coughing and feeling SOB and that this has worsened over time. She reports that in December it began to get worse even more quickly. This was affecting pt's appetite/ability to eat. She was still mainly eating 2-3 meals/day but the amount consumed per meal was less than her usual. Amount consumed per meal decreased as coughing and SOB worsened.   OGT in place and CXR from yesterday states tube is in the stomach. Per Dr. Bari Mantis note this AM, pt s/p biopsies indicating poorly differentiated adenocarcinoma indicating lung primary and possible bone mets present. Pt to go for radiation simulation/marking today and to start palliative radiation, per RN, with no plan for chemo at this time. RN reported during rounds that family may not be aware that radiation is strictly palliative.   Patient is currently intubated on ventilator support MV: 13.1 L/min Temp (24hrs),  Avg:99.9 F (37.7 C), Min:98.4 F (36.9 C), Max:100.4 F (38 C) Propofol: 42.2 ml/hr (1114 kcal)  Medications reviewed; 40 mg IV Protonix/day. Labs reviewed; Cl: 112 mmol/L, creatinine: 1.28 mg/dL, Ca: 8.4 mg/dL, Phos: 4.7 mg/dL.  Drips: Propofol @ 50 mcg/kg/min, Heparin @ 1750 units/hr.      Diet Order:  Diet NPO time specified  EDUCATION NEEDS:   Not appropriate for education at this time  Skin:  Skin Assessment: Reviewed RN Assessment  Last BM:  1/18 (PTA)  Height:   Ht Readings from Last 1 Encounters:  03/01/17 5\' 6"  (1.676 m)    Weight:   Wt Readings from Last 1 Encounters:  02/26/2017 (!) 309 lb 15.5 oz (140.6 kg)    Ideal Body Weight:  59.1 kg  BMI:  Body mass index is 50.03 kg/m.  Estimated Nutritional Needs:   Kcal:  6629-4765  Protein:  > 148 grams  Fluid:  > 1.3 L      Jarome Matin, MS, RD, LDN, [facility] Inpatient Clinical Dietitian Pager # [phone] After hours/weekend pager # [phone]

## 2017-03-02 NOTE — Progress Notes (Signed)
[place] for lovenox >>heparin Indication: history of PE on xarelto PTA   No Known Allergies  Patient Measurements: Height: 5\' 6"  (167.6 cm) Weight: (!) 309 lb 15.5 oz (140.6 kg) IBW/kg (Calculated) : 59.3   Vital Signs: Temp: 99.7 F (37.6 C) (01/22 0328) BP: 94/51 (01/22 0328) Pulse Rate: 101 (01/22 0328)  Labs: Recent Labs    03/09/2017 0618 03/01/17 0253 03/02/17 0237  HGB 11.0* 9.7* 8.7*  HCT 36.0 31.9* 28.7*  PLT 163 125* 137*  HEPARINUNFRC  --   --  0.93*  CREATININE 0.78 0.65 1.28*    Estimated Creatinine Clearance: 86.4 mL/min (A) (by C-G formula based on SCr of 1.28 mg/dL (H)).   Medical History: Past Medical History:  Diagnosis Date  . Asthma   . 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 Pneumonia   . Pulmonary embolism [facility])      Assessment: 37 y.o. female with a history of right PE on xarelto prior to admission, last dose on 1/19 at 1630. Patient was changed to lovenox yesterday, received dose this am ~0845. New orders to change to heparin, she will not need heparin until this evening.   Hemoglobin is down 11.0>9.7>8.7, platelets also down 163>125>137 0237 HL=0.93 above goal, no bleeding , infusion issues per RN Ordered aPtt now to be added to HL already drawn- hopefully xarelto affect on HL has diminshed.  APtt=66 (low end of therapeutic).   Will decrease heparin drip slightly not sure if HL is being affected by xarelto or possible by Lovenox from 1/21 since renal function is worsening.   Goal of Therapy:  Aptt goal 66-102 Heparin level 0.3-0.7 Monitor platelets by anticoagulation protocol: Yes   Plan:  Decrease heparin drip to 1750 units/hr Recheck HL and aptt in 6 hours   Dorrene German 03/02/2017 4:29 AM

## 2017-03-02 NOTE — Progress Notes (Addendum)
[place] for lovenox >>heparin Indication: history of PE on xarelto PTA   No Known Allergies  Patient Measurements: Height: 5\' 6"  (167.6 cm) Weight: (!) 309 lb 15.5 oz (140.6 kg) IBW/kg (Calculated) : 59.3   Vital Signs: Temp: 100.4 F (38 C) (01/22 1100) BP: 142/73 (01/22 1100) Pulse Rate: 109 (01/22 1100)  Labs: Recent Labs    02/27/2017 0618 03/01/17 0253 03/02/17 0237 03/02/17 0318 03/02/17 1056 03/02/17 1058  HGB 11.0* 9.7* 8.7*  --   --   --   HCT 36.0 31.9* 28.7*  --   --   --   PLT 163 125* 137*  --   --   --   APTT  --   --   --  66* 60*  --   HEPARINUNFRC  --   --  0.93*  --   --  0.70  CREATININE 0.78 0.65 1.28*  --   --   --     Estimated Creatinine Clearance: 86.4 mL/min (A) (by C-G formula based on SCr of 1.28 mg/dL (H)).   Medical History: Past Medical History:  Diagnosis Date  . Asthma   . 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 Pneumonia   . Pulmonary embolism [facility])      Assessment: 37 y.o. female with a history of right PE on xarelto prior to admission, last dose on 1/19 at 1630. Patient was changed to lovenox 1/20, received dose in the am ~0845. New orders to change to heparin.  Today, 03/02/2017  Heparin level therapeutic (0.70)   aPTT is subtherapeutic (60 sec) but effects of xarelto should be diminished at this point (~72hr since last dose)  CBC: Hgb decreased 8.7; Plts low at 137 but improved  Per discussion with RN, some bleeding noted with oral suctioning, CCM aware and monitoring  Goal of Therapy:  aPTT goal 66-102 Heparin level 0.3-0.7 Monitor platelets by anticoagulation protocol: Yes   Plan:  Continue heparin at 1750 units/hr Recheck HL 6 hours to verify therapeutic  Peggyann Juba, PharmD, BCPS Pager: [phone] 03/02/2017 2:25 PM

## 2017-03-02 NOTE — Progress Notes (Signed)
eLink Physician-Brief Progress Note Patient Name: Crystal Gallagher DOB: [birth year] MRN: [id number]   Date of Service  03/02/2017  HPI/Events of Note  Hypotension - BP = 89/29 - Likely related to volume depletion and sedation with Propofol and Propofol IV infusions. No CVL or CVP. Last LVEF = 60-65%  eICU Interventions  Will order: 1. Bolus with 0.9 NaCl 1 liter IV oer 1 hour now.  2. Phenylephrine IV infusion. Titrate to MAP > 65. 3. Wean Propofol and Fentanyl IV infusions as tolerated.      Intervention Category Major Interventions: Hypotension - evaluation and management  Sommer,Steven Eugene 03/02/2017, 2:32 AM

## 2017-03-02 NOTE — Progress Notes (Signed)
Mildly bloody secretions noted from subglottic suction and w/ oral suction. Noe Gens, NP made aware during AM rounds. No new orders at this time. Will continue to monitor closely.

## 2017-03-02 NOTE — Progress Notes (Signed)
[place] for lovenox >>heparin Indication: history of PE on xarelto PTA   No Known Allergies  Patient Measurements: Height: 5\' 6"  (167.6 cm) Weight: (!) 309 lb 15.5 oz (140.6 kg) IBW/kg (Calculated) : 59.3   Vital Signs: Temp: 100.4 F (38 C) (01/22 2100) Temp Source: Core (01/22 2000) BP: 139/81 (01/22 2100) Pulse Rate: 115 (01/22 2100)  Labs: Recent Labs    02/20/2017 0618 03/01/17 0253 03/02/17 0237 03/02/17 0318 03/02/17 1056 03/02/17 1058 03/02/17 2105  HGB 11.0* 9.7* 8.7*  --   --   --   --   HCT 36.0 31.9* 28.7*  --   --   --   --   PLT 163 125* 137*  --   --   --   --   APTT  --   --   --  66* 60*  --   --   HEPARINUNFRC  --   --  0.93*  --   --  0.70 0.57  CREATININE 0.78 0.65 1.28*  --   --   --   --     Estimated Creatinine Clearance: 86.4 mL/min (A) (by C-G formula based on SCr of 1.28 mg/dL (H)).   Medical History: Past Medical History:  Diagnosis Date  . Asthma   . 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 Pneumonia   . Pulmonary embolism [facility])      Assessment: 37 y.o. female with a history of right PE on xarelto prior to admission, last dose on 1/19 at 1630. Patient was changed to lovenox 1/20, received dose in the am ~0845. New orders to change to heparin.  Today, 03/02/2017  Heparin level therapeutic (0.57) on heparin drip at 1750 units/hr  Per discussion with RN, some bleeding noted with oral suctioning, CCM aware and monitoring  Goal of Therapy:  aPTT goal 66-102 Heparin level 0.3-0.7 Monitor platelets by anticoagulation protocol: Yes   Plan:  Continue heparin at 1750 units/hr Daily heparin level and CBC  Eudelia Bunch, Pharm.D. [phone] 03/02/2017 9:58 PM

## 2017-03-02 NOTE — Progress Notes (Signed)
PULMONARY / CRITICAL CARE MEDICINE   Name: Crystal Gallagher MRN: [id number] DOB: 08-27-1980    ADMISSION DATE:  03/01/2017 CONSULTATION DATE:  1/20  REFERRING MD:  Dr Dareen Piano  CHIEF COMPLAINT:  Concern for left pleural effusion , progressive dyspnea   HISTORY OF PRESENT ILLNESS:    This is a 37 yo woman with history of asthma, pneumonia, PE, lifetime nonsmoker, who presented 1/20 for progressively worsening dyspnea & progressive LLL atelectasis requiring mechanical ventilation  She underwent a CT angio of the chest on 12/9 which showed PE predominant on the right side. She was started on anticoagulation and transitioned to xarelto. AC course was complicated by uterine bleeding that resolved. CT imaging from 01/17/17 revealed progression of LLL infiltrate and an apparent cutoff sign.  She was admitted 1/15 for planned bronchoscopy with IV heparin bridge for anticoagulation.  The patient was taken for bronchoscopy on 3/66 which was complicated by persistent coughing and difficult to obtain tissue sampling. An area of erythema, edema was observed in the L lower lobe. A non-obstructing and friable submucosal lesion was identified at the LUL carina that extended into LLL. This endobronchial mass did not obstruct the airway and was biopsied during the procedure. 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 The results were not returned to the patient when she presented to the ED.  STUDIES:   CT Angio: IMPRESSION:1. Study was limited by considerable patient respiratory motion.With this limitation in mind there is no evidence of central, lobar or segmental sized pulmonary embolism.2. Severe multilobar pneumonia. Notably, the patient has complete left lower lobe consolidation persists since 01/17/2017. This is highly unusual, and concerning for probable central obstruction. Left lower lobe bronchus is abruptly cut off on axial image  47 of scan series 5 of today's examination, which could be extrinsic or could be related to an endobronchial lesion. Correlation with bronchoscopy should be considered if not recently performed. New small to moderate parapneumonic pleural effusion, predominantly located sub pulmonic. 3. Interval development of multiple sclerotic osseous lesions. This is highly unusual. The possibility of metastatic disease to the bone should be considered.    CULTURES:   ANTIBIOTICS: Vanc 1/20 >> Cefepime 1/20 >>  SIGNIFICANT EVENTS: Bronchoscopy biopsy result 1/17 Immunohistochemistry shows the tumor is strongly positive for Napsin A and thyroid transcription. factor-1 (TTF-1) and negative with cytokeratin 5/6. The immunophenotype is consistent with primary lung adenocarcinoma. (JDP:ecj 02/25/2017)  1/21 bscopy  LINES/TUBES: ETT 1/21 >>  1/21 bedside ultrasound >>no fluid   SUBJECTIVE:  Low gr febrile Sedated on fent/ propofol   VITAL SIGNS: BP 121/74   Pulse (!) 104   Temp 99.9 F (37.7 C)   Resp (!) 31   Ht 5\' 6"  (1.676 m)   Wt (!) 309 lb 15.5 oz (140.6 kg)   LMP 02/01/2017 (Approximate) Comment: pt shielded  SpO2 98%   BMI 50.03 kg/m   HEMODYNAMICS:    VENTILATOR SETTINGS: Vent Mode: PRVC FiO2 (%):  [50 %-100 %] 50 % Set Rate:  [18 bmp-30 bmp] 30 bmp Vt Set:  [410 mL-480 mL] 410 mL PEEP:  [5 cmH20-10 cmH20] 10 cmH20 Plateau Pressure:  [23 cmH20-34 cmH20] 32 cmH20  INTAKE / OUTPUT:  Intake/Output Summary (Last 24 hours) at 03/02/2017 1015 Last data filed at 03/02/2017 0804 Gross per 24 hour  Intake 1755.93 ml  Output 235 ml  Net 1520.93 ml     PHYSICAL EXAMINATION: General  Young obese  aaf  Intubated,  sedated.  HENT: NCAT oral ETT. No JVD Pulm: decreased on left. Crackles on right.  Card: RRR  Ext: no edema brisk CR Abd: soft not tender + bowel sounds Neuro: awake, no focal def.   LABS:  BMET Recent Labs  Lab 03/05/2017 0618 03/01/17 0253 03/02/17 0237   NA 141 144 138  K 3.9 3.9 4.1  CL 108 113* 112*  CO2 19* 20* 19*  BUN 10 12 19   CREATININE 0.78 0.65 1.28*  GLUCOSE 106* 88 88    Electrolytes Recent Labs  Lab 02/23/17 1731 02/16/2017 0618 03/01/17 0253 03/02/17 0237  CALCIUM 9.2 9.0 8.6* 8.4*  MG 2.2  --   --  2.3  PHOS 3.4  --   --  4.7*    CBC Recent Labs  Lab 02/09/2017 0618 03/01/17 0253 03/02/17 0237  WBC 15.2* 13.8* 11.8*  HGB 11.0* 9.7* 8.7*  HCT 36.0 31.9* 28.7*  PLT 163 125* 137*    Coag's Recent Labs  Lab 02/23/17 1731  02/25/17 0148 02/25/17 1026 03/02/17 0318  APTT 32   < > 52* 51* 66*  INR 1.46  --   --   --   --    < > = values in this interval not displayed.    Sepsis Markers Recent Labs  Lab 02/23/2017 0712 02/10/2017 1017 03/05/2017 1805 03/01/17 1752  LATICACIDVEN 1.52 1.06  --   --   PROCALCITON  --   --  0.34 0.40    ABG Recent Labs  Lab 03/01/17 0030 03/01/17 1338 03/02/17 0342  PHART 7.364 7.204* 7.272*  PCO2ART 36.5 53.7* 42.2  PO2ART 87.8 150* 114*    Liver Enzymes No results for input(s): AST, ALT, ALKPHOS, BILITOT, ALBUMIN in the last 168 hours.  Cardiac Enzymes No results for input(s): TROPONINI, PROBNP in the last 168 hours.  Glucose No results for input(s): GLUCAP in the last 168 hours.  Imaging Dg Chest Port 1 View  Result Date: 03/02/2017 CLINICAL DATA:  Intubation. EXAM: PORTABLE CHEST 1 VIEW COMPARISON:  03/01/2017. FINDINGS: Endotracheal tube and NG tube in stable position. Progressive diffuse opacification of the left lung is noted. Diffuse right lung infiltrate again noted without change. Left-sided pleural effusion. No pneumothorax. Heart size stable. IMPRESSION: 1.  Lines and tubes in stable position. 2. Progressive diffuse opacification of the left lung. Unchanged diffuse right lung infiltrate. Electronically Signed   By: Marcello Moores  Register   On: 03/02/2017 07:06   Dg Chest Port 1 View  Result Date: 03/01/2017 CLINICAL DATA:  Intubation EXAM: PORTABLE  CHEST 1 VIEW COMPARISON:  Portable exam 1147 hours compared to 0657 hours FINDINGS: Tip of endotracheal tube projects 3.6 cm above carina. Nasogastric tube extends into stomach. Normal heart size. Diffuse BILATERAL pulmonary infiltrates slightly increased in LEFT upper lobe since earlier exam. Question LEFT pleural effusion. No pneumothorax. IMPRESSION: Satisfactory endotracheal tube position. Diffuse BILATERAL pulmonary infiltrates slightly increased in LEFT upper lobe. Electronically Signed   By: Lavonia Dana M.D.   On: 03/01/2017 12:08        DISCUSSION: 37 year old woman recently diagnosed with PE on xarelto, with primary lung adenocarcinoma of the lung causing worsening left atelectasis/ post obstructive pneumonia.   ASSESSMENT / PLAN:  Acute hypoxic respiratory failure post obstructive atelectasis +/- PNA in setting of adenocarcinoma of the lung  -CT chest w/ new multilobar airspace disease ? PNA, also could reflect progression of disease. Has complete L lower lobe consolidation w/ LL lobe bronchus abruptly cut off ?  extrinsic vs endobronchial. Also w/ new development of mult sclerotic osseus lesions.   Plan PRVC  SBTs as tolerated Ct PEEP 10   Primary lung adenocarcinoma:  Plan Heme/onc consulted  Rad Onc to assess Additional biopsies 1/21 sent for tumor markers  HCAP/ Post obs pneumonia Ct vanc and cefepime  Await cx   History of PE:  Plan Change LMWH to IV heparin   AKI  Plan  Ct IVFs, avoid nephrotoxins  GI - start TFs  Anemia of critical illness Plan Trend cbc Transfuse for Hb 7 or lower  Acute encephalopathy -RASS goal -1 -Propofol/ fent gtt  Mom updated at bedside  The patient is critically ill with multiple organ systems failure and requires high complexity decision making for assessment and support, frequent evaluation and titration of therapies, application of advanced monitoring technologies and extensive interpretation of multiple databases.  Critical Care Time devoted to patient care services described in this note independent of APP/resident  time is 35 minutes.   Kara Mead MD. [place] Flood. [facility] Pager [phone] If no response call [phone]    03/02/2017, 10:15 AM

## 2017-03-02 NOTE — Consult Note (Signed)
Radiation Oncology         [phone] ________________________________ INITIAL INPATIENT CONSULTATION  Name: Crystal Gallagher        MRN: [id number]  Date of Service: 03/03/2017 DOB: [birth year]  GL:OVFIEPPIR, Zoe A, MD  No ref. provider found     REFERRING PHYSICIAN: No ref. provider found  DIAGNOSIS: Diagnoses of Shortness of breath, Pleural effusion, Acute respiratory distress, History of ETT, and Acute respiratory failure ([place]) were pertinent to this visit.   HISTORY OF PRESENT ILLNESS: Crystal Gallagher is a 37 y.o. female seen at the request of Dr. Julien Nordmann for newly diagnosed obstructive NSCLC, adenocarcinoma of the LLL.  She is a never smoker with a past medical history significant for asthma which was diagnosed as a child. She initially presented to the emergency department on 10/30/2016 with complaints of shortness of breath, cough and wheezing which were felt secondary to an asthmatic exacerbation. Chest x-ray at that time appeared normal. She was treated symptomatically but did not improve and therefore presented back to the emergency department on 11/10/2016 with worsening cough and shortness of breath. Chest x-ray at that time revealed a left lower lobe opacity suspicious for pneumonia and she was treated with Zithromax for suspected community-acquired pneumonia. Since that time, she has completed multiple rounds of antibiotics and follow-up imaging studies have shown persistent left lower lobe opacity despite treatment.  She was referred for further evaluation with Dr. Charlett Nose pulmonology on 01/12/2017 and following that visit was scheduled for bronchoscopy for further evaluation. Unfortunately the bronchoscopy was canceled due to inclement weather and prior to rescheduling she presented back to the emergency department on 01/17/2017 with increased shortness of breath, chest tightness and cough with syncopal episodes. A CT chest at that time revealed a submassive right pulmonary  embolism with worsening consolidation and collapse of the left lower lobe and patchy left lower lobe pneumonia with persistent lymphadenopathy. She was admitted to the hospital at that time and treated for the PE as well as healthcare acquired pneumonia. She was discharged home on Coumadin with Lovenox bridge and eventually transitioned to Xarelto for the pulmonary embolism. Unfortunately, she developed extensive vaginal bleeding secondary to anticoagulation, requiring transfusion on 02/15/2017 due to a hemoglobin of 8.5. She was transfused 2 units PRBCs and Hgb improved to 9.8 at time of discharge.   She was admitted to the hospital on 02/23/2017 for heparin bridging and planned bronchoscopy which was performed by Dr. Lamonte Sakai on 02/24/2017. Bronchoscopy demonstrated a nonobstructing lesion in the left upper lobe carina extending into the orifice of the left lower lobe, narrowing but not obstructing. Endobronchial biopsy with brushings were obtained and final pathology revealed poorly differentiated adenocarcinoma, consistent with lung primary. Unfortunately, there was not sufficient tissue for molecular testing. She was discharged home on 02/25/2017 in stable condition but unfortunately presented back to the emergency Department on 02/27/2017 due to increasing shortness of breath, fatigue, fevers, chills and intermittent cough with rest colored sputum.  Repeat CTA chest on admission 03/09/2017 was negative for pulmonary embolism but now shows severe multilobar pneumonia with complete consolidation of the left lower lobe with a cut off sign of the left lower lobe bronchus felt most likely secondary to an obstructing endobronchial lesion. There are multiple borderline-enlarged to mildly enlarged mediastinal and bilateral hilar lymph nodes, the largest of which is in the left hilar region measuring 1.7 cm. There was also noted to be interval development of multiple sclerotic osseous lesions within the thoracic spine  indicating likely osseous metastatic disease.  She underwent repeat bronchoscopy by Dr. Nelda Marseille on 03/01/17 for further biopsies for molecular studies. She is currently intubated for acute respiratory failure.  Dr. Julien Nordmann has met with the patient's mother and discussed the need for further imaging studies to complete her staging workup with a CT A/P and brain MRI in the near future, however, the priority is to start treatment as soon as possible to relieve obstruction and open up the airways.  We are asked to see the patient today to discuss the potential role for palliative radiotherapy to relieve the obstruction caused by the left lower lobe lung mass.  PREVIOUS RADIATION THERAPY: No   PAST MEDICAL HISTORY:  Past Medical History:  Diagnosis Date  . Asthma   . 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 Pneumonia   . Pulmonary embolism ([place])        PAST SURGICAL HISTORY: Past Surgical History:  Procedure Laterality Date  . APPENDECTOMY    . BREAST SURGERY  breast reduction  . VIDEO BRONCHOSCOPY Bilateral 02/24/2017   Procedure: VIDEO BRONCHOSCOPY WITH FLUORO;  Surgeon: Collene Gobble, MD;  Location: [facility];  Service: Cardiopulmonary;  Laterality: Bilateral;     FAMILY HISTORY:  Family History  Problem Relation Age of Onset  . Cancer Other   . Hypertension Other   . Arthritis Mother   . Colon cancer Father   . Breast cancer Maternal Aunt   . Colon cancer Paternal Uncle   . Stroke Maternal Grandmother   . Cancer Maternal Grandfather   . Hypertension Maternal Grandfather   . Alzheimer's disease Paternal Grandmother   . Breast cancer Maternal Aunt      SOCIAL HISTORY:  reports that  has never smoked. she has never used smokeless tobacco. She reports that she drinks alcohol. She reports that she does not use drugs. The patient is single and has no children.  She works at the [employer] [place] [employer] [place].  She has no history of smoking, alcohol or drug  abuse.  ALLERGIES: Patient has no known allergies.   MEDICATIONS:  Current Facility-Administered Medications  Medication Dose Route Frequency Provider Last Rate Last Dose  . ceFEPIme (MAXIPIME) 2 g in dextrose 5 % 50 mL IVPB  2 g Intravenous Q8H Lavenia Atlas, RPH   Stopped at 03/01/17 2313  . chlorhexidine gluconate (MEDLINE KIT) (PERIDEX) 0.12 % solution 15 mL  15 mL Mouth Rinse BID Erick Colace, NP   15 mL at 03/02/17 0756  . feeding supplement (VITAL HIGH PROTEIN) liquid 1,000 mL  1,000 mL Per Tube Q24H Kara Mead V, MD      . fentaNYL (SUBLIMAZE) 2,500 mcg in sodium chloride 0.9 % 250 mL (10 mcg/mL) infusion  0-400 mcg/hr Intravenous Continuous Dareen Piano, Nisch[place], MD 17.5 mL/hr at 03/02/17 0600 175 mcg/hr at 03/02/17 0600  . fentaNYL (SUBLIMAZE) bolus via infusion 50 mcg  50 mcg Intravenous Q1H PRN Rush Farmer, MD      . fentaNYL (SUBLIMAZE) injection 100 mcg  100 mcg Intravenous Q15 min PRN Erick Colace, NP      . fentaNYL (SUBLIMAZE) injection 100 mcg  100 mcg Intravenous Q2H PRN Erick Colace, NP      . heparin ADULT infusion 100 units/mL (25000 units/218m sodium chloride 0.45%)  1,750 Units/hr Intravenous Continuous GDorrene German RPH 17.5 mL/hr at 03/02/17 0600 1,750 Units/hr at 03/02/17 0600  . ipratropium-albuterol (DUONEB) 0.5-2.5 (3) MG/3ML nebulizer solution 3  mL  3 mL Nebulization Q4H PRN Thomasene Ripple, MD   3 mL at 03/01/17 0433  . MEDLINE mouth rinse  15 mL Mouth Rinse QID Erick Colace, NP   15 mL at 03/02/17 0316  . midazolam (VERSED) injection 1-2 mg  1-2 mg Intravenous Q1H PRN Rush Farmer, MD   2 mg at 03/01/17 1057  . pantoprazole (PROTONIX) injection 40 mg  40 mg Intravenous Daily Erick Colace, NP   40 mg at 03/02/17 0947  . phenylephrine (NEOSYNEPHRINE) 10-0.9 MG/250ML-% infusion  0-400 mcg/min Intravenous Titrated Anders Simmonds, MD      . propofol (DIPRIVAN) 1000 MG/100ML infusion  5-100 mcg/kg/min Intravenous Titrated  Rush Farmer, MD 42.2 mL/hr at 03/02/17 1050 50 mcg/kg/min at 03/02/17 1050     REVIEW OF SYSTEMS:  Review of systems is unable to be obtained as the patient is currently intubated. However, pertinent positives are noted above in the history of present illness as provided by the patient's mother who provided the history during my visit today.   PHYSICAL EXAM:  Wt Readings from Last 3 Encounters:  02/22/2017 (!) 309 lb 15.5 oz (140.6 kg)  02/24/17 (!) 313 lb (142 kg)  02/22/17 (!) 313 lb 1.6 oz (142 kg)   Temp Readings from Last 3 Encounters:  03/02/17 (!) 100.4 F (38 C)  02/25/17 98.6 F (37 C) (Oral)  02/16/17 98.7 F (37.1 C) (Oral)   BP Readings from Last 3 Encounters:  03/02/17 (!) 136/91  02/25/17 (!) 133/92  02/22/17 105/89   Pulse Readings from Last 3 Encounters:  03/02/17 (!) 112  02/25/17 (!) 110  02/22/17 (!) 131   Pain Assessment Pain Score: 0-No pain/10  In general this is an acutely ill appearing african [place] female, currently intubated and resting comfortably in no acute distress.  HEENT reveals that the patient is normocephalic, atraumatic. PERRLA. Skin is intact without any evidence of gross lesions. Cardiovascular exam reveals a regular rate and rhythm, no clicks rubs or murmurs are auscultated. Pulmonary exam reveals decreased breath sounds on the left with diffuse wheezes and scattered rhonchi bilaterally. Lymphatic assessment is performed and does not reveal any adenopathy in the cervical, supraclavicular, axillary, or inguinal chains. Abdomen has active bowel sounds in all quadrants and is intact. The abdomen is soft, non tender, non distended. Lower extremities are negative for pretibial pitting edema, deep calf tenderness, cyanosis or clubbing.  ECOG = 4  0 - Asymptomatic (Fully active, able to carry on all predisease activities without restriction)  1 - Symptomatic but completely ambulatory (Restricted in physically strenuous activity but  ambulatory and able to carry out work of a light or sedentary nature. For example, light housework, office work)  2 - Symptomatic, <50% in bed during the day (Ambulatory and capable of all self care but unable to carry out any work activities. Up and about more than 50% of waking hours)  3 - Symptomatic, >50% in bed, but not bedbound (Capable of only limited self-care, confined to bed or chair 50% or more of waking hours)  4 - Bedbound (Completely disabled. Cannot carry on any self-care. Totally confined to bed or chair)  5 - Death   Eustace Pen MM, Creech RH, Tormey DC, et al. [phone]). "Toxicity and response criteria of the [facility]". [place] Oncol. 5 (6): 649-55    LABORATORY DATA:  Lab Results  Component Value Date   WBC 11.8 (H) 03/02/2017   HGB 8.7 (L) 03/02/2017  HCT 28.7 (L) 03/02/2017   MCV 93.2 03/02/2017   PLT 137 (L) 03/02/2017   Lab Results  Component Value Date   NA 138 03/02/2017   K 4.1 03/02/2017   CL 112 (H) 03/02/2017   CO2 19 (L) 03/02/2017   Lab Results  Component Value Date   ALT 42 02/15/2017   AST 32 02/15/2017   ALKPHOS 368 (H) 02/15/2017   BILITOT 0.4 02/15/2017      RADIOGRAPHY: Dg Chest 1 View  Result Date: 02/14/2017 CLINICAL DATA:  37 year old female with shortness of breath. EXAM: CHEST 1 VIEW COMPARISON:  Chest radiograph dated 02/15/2017 FINDINGS: There is a small left pleural effusion with left mid to lower lung airspace density which may represent atelectasis versus infiltrate mild diffuse central vascular prominence may represent mild vascular congestion there is a patchy area of airspace opacity in the right upper lung field most concerning for pneumonia. Clinical correlation is recommended. There is no pneumothorax. Stable cardiac silhouette. No acute osseous pathology. IMPRESSION: 1. Left-sided pleural effusion and left lower lobe atelectasis versus infiltrate. 2. Patchy right upper lobe opacity concerning for  pneumonia. Clinical correlation is recommended. 3. Probable mild vascular congestion and mild edema. 4. No pneumothorax. Electronically Signed   By: Anner Crete M.D.   On: 02/22/2017 06:59   Dg Chest 2 View  Result Date: 02/15/2017 CLINICAL DATA:  Six months of cough. History of right-sided pulmonary embolism. History of a left-sided cavitary mass. Asthma. EXAM: CHEST  2 VIEW COMPARISON:  CT scan of the chest of January 17, 2017 and chest x-ray of the same day FINDINGS: The right lung is adequately inflated. The interstitial markings on the right are coarse. On the left there is persistent mild volume loss with increased density in the mid and lower lung. Probable persistent left pleural effusion. The heart and pulmonary vascularity are normal. IMPRESSION: Persistent parenchymal consolidation in the mid and lower left lung with probable pleural effusion. Slight interval increase in the conspicuity of the interstitial markings of the right lung may reflect mild interstitial edema. Repeat chest CT scanning is recommended. Electronically Signed   By: David  Martinique M.D.   On: 02/15/2017 11:49   Dg Abd 1 View  Result Date: 02/06/2017 CLINICAL DATA:  Right groin pain. EXAM: ABDOMEN - 1 VIEW COMPARISON:  None. FINDINGS: The bowel gas pattern is normal. No unexpected radio-opaque calculi or other significant radiographic abnormality are seen. Phleboliths noted over the anatomic pelvis bilaterally. IMPRESSION: Negative. Electronically Signed   By: Misty Stanley M.D.   On: 02/06/2017 13:16   Ct Angio Chest Pe W And/or Wo Contrast  Result Date: 03/04/2017 CLINICAL DATA:  37 year old female with recent history of pulmonary embolism in December 2018. Shortness of breath. EXAM: CT ANGIOGRAPHY CHEST WITH CONTRAST TECHNIQUE: Multidetector CT imaging of the chest was performed using the standard protocol during bolus administration of intravenous contrast. Multiplanar CT image reconstructions and MIPs were obtained  to evaluate the vascular anatomy. CONTRAST:  181m ISOVUE-370 IOPAMIDOL (ISOVUE-370) INJECTION 76% COMPARISON:  Chest CT 01/17/2017. FINDINGS: Comment: Today's study is limited by considerable patient respiratory motion. Cardiovascular: No central, lobar or segmental sized filling defects are noted in the pulmonary arterial tree to suggest clinically relevant pulmonary embolism. Smaller subsegmental size filling defects cannot be entirely excluded secondary to respiratory motion. Heart size is borderline enlarged. There is no significant pericardial fluid, thickening or pericardial calcification. No significant aortic atherosclerosis. No coronary artery calcifications. Mediastinum/Nodes: Multiple borderline enlarged and mildly enlarged mediastinal and  bilateral hilar lymph nodes, largest of which is in the left hilar region measuring 1.7 cm in short axis. Esophagus is unremarkable in appearance. No axillary lymphadenopathy. Lungs/Pleura: Persistent complete consolidation of the left lower lobe. Abrupt cut off of the left lower lobe bronchus noted on axial image 47 of series 5. Small to moderate left parapneumonic pleural effusion, increased compared to the prior study, predominantly located in the subpulmonic position. Widespread patchy interstitial and airspace disease scattered throughout the lungs bilaterally, most confluent in the right upper lobe, significantly worsened compared to the prior examination. Upper Abdomen: Unremarkable. Musculoskeletal: Multiple developing areas of sclerosis throughout the visualized axial and appendicular skeleton, new compared to the prior study from 01/17/2017, concerning for potential metastatic disease. A good example of this is a new 12 mm lesion in the left side of the T11 vertebral body (axial image 79 of series 8). Review of the MIP images confirms the above findings. IMPRESSION: 1. Study was limited by considerable patient respiratory motion. With this limitation in mind  there is no evidence of central, lobar or segmental sized pulmonary embolism. 2. Severe multilobar pneumonia. Notably, the patient has complete left lower lobe consolidation persists since 01/17/2017. This is highly unusual, and concerning for probable central obstruction. Left lower lobe bronchus is abruptly cut off on axial image 47 of scan series 5 of today's examination, which could be extrinsic or could be related to an endobronchial lesion. Correlation with bronchoscopy should be considered if not recently performed. New small to moderate parapneumonic pleural effusion, predominantly located sub pulmonic. 3. Interval development of multiple sclerotic osseous lesions. This is highly unusual. The possibility of metastatic disease to the bone should be considered. Electronically Signed   By: Vinnie Langton M.D.   On: 02/22/2017 09:53   Dg Chest Port 1 View  Result Date: 03/02/2017 CLINICAL DATA:  Intubation. EXAM: PORTABLE CHEST 1 VIEW COMPARISON:  03/01/2017. FINDINGS: Endotracheal tube and NG tube in stable position. Progressive diffuse opacification of the left lung is noted. Diffuse right lung infiltrate again noted without change. Left-sided pleural effusion. No pneumothorax. Heart size stable. IMPRESSION: 1.  Lines and tubes in stable position. 2. Progressive diffuse opacification of the left lung. Unchanged diffuse right lung infiltrate. Electronically Signed   By: Marcello Moores  Register   On: 03/02/2017 07:06   Dg Chest Port 1 View  Result Date: 03/01/2017 CLINICAL DATA:  Intubation EXAM: PORTABLE CHEST 1 VIEW COMPARISON:  Portable exam 1147 hours compared to 0657 hours FINDINGS: Tip of endotracheal tube projects 3.6 cm above carina. Nasogastric tube extends into stomach. Normal heart size. Diffuse BILATERAL pulmonary infiltrates slightly increased in LEFT upper lobe since earlier exam. Question LEFT pleural effusion. No pneumothorax. IMPRESSION: Satisfactory endotracheal tube position. Diffuse  BILATERAL pulmonary infiltrates slightly increased in LEFT upper lobe. Electronically Signed   By: Lavonia Dana M.D.   On: 03/01/2017 12:08   Dg Chest Port 1 View  Result Date: 03/01/2017 CLINICAL DATA:  Pleural effusion EXAM: PORTABLE CHEST 1 VIEW COMPARISON:  Chest radiograph from one day prior. FINDINGS: Slightly right rotated chest radiograph. Stable cardiomediastinal silhouette with top-normal heart size. No pneumothorax. No pleural effusion. Stable small left pleural effusion. No right pleural effusion. Stable patchy right upper lung opacity and left lung base consolidation. IMPRESSION: 1. Stable patchy right upper lung and left lung base consolidation, compatible with multilobar pneumonia. Please see the chest CT report from 1 day prior for further details and recommendations. 2. Small left pleural effusion, stable. Electronically Signed  By: Ilona Sorrel M.D.   On: 03/01/2017 08:04   US Pelvic Complete With Transvaginal  Result Date: 02/06/2017 CLINICAL DATA:  Abnormal uterine bleeding.  IUD.  Fibroids. EXAM: TRANSABDOMINAL AND TRANSVAGINAL ULTRASOUND OF PELVIS TECHNIQUE: Both transabdominal and transvaginal ultrasound examinations of the pelvis were performed. Transabdominal technique was performed for global imaging of the pelvis including uterus, ovaries, adnexal regions, and pelvic cul-de-sac. It was necessary to proceed with endovaginal exam following the transabdominal exam to visualize the IUD and ovaries. COMPARISON:  None FINDINGS: Uterus Measurements: 10.5 x 6.3 x 9.9 cm. A large subserosal fibroid is seen in the left posterior corpus 7.1 cm in maximum diameter. A second fibroid is seen in the left anterior corpus measuring 2.1 cm. Endometrium Thickness: 24 mm. Heterogeneous appearance, but no focal abnormality visualized. No IUD visualized. Right ovary Measurements: 3.4 x 1.7 x 2.5 cm. Normal appearance/no adnexal mass. Left ovary Measurements: 3.3 x 1.8 x 1.8 cm. Normal appearance/no  adnexal mass. Other findings No abnormal free fluid. IMPRESSION: Two uterine fibroids, largest measuring 7.1 cm. Thickened heterogeneous endometrium measuring 24 mm. No IUD visualized. Normal appearance of both ovaries.  No adnexal mass identified. Electronically Signed   By: Earle Gell M.D.   On: 02/06/2017 12:19       IMPRESSION/PLAN: 28. 37 year old female with newly diagnosed stage IV, metastatic NSCLC, adenocarcinoma of the LLL with obstructing left lower lobe lung mass with complete consolidation of the left lower lobe and at least partially left upper lobe as well as evidence of osseous metastatic disease in the thoracic spine. Today, I talked to the patient's mother about the findings and workup thus far. We discussed the natural history of metastatic lung cancer and general treatment, highlighting the role of palliative radiotherapy in the management. The recommendation is for a course of 10 daily treatments to the left lower lobe lung mass to help shrink the tumor and relieve the obstruction. We discussed the available radiation techniques, and focused on the details of logistics and delivery. We reviewed the anticipated acute and late sequelae associated with radiation in this setting. Her mother was encouraged to ask questions that were answered to her satisfaction.  At the end of her conversation, her mother freely signs written consent to proceed with palliative radiotherapy and a copy of this document was placed in the patient's medical record. She will undergo CT simulation today at 2 PM with plans to begin treatment tomorrow.  The above documentation reflects my direct findings during this shared patient visit. Please see the separate note by Dr. Lisbeth Renshaw on this date for the remainder of the patient's plan of care.    Nicholos Johns, PA-C

## 2017-03-02 NOTE — Telephone Encounter (Signed)
Called and spoke to [employer] regarding [employer]. Offered moral support Pt is being scheduled for radiation.

## 2017-03-02 NOTE — Telephone Encounter (Signed)
Copied from [place]. Topic: General - Other >> Mar 02, 2017  9:42 AM Valla Leaver wrote: Reason for CRM: Mother calling to notify that patient is at [facility] long intubated and diagnosed w/ lung cancer and heavily sedated in intensive care. She will begin radiation soon. She just wanted to make Dr. Nolon Rod aware.

## 2017-03-03 ENCOUNTER — Inpatient Hospital Stay (HOSPITAL_COMMUNITY): Payer: 59

## 2017-03-03 ENCOUNTER — Ambulatory Visit
Admit: 2017-03-03 | Discharge: 2017-03-03 | Disposition: A | Discharge status: Home / Self Care | Payer: 59 | Attending: Radiation Oncology | Admitting: Radiation Oncology

## 2017-03-03 ENCOUNTER — Ambulatory Visit: Payer: 59 | Admitting: Family Medicine

## 2017-03-03 DIAGNOSIS — J9600 Acute respiratory failure, unspecified whether with hypoxia or hypercapnia: Secondary | ICD-10-CM

## 2017-03-03 LAB — PHOSPHORUS: PHOSPHORUS: 4.2 mg/dL (ref 2.5–4.6)

## 2017-03-03 LAB — BASIC METABOLIC PANEL
Anion gap: 8 (ref 5–15)
BUN: 24 mg/dL — ABNORMAL HIGH (ref 6–20)
CALCIUM: 8.7 mg/dL — AB (ref 8.9–10.3)
CO2: 21 mmol/L — ABNORMAL LOW (ref 22–32)
CREATININE: 0.96 mg/dL (ref 0.44–1.00)
Chloride: 113 mmol/L — ABNORMAL HIGH (ref 101–111)
GFR calc Af Amer: >60 mL/min (ref >60)
GFR calc non Af Amer: >60 mL/min (ref >60)
Glucose, Bld: 93 mg/dL (ref 65–99)
Potassium: 4.1 mmol/L (ref 3.5–5.1)
SODIUM: 142 mmol/L (ref 135–145)

## 2017-03-03 LAB — GLUCOSE, CAPILLARY
GLUCOSE-CAPILLARY: 106 mg/dL — AB (ref 65–99)
GLUCOSE-CAPILLARY: 108 mg/dL — AB (ref 65–99)
GLUCOSE-CAPILLARY: 96 mg/dL (ref 65–99)
Glucose-Capillary: 84 mg/dL (ref 65–99)
Glucose-Capillary: 87 mg/dL (ref 65–99)
Glucose-Capillary: 89 mg/dL (ref 65–99)
Glucose-Capillary: 107 mg/dL — ABNORMAL HIGH (ref 65–99)

## 2017-03-03 LAB — CBC
HCT: 27.9 % — ABNORMAL LOW (ref 36.0–46.0)
Hemoglobin: 8.6 g/dL — ABNORMAL LOW (ref 12.0–15.0)
MCH: 28.6 pg (ref 26.0–34.0)
MCHC: 30.8 g/dL (ref 30.0–36.0)
MCV: 92.7 fL (ref 78.0–100.0)
PLATELETS: 148 10*3/uL — AB (ref 150–400)
RBC: 3.01 MIL/uL — ABNORMAL LOW (ref 3.87–5.11)
RDW: 15.2 % (ref 11.5–15.5)
WBC: 12 10*3/uL — ABNORMAL HIGH (ref 4.0–10.5)

## 2017-03-03 LAB — HEPARIN LEVEL (UNFRACTIONATED): Heparin Unfractionated: 0.57 IU/mL (ref 0.30–0.70)

## 2017-03-03 LAB — MAGNESIUM: MAGNESIUM: 2.4 mg/dL (ref 1.7–2.4)

## 2017-03-03 LAB — VANCOMYCIN, RANDOM: Vancomycin Rm: 13

## 2017-03-03 MED ORDER — VITAL HIGH PROTEIN PO LIQD
1000.0000 mL | ORAL | Status: DC
  Filled 2017-03-03: qty 1000

## 2017-03-03 MED ORDER — ADULT MULTIVITAMIN LIQUID CH
15.0000 mL | Freq: Every day | ORAL | Status: DC
  Administered 2017-03-03 – 2017-03-07 (×5): 15 mL
  Filled 2017-03-03 (×6): qty 15

## 2017-03-03 MED ORDER — VANCOMYCIN HCL IN DEXTROSE 1-5 GM/200ML-% IV SOLN
1000.0000 mg | Freq: Once | INTRAVENOUS | Status: AC
Start: 2017-03-03 — End: 2017-03-03
  Administered 2017-03-03: 1000 mg via INTRAVENOUS
  Filled 2017-03-03: qty 200

## 2017-03-03 NOTE — Progress Notes (Signed)
[place] for lovenox >>heparin Indication: history of PE on xarelto PTA   No Known Allergies  Patient Measurements: Height: 5\' 6"  (167.6 cm) Weight: (!) 309 lb 15.5 oz (140.6 kg) IBW/kg (Calculated) : 59.3   Vital Signs: Temp: 99.5 F (37.5 C) (01/23 0200) Temp Source: Core (01/23 0000) BP: 124/69 (01/23 0415) Pulse Rate: 105 (01/23 0415)  Labs: Recent Labs    03/01/17 0253  03/02/17 0237 03/02/17 0318 03/02/17 1056 03/02/17 1058 03/02/17 2105 03/03/17 0257  HGB 9.7*  --  8.7*  --   --   --   --  8.6*  HCT 31.9*  --  28.7*  --   --   --   --  27.9*  PLT 125*  --  137*  --   --   --   --  148*  APTT  --   --   --  66* 60*  --   --   --   HEPARINUNFRC  --    < > 0.93*  --   --  0.70 0.57 0.57  CREATININE 0.65  --  1.28*  --   --   --   --  0.96   < > = values in this interval not displayed.    Estimated Creatinine Clearance: 115.1 mL/min (by C-G formula based on SCr of 0.96 mg/dL).   Medical History: Past Medical History:  Diagnosis Date  . Asthma   . 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 Pneumonia   . Pulmonary embolism [facility])      Assessment: 37 y.o. female with a history of right PE on xarelto prior to admission, last dose on 1/19 at 1630. Patient was changed to lovenox 1/20, received dose in the am ~0845. New orders to change to heparin.  Today, 03/03/2017  Heparin level therapeutic (0.57) on heparin drip at 1750 units/hr (Therapeutic x 2 )  Per discussion with RN, some bleeding noted with oral suctioning, CCM aware and monitoring and no infusion issues  Goal of Therapy:  Heparin level 0.3-0.7 Monitor platelets by anticoagulation protocol: Yes   Plan:  Continue heparin at 1750 units/hr Daily heparin level and CBC   Dorrene German 03/03/2017 4:45 AM

## 2017-03-03 NOTE — Progress Notes (Signed)
Nutrition Follow-up  DOCUMENTATION CODES:   Morbid obesity  INTERVENTION:  - Continue 60 mL Prostat BID and 30 mL Prostat once/day (5 packets/day). This regimen + kcal from current Propofol rate provides 1614 kcal (109% maximum estimated kcal need) and 75 grams of protein (51% estimated protein need). - Will order 15 mL liquid multivitamin per OGT/day.  - Will monitor Propofol rate and add TF formula when able.    NUTRITION DIAGNOSIS:   Inadequate oral intake related to inability to eat as evidenced by NPO status. -ongoing  GOAL:   Provide needs based on ASPEN/SCCM guidelines -ongoing  MONITOR:   Vent status, TF tolerance, Weight trends, Labs  REASON FOR ASSESSMENT:   Consult Enteral/tube feeding initiation and management  ASSESSMENT:   37 year old female with recent diagnosis of adenocarcinoma of the lung presenting with bilateral pneumonia and respiratory failure.   Pt remains intubated and sedated. Propofol rate this AM consistent with rate from yesterday. No TF formula ordered yesterday, only Prostat d/t high rate of Propofol and calorie provision from sedation. Spoke with Velna Hatchet (PCCM NP) and plan is for assessment of Propofol wean and to reassess concerning nutrition support tomorrow.   Patient is currently intubated on ventilator support MV: 12.5 L/min Temp (24hrs), Avg:99.8 F (37.7 C), Min:99 F (37.2 C), Max:100.4 F (38 C) Propofol: 42.2 ml/hr (1114 kcal) BP: 124/75 and MAP: 86  Medications reviewed. Labs reviewed; CBGs: 84, 87, and 106 mg/dL this AM, Cl: 113 mmol/L, BUN: 24 mg/dL, Ca: 8.7 mg/dL.  Drips: Propofol @ 50 mcg/kg/min, Fentanyl @ 175 mcg/hr, Heparin @ 1750 units/hr.       Diet Order:  Diet NPO time specified  EDUCATION NEEDS:   Not appropriate for education at this time  Skin:  Skin Assessment: Reviewed RN Assessment  Last BM:  1/18 (PTA)  Height:   Ht Readings from Last 1 Encounters:  03/01/17 5\' 6"  (1.676 m)    Weight:   Wt  Readings from Last 1 Encounters:  02/21/2017 (!) 309 lb 15.5 oz (140.6 kg)    Ideal Body Weight:  59.1 kg  BMI:  Body mass index is 50.03 kg/m.  Estimated Nutritional Needs:   Kcal:  7903-8333  Protein:  > 148 grams  Fluid:  > 1.3 L      Jarome Matin, MS, RD, LDN, [facility] Inpatient Clinical Dietitian Pager # [phone] After hours/weekend pager # [phone]

## 2017-03-03 NOTE — Progress Notes (Signed)
   03/03/17 0900  Clinical Encounter Type  Visited With Patient not available  Visit Type Follow-up;Psychological support;Spiritual support;Critical Care  Referral From Chaplain  Consult/Referral To Chaplain  Spiritual Encounters  Spiritual Needs Other (Comment) (Patient not available and family not in room)  Stress Factors  Patient Stress Factors Not reviewed  Family Stress Factors Not reviewed   I went by the patient's room to follow-up from meeting the mother the previous day. They patient was unable to speak with me and there weren't any family members present.   Please, contact Spiritual Care for further assistance.  Chaplain Shanon Ace M.Div., [facility]

## 2017-03-03 NOTE — Progress Notes (Signed)
PULMONARY / CRITICAL CARE MEDICINE   Name: Crystal Gallagher MRN: [id number] DOB: Jun 23, 1980    ADMISSION DATE:  02/13/2017 CONSULTATION DATE:  1/20  REFERRING MD:  Dr Dareen Piano  CHIEF COMPLAINT:  Concern for left pleural effusion , progressive dyspnea   HISTORY OF PRESENT ILLNESS: 37 yo woman with history of asthma, pneumonia, PE, lifetime nonsmoker, who presented 1/20 for progressively worsening dyspnea & progressive LLL atelectasis requiring mechanical ventilation  She underwent a CT angio of the chest on 12/9 which showed PE predominant on the right side. She was started on anticoagulation and transitioned to xarelto. AC course was complicated by uterine bleeding that resolved. CT imaging from 01/17/17 revealed progression of LLL infiltrate and an apparent cutoff sign.  She was admitted 1/15 for planned bronchoscopy with IV heparin bridge for anticoagulation.  The patient was taken for bronchoscopy on 4/09 which was complicated by persistent coughing and difficult to obtain tissue sampling. An area of erythema, edema was observed in the L lower lobe. A non-obstructing and friable submucosal lesion was identified at the LUL carina that extended into LLL. This endobronchial mass did not obstruct the airway and was biopsied during the procedure. 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 The results were not returned to the patient when she presented to the ED.   SUBJECTIVE: RN reports no acute events.  Pt remains sedate on vent.     VITAL SIGNS: BP 131/71   Pulse (!) 102   Temp 99 F (37.2 C)   Resp (!) 30   Ht _0  (1.676 m)   Wt (!) 309 lb 15.5 oz (140.6 kg)   LMP 02/01/2017 (Approximate) Comment: pt shielded  SpO2 99%   BMI 50.03 kg/m   HEMODYNAMICS:    VENTILATOR SETTINGS: Vent Mode: PRVC FiO2 (%):  [40 %-50 %] 40 % Set Rate:  [30 bmp] 30 bmp Vt Set:  [410 mL] 410 mL PEEP:  [10 cmH20] 10 cmH20 Plateau  Pressure:  [21 cmH20-29 cmH20] 21 cmH20  INTAKE / OUTPUT:  Intake/Output Summary (Last 24 hours) at 03/03/2017 0835 Last data filed at 03/03/2017 0730 Gross per 24 hour  Intake 2090.98 ml  Output 1425 ml  Net 665.98 ml     PHYSICAL EXAMINATION: General: obese female lying in bed on vent, appears ill   HEENT: MM pink/moist, ETT, thick secretions from mouth Neuro: sedate CV: s1s2 rrr, no m/r/g PULM: even/non-labored, lungs bilaterally coarse with rhonchi, L>R WJ:XBJY, non-tender, bsx4 active  Extremities: warm/dry, no edema  Skin: no rashes or lesions  LABS:  BMET Recent Labs  Lab 03/01/17 0253 03/02/17 0237 03/03/17 0257  NA 144 138 142  K 3.9 4.1 4.1  CL 113* 112* 113*  CO2 20* 19* 21*  BUN 12 19 24*  CREATININE 0.65 1.28* 0.96  GLUCOSE 88 88 93    Electrolytes Recent Labs  Lab 03/01/17 0253 03/02/17 0237 03/03/17 0257  CALCIUM 8.6* 8.4* 8.7*  MG  --  2.3 2.4  PHOS  --  4.7* 4.2    CBC Recent Labs  Lab 03/01/17 0253 03/02/17 0237 03/03/17 0257  WBC 13.8* 11.8* 12.0*  HGB 9.7* 8.7* 8.6*  HCT 31.9* 28.7* 27.9*  PLT 125* 137* 148*    Coag's Recent Labs  Lab 02/25/17 1026 03/02/17 0318 03/02/17 1056  APTT 51* 66* 60*    Sepsis Markers Recent Labs  Lab 02/13/2017 0712 02/17/2017 1017 02/14/2017 1805 03/01/17 1752  LATICACIDVEN 1.52 1.06  --   --  PROCALCITON  --   --  0.34 0.40    ABG Recent Labs  Lab 03/01/17 0030 03/01/17 1338 03/02/17 0342  PHART 7.364 7.204* 7.272*  PCO2ART 36.5 53.7* 42.2  PO2ART 87.8 150* 114*    Liver Enzymes No results for input(s): AST, ALT, ALKPHOS, BILITOT, ALBUMIN in the last 168 hours.  Cardiac Enzymes No results for input(s): TROPONINI, PROBNP in the last 168 hours.  Glucose Recent Labs  Lab 03/02/17 2119 03/02/17 2308 03/03/17 0318 03/03/17 0320 03/03/17 0739  GLUCAP 91 90 84 87 106*    Imaging Dg Chest Port 1 View  Result Date: 03/03/2017 CLINICAL DATA:  Respiratory failure EXAM:  PORTABLE CHEST 1 VIEW COMPARISON:  03/02/2017 FINDINGS: Endotracheal tube and nasogastric catheter are noted in satisfactory position. Cardiac shadow is stable. Diffuse bilateral opacities are noted left greater than right. Mild left pleural effusion is noted as well. No bony abnormality is seen. IMPRESSION: Stable appearance of the chest when compared with the prior exam. Electronically Signed   By: Inez Catalina M.D.   On: 03/03/2017 07:05    STUDIES:  Bronchoscopy biopsy 1/17 >> Immunohistochemistry shows the tumor is strongly positive for Napsin A and thyroid transcription. factor-1 (TTF-1) and negative with cytokeratin 5/6. The immunophenotype is consistent with primary lung adenocarcinoma. (JDP:ecj 02/25/2017)  CT Angio 1/20 >> 1. Study was limited by considerable patient respiratory motion.With this limitation in mind there is no evidence of central, lobar or segmental sized pulmonary embolism.2. Severe multilobar pneumonia. Notably, the patient has complete left lower lobe consolidation persists since 01/17/2017. This is highly unusual, and concerning for probable central obstruction.  Left lower lobe bronchus is abruptly cut off on axial image 47 of scan series 5 of today's examination, which could be extrinsic or could be related to an endobronchial lesion. Correlation with bronchoscopy should be considered if not recently performed. New small to moderate parapneumonic pleural effusion, predominantly located sub pulmonic. 3. Interval development of multiple sclerotic osseous lesions. This is highly unusual. The possibility of metastatic disease to the bone should be considered. Bedside US 1/21 >> no fluid   CULTURES: BC x2 1/20 >>  RVP 1/20 >> negative  BAL 1/21 >>   ANTIBIOTICS: Vanc 1/20 >> 1/23 Cefepime 1/20 >>  SIGNIFICANT EVENTS: 1/20  Admit  1/21  Bronchoscopy  LINES/TUBES: ETT 1/21 >>   DISCUSSION: 37 year old woman recently diagnosed with PE on xarelto, with primary lung  adenocarcinoma of the lung causing worsening left atelectasis/ post obstructive pneumonia. New sclerotic lesions, effusion.    ASSESSMENT / PLAN:  Acute hypoxic respiratory failure post obstructive atelectasis +/- PNA in setting of adenocarcinoma of the lung  -CT chest w/ new multilobar airspace disease ? PNA, also could reflect progression of disease. Has complete L lower lobe consolidation w/ LL lobe bronchus abruptly cut off ? extrinsic vs endobronchial. Also w/ new development of mult sclerotic osseus lesions.  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 P: Appreciate Oncology, Radiation ONC Follow up biopsy from 12/1 for tumor markers  HCAP/ Post Obstructive Pneumonia P: Discontinue Vanco  Continue cefepime  Follow cultures   History of PE:  P: IV Heparin per Pharmacy Monitor for bleeding   AKI  P: Trend BMP / urinary output Replace electrolytes as indicated Avoid nephrotoxic agents, ensure adequate renal  perfusion  At Risk Malnutrition  P: TF per Nutrition  Protein supplementation PPI  Anemia of critical illness P: Trend CBC  Transfuse for Hgb <7%, active bleeding   Acute encephalopathy P: RASS Goal:  0 to -1  Propofol for sedation  Fentanyl gtt for pain    - Family: no family at bedside am 1/23.  Will update on arrival.    Noe Gens, NP-C [facility] Pgr: [phone] or if no answer [phone] 03/03/2017, 8:46 AM

## 2017-03-03 NOTE — Progress Notes (Signed)
Pharmacy Antibiotic Note  Crystal Gallagher is a 37 y.o. female admitted on 02/26/2017 with pneumonia.  Pharmacy has been consulted for Vancomycin and Cefepime dosing. CXR concerning for pna.   Today, 03/03/2017  Scr improving 1.28>0.63 VR=13 mg/L this am ~ 24 hours after LD of Vancomycin ~1100 mg.  Plan:  Will give Vancomycin 1 Gm x1 this am   F/u duration of therapy - consider stopping altogether since MRSA PCR is negative  Height: _0  (167.6 cm) Weight: (!) 309 lb 15.5 oz (140.6 kg) IBW/kg (Calculated) : 59.3  Temp (24hrs), Avg:100.1 F (37.8 C), Min:99.3 F (37.4 C), Max:100.4 F (38 C)  Recent Labs  Lab 02/25/17 0148 03/04/2017 0618 03/05/2017 0712 02/18/2017 1017 03/01/17 0253 03/02/17 0237 03/02/17 1058 03/03/17 0257  WBC 13.5* 15.2*  --   --  13.8* 11.8*  --  12.0*  CREATININE  --  0.78  --   --  0.65 1.28*  --  0.96  LATICACIDVEN  --   --  1.52 1.06  --   --   --   --   VANCOTROUGH  --   --   --   --   --   --  68*  --   VANCORANDOM  --   --   --   --   --   --   --  13    Estimated Creatinine Clearance: 115.1 mL/min (by C-G formula based on SCr of 0.96 mg/dL).    No Known Allergies  Antimicrobials this admission:  1/20 Vancomycin >> 1/20 Cefepime >>  Dose adjustments this admission:  1/22 Hold vancomycin for trough 33 mcg/ml 1/23 VR=13 mg/L  Microbiology results:  1/20 BCx: ngtd 1/20 MRSA PCR: neg 1/20 Respiratory panel: neg 1/21 BAL: no org seen  Thank you for allowing pharmacy to be a part of this patient's care.   Dorrene German 03/03/2017 4:41 AM

## 2017-03-04 ENCOUNTER — Ambulatory Visit: Payer: 59 | Admitting: Family Medicine

## 2017-03-04 ENCOUNTER — Inpatient Hospital Stay (HOSPITAL_COMMUNITY): Payer: 59

## 2017-03-04 ENCOUNTER — Ambulatory Visit
Admit: 2017-03-04 | Discharge: 2017-03-04 | Disposition: A | Discharge status: Home / Self Care | Payer: 59 | Attending: Radiation Oncology | Admitting: Radiation Oncology

## 2017-03-04 LAB — BASIC METABOLIC PANEL
ANION GAP: 8 (ref 5–15)
BUN: 25 mg/dL — ABNORMAL HIGH (ref 6–20)
CHLORIDE: 110 mmol/L (ref 101–111)
CO2: 23 mmol/L (ref 22–32)
Calcium: 9.1 mg/dL (ref 8.9–10.3)
Creatinine, Ser: 0.75 mg/dL (ref 0.44–1.00)
GFR calc non Af Amer: >60 mL/min (ref >60)
Glucose, Bld: 109 mg/dL — ABNORMAL HIGH (ref 65–99)
POTASSIUM: 4.2 mmol/L (ref 3.5–5.1)
SODIUM: 141 mmol/L (ref 135–145)

## 2017-03-04 LAB — CBC
HEMATOCRIT: 28.7 % — AB (ref 36.0–46.0)
Hemoglobin: 8.9 g/dL — ABNORMAL LOW (ref 12.0–15.0)
MCH: 28.3 pg (ref 26.0–34.0)
MCHC: 31 g/dL (ref 30.0–36.0)
MCV: 91.1 fL (ref 78.0–100.0)
PLATELETS: 181 10*3/uL (ref 150–400)
RBC: 3.15 MIL/uL — AB (ref 3.87–5.11)
RDW: 15 % (ref 11.5–15.5)
WBC: 12.4 10*3/uL — ABNORMAL HIGH (ref 4.0–10.5)

## 2017-03-04 LAB — CULTURE, BAL-QUANTITATIVE

## 2017-03-04 LAB — GLUCOSE, CAPILLARY
GLUCOSE-CAPILLARY: 104 mg/dL — AB (ref 65–99)
GLUCOSE-CAPILLARY: 104 mg/dL — AB (ref 65–99)
GLUCOSE-CAPILLARY: 116 mg/dL — AB (ref 65–99)
GLUCOSE-CAPILLARY: 132 mg/dL — AB (ref 65–99)
Glucose-Capillary: 124 mg/dL — ABNORMAL HIGH (ref 65–99)
Glucose-Capillary: 119 mg/dL — ABNORMAL HIGH (ref 65–99)

## 2017-03-04 LAB — TRIGLYCERIDES: Triglycerides: 307 mg/dL — ABNORMAL HIGH (ref <150)

## 2017-03-04 LAB — HEPARIN LEVEL (UNFRACTIONATED): Heparin Unfractionated: 0.43 IU/mL (ref 0.30–0.70)

## 2017-03-04 LAB — CULTURE, BAL-QUANTITATIVE W GRAM STAIN: Culture: NO GROWTH

## 2017-03-04 MED ORDER — PANTOPRAZOLE SODIUM 40 MG PO PACK
40.0000 mg | PACK | Freq: Every day | ORAL | Status: DC
  Administered 2017-03-05 – 2017-03-07 (×3): 40 mg
  Filled 2017-03-04 (×3): qty 20

## 2017-03-04 MED ORDER — DEXMEDETOMIDINE HCL IN NACL 200 MCG/50ML IV SOLN
0.4000 ug/kg/h | INTRAVENOUS | Status: DC
  Administered 2017-03-04 (×3): 1 ug/kg/h via INTRAVENOUS
  Administered 2017-03-04: 0.6 ug/kg/h via INTRAVENOUS
  Administered 2017-03-04: 1.2 ug/kg/h via INTRAVENOUS
  Administered 2017-03-04 (×3): 1 ug/kg/h via INTRAVENOUS
  Administered 2017-03-05: 1.2 ug/kg/h via INTRAVENOUS
  Administered 2017-03-05: 1 ug/kg/h via INTRAVENOUS
  Administered 2017-03-05: 1.2 ug/kg/h via INTRAVENOUS
  Filled 2017-03-04 (×2): qty 100
  Filled 2017-03-04 (×11): qty 50

## 2017-03-04 MED ORDER — ACETAMINOPHEN 325 MG PO TABS
650.0000 mg | ORAL_TABLET | Freq: Four times a day (QID) | ORAL | Status: DC | PRN
  Administered 2017-03-05 – 2017-03-07 (×6): 650 mg via ORAL
  Filled 2017-03-04 (×7): qty 2

## 2017-03-04 MED ORDER — SENNOSIDES-DOCUSATE SODIUM 8.6-50 MG PO TABS
1.0000 | ORAL_TABLET | Freq: Every day | ORAL | Status: DC
  Administered 2017-03-04 – 2017-03-07 (×4): 1 via ORAL
  Filled 2017-03-04 (×4): qty 1

## 2017-03-04 MED ORDER — ENOXAPARIN SODIUM 150 MG/ML ~~LOC~~ SOLN
150.0000 mg | Freq: Two times a day (BID) | SUBCUTANEOUS | Status: DC
  Administered 2017-03-04 – 2017-03-06 (×6): 150 mg via SUBCUTANEOUS
  Filled 2017-03-04 (×7): qty 1

## 2017-03-04 MED ORDER — VITAL HIGH PROTEIN PO LIQD
1000.0000 mL | ORAL | Status: DC
  Administered 2017-03-04 – 2017-03-07 (×4): 1000 mL
  Filled 2017-03-04 (×5): qty 1000

## 2017-03-04 NOTE — Progress Notes (Signed)
PULMONARY / CRITICAL CARE MEDICINE   Name: Crystal Gallagher MRN: [id number] DOB: 1980-11-06    ADMISSION DATE:  02/19/2017 CONSULTATION DATE:  1/20  REFERRING MD:  Dr Dareen Piano  CHIEF COMPLAINT:  Concern for left pleural effusion , progressive dyspnea   HISTORY OF PRESENT ILLNESS: 37 yo woman with history of asthma, pneumonia, PE, lifetime nonsmoker, who presented 1/20 for progressively worsening dyspnea & progressive LLL atelectasis requiring mechanical ventilation  She underwent a CT angio of the chest on 12/9 which showed PE predominant on the right side. She was started on anticoagulation and transitioned to xarelto. AC course was complicated by uterine bleeding that resolved. CT imaging from 01/17/17 revealed progression of LLL infiltrate and an apparent cutoff sign.  She was admitted 1/15 for planned bronchoscopy with IV heparin bridge for anticoagulation.  The patient was taken for bronchoscopy on 5/17 which was complicated by persistent coughing and difficult to obtain tissue sampling. An area of erythema, edema was observed in the L lower lobe. A non-obstructing and friable submucosal lesion was identified at the LUL carina that extended into LLL. This endobronchial mass did not obstruct the airway and was biopsied during the procedure. 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 The results were not returned to the patient when she presented to the ED.   SUBJECTIVE:  RN reports concern for foley not working, planning to change out.  Remains on propofol / fentanyl.     VITAL SIGNS: BP 128/89   Pulse (!) 116   Temp 98.4 F (36.9 C)   Resp (!) 34   Ht _0  (1.676 m)   Wt (!) 321 lb 14 oz (146 kg)   LMP 02/01/2017 (Approximate) Comment: pt shielded  SpO2 100%   BMI 51.95 kg/m   HEMODYNAMICS:    VENTILATOR SETTINGS: Vent Mode: PRVC FiO2 (%):  [40 %] 40 % Set Rate:  [30 bmp] 30 bmp Vt Set:  [410 mL] 410  mL PEEP:  [10 cmH20] 10 cmH20 Plateau Pressure:  [19 cmH20-26 cmH20] 25 cmH20  INTAKE / OUTPUT:  Intake/Output Summary (Last 24 hours) at 03/04/2017 1004 Last data filed at 03/04/2017 0900 Gross per 24 hour  Intake 2414.18 ml  Output 1105 ml  Net 1309.18 ml     PHYSICAL EXAMINATION: General: obese female lying in bed on vent  HEENT: MM pink/moist, no jvd Neuro: sedate  CV: s1s2 rrr, no m/r/g PULM: even/non-labored, lungs bilaterally with scattered rhonchi, improved aeration  OH:YWVP, non-tender, bsx4 active  Extremities: warm/dry, no overt edema  Skin: no rashes or lesions, tattoos  LABS:  BMET Recent Labs  Lab 03/02/17 0237 03/03/17 0257 03/04/17 0456  NA 138 142 141  K 4.1 4.1 4.2  CL 112* 113* 110  CO2 19* 21* 23  BUN 19 24* 25*  CREATININE 1.28* 0.96 0.75  GLUCOSE 88 93 109*    Electrolytes Recent Labs  Lab 03/02/17 0237 03/03/17 0257 03/04/17 0456  CALCIUM 8.4* 8.7* 9.1  MG 2.3 2.4  --   PHOS 4.7* 4.2  --     CBC Recent Labs  Lab 03/02/17 0237 03/03/17 0257 03/04/17 0456  WBC 11.8* 12.0* 12.4*  HGB 8.7* 8.6* 8.9*  HCT 28.7* 27.9* 28.7*  PLT 137* 148* 181    Coag's Recent Labs  Lab 02/25/17 1026 03/02/17 0318 03/02/17 1056  APTT 51* 66* 60*    Sepsis Markers Recent Labs  Lab 02/15/2017 0712 02/15/2017 1017 02/15/2017 1805 03/01/17 1752  LATICACIDVEN 1.52 1.06  --   --   PROCALCITON  --   --  0.34 0.40    ABG Recent Labs  Lab 03/01/17 0030 03/01/17 1338 03/02/17 0342  PHART 7.364 7.204* 7.272*  PCO2ART 36.5 53.7* 42.2  PO2ART 87.8 150* 114*    Liver Enzymes No results for input(s): AST, ALT, ALKPHOS, BILITOT, ALBUMIN in the last 168 hours.  Cardiac Enzymes No results for input(s): TROPONINI, PROBNP in the last 168 hours.  Glucose Recent Labs  Lab 03/03/17 1125 03/03/17 1558 03/03/17 1925 03/03/17 2303 03/04/17 0304 03/04/17 0740  GLUCAP 108* 96 89 107* 104* 104*    Imaging Dg Chest Port 1 View  Result  Date: 03/04/2017 CLINICAL DATA:  Acute respiratory failure with hypoxia EXAM: PORTABLE CHEST 1 VIEW COMPARISON:  Yesterday FINDINGS: Endotracheal tube tip at the clavicular heads. An orogastric tube reaches the stomach. Stable cardiomediastinal contours when accounting for distortion by rightward rotation. Haziness of the left more than right chest, combination of pneumonia pneumonia, left pleural fluid, and left lower lobe carcinoma. IMPRESSION: 1. Stable positioning of endotracheal and orogastric tubes. 2. Left lung mass and pneumonia without detected change. Electronically Signed   By: Monte Fantasia M.D.   On: 03/04/2017 07:05    STUDIES:  Bronchoscopy biopsy 1/17 >> Immunohistochemistry shows the tumor is strongly positive for Napsin A and thyroid transcription. factor-1 (TTF-1) and negative with cytokeratin 5/6. The immunophenotype is consistent with primary lung adenocarcinoma. (JDP:ecj 02/25/2017)  CT Angio 1/20 >> 1. Study was limited by considerable patient respiratory motion.With this limitation in mind there is no evidence of central, lobar or segmental sized pulmonary embolism.2. Severe multilobar pneumonia. Notably, the patient has complete left lower lobe consolidation persists since 01/17/2017. This is highly unusual, and concerning for probable central obstruction.  Left lower lobe bronchus is abruptly cut off on axial image 47 of scan series 5 of today's examination, which could be extrinsic or could be related to an endobronchial lesion. Correlation with bronchoscopy should be considered if not recently performed. New small to moderate parapneumonic pleural effusion, predominantly located sub pulmonic. 3. Interval development of multiple sclerotic osseous lesions. This is highly unusual. The possibility of metastatic disease to the bone should be considered. Bedside US 1/21 >> no fluid   CULTURES: BC x2 1/20 >>  RVP 1/20 >> negative  BAL 1/21 >>   ANTIBIOTICS: Vanc 1/20 >>  1/23 Cefepime 1/20 >>  SIGNIFICANT EVENTS: 1/20  Admit  1/21  Bronchoscopy  LINES/TUBES: ETT 1/21 >>   DISCUSSION: 37 year old woman recently diagnosed with PE on xarelto, with primary lung adenocarcinoma of the lung causing worsening left atelectasis/ post obstructive pneumonia. New sclerotic lesions, effusion.    ASSESSMENT / PLAN:  Acute hypoxic respiratory failure post obstructive atelectasis +/- PNA in setting of adenocarcinoma of the lung  -CT chest w/ new multilobar airspace disease ? PNA, also could reflect progression of disease. Has complete L lower lobe consolidation w/ LL lobe bronchus abruptly cut off ? extrinsic vs endobronchial. Also w/ new development of mult sclerotic osseus lesions.  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 P: Oncology, Radiation ONC following Await EGFR  HCAP/ Post Obstructive Pneumonia P: Monitor CXR  Follow cultures  Continue cefepime   History of PE:  P: Transition to Lovenox  Monitor for bleeding  AKI  P: Trend BMP / urinary output Replace electrolytes as indicated Avoid nephrotoxic agents, ensure adequate renal perfusion  At Risk Malnutrition  P: Begin TF with transition off propofol  Protein supplementation  PPI per OGT   Anemia of critical illness P: Trend CBC  Monitor for bleeding   Acute encephalopathy P: RASS Goal:  0 to -1  Change to precedex for sedation  Discontinue propofol Fentanyl gtt for pain   Hx Abnormal Uterine Bleeding on Anticoagulation - resolved spontaneously, concern for possible lost IUD  P: Will need GYN follow up as outpatient for possible IUD dislodgement  Monitor for further bleeding  - Family: Mother updated per Dr. Elsworth Soho at bedside am 1/24   Noe Gens, NP-C [place] Pgr: [phone] or if no answer [phone] 03/04/2017,  10:04 AM

## 2017-03-04 NOTE — Progress Notes (Addendum)
ANTICOAGULATION CONSULT NOTE  Pharmacy Consult for lovenox>>heparin Indication: history of PE on xarelto PTA   No Known Allergies  Patient Measurements: Height: 5\' 6"  (167.6 cm) Weight: (!) 321 lb 14 oz (146 kg) IBW/kg (Calculated) : 59.3   Vital Signs: Temp: 98.4 F (36.9 C) (01/24 0600) BP: 130/79 (01/24 0600) Pulse Rate: 116 (01/24 0339)  Labs: Recent Labs    03/02/17 0237 03/02/17 0318 03/02/17 1056  03/02/17 2105 03/03/17 0257 03/04/17 0456  HGB 8.7*  --   --   --   --  8.6* 8.9*  HCT 28.7*  --   --   --   --  27.9* 28.7*  PLT 137*  --   --   --   --  148* 181  APTT  --  66* 60*  --   --   --   --   HEPARINUNFRC 0.93*  --   --    < > 0.57 0.57 0.43  CREATININE 1.28*  --   --   --   --  0.96 0.75   < > = values in this interval not displayed.    Estimated Creatinine Clearance: 141.5 mL/min (by C-G formula based on SCr of 0.75 mg/dL).   Medical History: Past Medical History:  Diagnosis Date  . Asthma   . 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 Pneumonia   . Pulmonary embolism [facility])      Assessment: 37 y.o. female with a history of right PE on xarelto prior to admission, last dose on 1/19 at 1630. Patient was changed to lovenox 1/20, received dose in the am ~0845. New orders to change to heparin.  Today, 03/04/2017  Heparin level therapeutic (0.43) on heparin drip at 1750 units/hr   CBC: Hgb remains low but stable, Plts now improved to wnl  On 1/22, RT noted some dark red secretions during oral suctioning; now pink. CCM aware and monitoring.  No IV infusion issues reported  Goal of Therapy:  Heparin level 0.3-0.7 Monitor platelets by anticoagulation protocol: Yes   Plan:  Continue heparin at 1750 units/hr Daily heparin level and CBC   Peggyann Juba, PharmD, BCPS Pager: [phone] 03/04/2017 6:58 AM   Addendum: Consulted to convert IV heparin to SQ lovenox Stop heparin drip, then in 1 hour start lovenox 1mg /kg (150mg ) SQ s12h No  further dosing adjustment needed Pharmacy will sign off note writing, but will continue to follow with CCM  Peggyann Juba, PharmD, BCPS 03/04/2017 10:39 AM

## 2017-03-04 NOTE — Progress Notes (Signed)
Nutrition Follow-up  DOCUMENTATION CODES:   Morbid obesity  INTERVENTION:  - Will change TF regimen: 60 mL Prostat BID with Vital High Protein @ 45 mL/hr. This regimen will provide 1480 kcal, 154 grams of protein, and 903 mL free water.  - PEPuP protocol.  NUTRITION DIAGNOSIS:   Inadequate oral intake related to inability to eat as evidenced by NPO status. -ongoing  GOAL:   Provide needs based on ASPEN/SCCM guidelines -unmet for protein at this time  MONITOR:   Vent status, TF tolerance, Weight trends, Labs  ASSESSMENT:   37 year old female with recent diagnosis of adenocarcinoma of the lung presenting with bilateral pneumonia and respiratory failure.  Significant Events: 1/22- simulation for radiation 1/24- turning off Propofol/switching sedation   Pt remains intubated with OGT in place. Order in place for 5 packets Prostat/day with 15 mL liquid multivitamin per OGT/day. RN providing these items at time of RD visit this AM. RN reports plan to turn off Propofol later this AM. Will adjust TF regimen as outlined above with anticipation of this. Weight +12 lbs/5.4 kg since 1/20; suspect that this is fluid related.   Patient is currently intubated on ventilator support MV: 13.5 L/min Temp (24hrs), Avg:99 F (37.2 C), Min:96.6 F (35.9 C), Max:100 F (37.8 C) Propofol: 33.7 ml/hr (890 kcal)--plan to stop this later this AM BP: 130/78 and MAP: 90  Medications reviewed; 15 mL liquid multivitamin per OGT/day, 40 mg Protonix per OGT/day.  Labs reviewed; CBGs: 104 x2 mg/dL this AM, BUN: 25 mg/dL.   Drips: Propofol @ 40 mcg/kg/min, Fentanyl @ 100 mcg/hr, Heparin @ 1750 units/hr.      Diet Order:  Diet NPO time specified  EDUCATION NEEDS:   Not appropriate for education at this time  Skin:  Skin Assessment: Reviewed RN Assessment  Last BM:  1/18  Height:   Ht Readings from Last 1 Encounters:  03/01/17 5\' 6"  (1.676 m)    Weight:   Wt Readings from Last 1  Encounters:  03/04/17 (!) 321 lb 14 oz (146 kg)    Ideal Body Weight:  59.1 kg  BMI:  Body mass index is 51.95 kg/m.  Estimated Nutritional Needs:   Kcal:  2831-5176  Protein:  > 148 grams  Fluid:  > 1.3 L      Jarome Matin, MS, RD, LDN, [facility] Inpatient Clinical Dietitian Pager # [phone] After hours/weekend pager # [phone]

## 2017-03-04 NOTE — Progress Notes (Signed)
[facility] Therapy Treatment Record Phone [phone]   Radiation Therapy was administered to Crystal Gallagher on: 03/04/2017  12:29 PM and was treatment # 2 out of a planned course of 10 treatments.  Radiation Treatment  1). Beam Photons 10-19 MeV  2). Brachytherapy None  3). Stereotactic Radiosurgery None  4). Other Radiation None     Ketrick Matney, Deberah Adolf, RT (T)

## 2017-03-04 NOTE — Progress Notes (Signed)
No urine output over night. Attempted to flush foley with 10 cc NS; Significant resistance noted. Foley began leaking; Saturated bed pad. Deflated balloon and repositioned foley; Continued to leak. Will change foley and continue to monitor closely.

## 2017-03-04 NOTE — Progress Notes (Signed)
Heparin stopped at 1041 per order. Urine now blood tinged w/ sediment; Small amount of bloody secretions w/ oral suction.  NP made aware. Will continue to monitor closely.

## 2017-03-05 ENCOUNTER — Inpatient Hospital Stay (HOSPITAL_COMMUNITY): Payer: 59

## 2017-03-05 ENCOUNTER — Encounter: Payer: Self-pay | Admitting: Radiation Oncology

## 2017-03-05 ENCOUNTER — Ambulatory Visit
Admit: 2017-03-05 | Discharge: 2017-03-05 | Disposition: A | Discharge status: Home / Self Care | Payer: 59 | Attending: Radiation Oncology | Admitting: Radiation Oncology

## 2017-03-05 LAB — BASIC METABOLIC PANEL
Anion gap: 7 (ref 5–15)
BUN: 42 mg/dL — AB (ref 6–20)
CALCIUM: 9.1 mg/dL (ref 8.9–10.3)
CHLORIDE: 112 mmol/L — AB (ref 101–111)
CO2: 22 mmol/L (ref 22–32)
Creatinine, Ser: 0.93 mg/dL (ref 0.44–1.00)
GFR calc non Af Amer: >60 mL/min (ref >60)
Glucose, Bld: 131 mg/dL — ABNORMAL HIGH (ref 65–99)
Potassium: 4.5 mmol/L (ref 3.5–5.1)
SODIUM: 141 mmol/L (ref 135–145)

## 2017-03-05 LAB — GLUCOSE, CAPILLARY
GLUCOSE-CAPILLARY: 118 mg/dL — AB (ref 65–99)
GLUCOSE-CAPILLARY: 109 mg/dL — AB (ref 65–99)
Glucose-Capillary: 115 mg/dL — ABNORMAL HIGH (ref 65–99)
Glucose-Capillary: 115 mg/dL — ABNORMAL HIGH (ref 65–99)
Glucose-Capillary: 117 mg/dL — ABNORMAL HIGH (ref 65–99)
Glucose-Capillary: 140 mg/dL — ABNORMAL HIGH (ref 65–99)

## 2017-03-05 LAB — CULTURE, BLOOD (ROUTINE X 2)
CULTURE: NO GROWTH
Culture: NO GROWTH
SPECIAL REQUESTS: ADEQUATE
Special Requests: ADEQUATE

## 2017-03-05 LAB — MAGNESIUM: Magnesium: 2.4 mg/dL (ref 1.7–2.4)

## 2017-03-05 LAB — CBC
HEMATOCRIT: 27.3 % — AB (ref 36.0–46.0)
Hemoglobin: 8.4 g/dL — ABNORMAL LOW (ref 12.0–15.0)
MCH: 28 pg (ref 26.0–34.0)
MCHC: 30.8 g/dL (ref 30.0–36.0)
MCV: 91 fL (ref 78.0–100.0)
Platelets: 210 10*3/uL (ref 150–400)
RBC: 3 MIL/uL — ABNORMAL LOW (ref 3.87–5.11)
RDW: 14.8 % (ref 11.5–15.5)
WBC: 12.6 10*3/uL — AB (ref 4.0–10.5)

## 2017-03-05 LAB — PHOSPHORUS: PHOSPHORUS: 3.8 mg/dL (ref 2.5–4.6)

## 2017-03-05 MED ORDER — FUROSEMIDE 10 MG/ML IJ SOLN
40.0000 mg | Freq: Once | INTRAMUSCULAR | Status: AC
  Administered 2017-03-05: 40 mg via INTRAVENOUS
  Filled 2017-03-05: qty 4

## 2017-03-05 MED ORDER — DEXMEDETOMIDINE HCL IN NACL 400 MCG/100ML IV SOLN
0.4000 ug/kg/h | INTRAVENOUS | Status: DC
  Administered 2017-03-05: 0.9 ug/kg/h via INTRAVENOUS
  Administered 2017-03-05: 1.1 ug/kg/h via INTRAVENOUS
  Administered 2017-03-05: 1.2 ug/kg/h via INTRAVENOUS
  Administered 2017-03-05: 0.9 ug/kg/h via INTRAVENOUS
  Administered 2017-03-05 – 2017-03-06 (×3): 1.2 ug/kg/h via INTRAVENOUS
  Administered 2017-03-06: 0.9 ug/kg/h via INTRAVENOUS
  Administered 2017-03-06: 1 ug/kg/h via INTRAVENOUS
  Filled 2017-03-05 (×6): qty 100
  Filled 2017-03-05: qty 200
  Filled 2017-03-05 (×2): qty 100

## 2017-03-05 NOTE — Progress Notes (Signed)
OG tube at the pylorus RN tube retracted external measurement is 46cm.  Tube feedings restarted.

## 2017-03-05 NOTE — Progress Notes (Signed)
Nutrition Follow-up  DOCUMENTATION CODES:   Morbid obesity  INTERVENTION:  Continue Vital High Protein @ 45 mL/hr with 60 mL Prostat BID.   NUTRITION DIAGNOSIS:   Inadequate oral intake related to inability to eat as evidenced by NPO status. -ongoing  GOAL:   Provide needs based on ASPEN/SCCM guidelines -met with current TF regimen.  MONITOR:   Vent status, TF tolerance, Weight trends, Labs  ASSESSMENT:   37 year old female with recent diagnosis of adenocarcinoma of the lung presenting with bilateral pneumonia and respiratory failure.  Significant Events: 1/22- simulation for radiation 1/23- first radiation treatment 1/24- turning off Propofol/switching sedation 1/25- possible aspiration and OGT dislodged; re-advanced and TF to restart   Pt remains intubated with OGT in place. Order for Vital High Protein @ 45 mL/hr with 60 mL Prostat BID. This regimen provides 1480 kcal, 154 grams of protein, and 903 mL free water. Weight continues to trend up and is now +21 lbs/9.2 kg since admission. Per PCCM team notes today, Ogt re-advanced and okay to re-start TF today. Pt going to radiation at this time; TF on hold during this. Also per notes, pt did not tolerate vent weaning for long and may need an early trach.   Patient is currently intubated on ventilator support MV: 11.5 L/min Temp (24hrs), Avg:100.5 F (38.1 C), Min:99.1 F (37.3 C), Max:101.5 F (38.6 C)   Medications reviewed; 40 mg IV Lasix x1 dose today, 15 mL liquid multivitamin/day, 40 mg Protonix/day, 1 tablet Senokot/day.  Labs reviewed; CBGs: 115 and 117 mg/dL, Cl: 112 mmol/L, BUN: 42 mg/dL.  Drips: Fentanyl @ 150 mcg/hr, Precedex @ 0.7 mcg/kg/hr.       Diet Order:  Diet NPO time specified  EDUCATION NEEDS:   Not appropriate for education at this time  Skin:  Skin Assessment: Reviewed RN Assessment  Last BM:  1/18  Height:   Ht Readings from Last 1 Encounters:  03/01/17 5' 6" (1.676 m)    Weight:    Wt Readings from Last 1 Encounters:  03/05/17 (!) 330 lb 4 oz (149.8 kg)    Ideal Body Weight:  59.1 kg  BMI:  Body mass index is 53.3 kg/m.  Estimated Nutritional Needs:   Kcal:  3903-0092  Protein:  > 148 grams  Fluid:  > 1.3 L      Jarome Matin, MS, RD, LDN, [facility] Inpatient Clinical Dietitian Pager # [phone] After hours/weekend pager # [phone]

## 2017-03-05 NOTE — Progress Notes (Signed)
[facility] Therapy Treatment Record Phone [phone]   Radiation Therapy was administered to Crystal Gallagher on: 03/05/2017  12:38 PM and was treatment # 3 out of a planned course of 10 treatments.  Radiation Treatment  1). Beam photons with 6-10 energy  2). Brachytherapy None  3). Stereotactic Radiosurgery None  4). Other Radiation None     Bennett Scrape, RT (T)

## 2017-03-05 NOTE — Progress Notes (Signed)
Pharmacy Antibiotic Note  Crystal Gallagher is a 37 y.o. female admitted on 02/19/2017 with post-obstructive pneumonia.  Pharmacy has been consulted for Cefepime dosing.   Today, 03/05/2017   D6 abx  CXR + persistent PNA, pt remains intubated  Renal function improved to baseline, NCrCl ~85m/min  Tm 101.60F, Tc 99.77F  WBC persistently elevated- 12.6  Plan:  Continue Cefepime 2gm IV q8h  No dose adjustment anticipated, pharmacy to sign off & monitor peripherally  Height: _0  (167.6 cm) Weight: (!) 330 lb 4 oz (149.8 kg) IBW/kg (Calculated) : 59.3  Temp (24hrs), Avg:100.4 F (38 C), Min:98.4 F (36.9 C), Max:101.5 F (38.6 C)  Recent Labs  Lab 02/11/2017 0712 02/26/2017 1017 03/01/17 0253 03/02/17 0237 03/02/17 1058 03/03/17 0257 03/04/17 0456 03/05/17 0307  WBC  --   --  13.8* 11.8*  --  12.0* 12.4* 12.6*  CREATININE  --   --  0.65 1.28*  --  0.96 0.75 0.93  LATICACIDVEN 1.52 1.06  --   --   --   --   --   --   VANCOTROUGH  --   --   --   --  366  --   --   --   VANCORANDOM  --   --   --   --   --  13  --   --     Estimated Creatinine Clearance: 123.7 mL/min (by C-G formula based on SCr of 0.93 mg/dL).    No Known Allergies  Antimicrobials this admission:  1/20 Vancomycin >>1/23 1/20 Cefepime >>  Dose adjustments this admission:  1/22 Hold vancomycin for trough 33 mcg/ml 1/23 VR=13 mg/L  Microbiology results:  1/20 BCx: ngtd 1/20 MRSA PCR: neg 1/20 Respiratory panel: neg 1/21 BAL: no org seen  Thank you for allowing pharmacy to be a part of this patient's care.  LBiagio Borg1/25/2019 8:59 AM

## 2017-03-05 NOTE — Progress Notes (Signed)
PULMONARY / CRITICAL CARE MEDICINE   Name: Crystal Gallagher MRN: [id number] DOB: 08/06/1980    ADMISSION DATE:  03/04/2017 CONSULTATION DATE:  1/20  REFERRING MD:  Dr Dareen Piano  CHIEF COMPLAINT:  Concern for left pleural effusion , progressive dyspnea   HISTORY OF PRESENT ILLNESS: 37 yo woman with history of asthma, pneumonia, PE, lifetime nonsmoker, who presented 1/20 for progressively worsening dyspnea & progressive LLL atelectasis requiring mechanical ventilation  She underwent a CT angio of the chest on 12/9 which showed PE predominant on the right side. She was started on anticoagulation and transitioned to xarelto. AC course was complicated by uterine bleeding that resolved. CT imaging from 01/17/17 revealed progression of LLL infiltrate and an apparent cutoff sign.  She was admitted 1/15 for planned bronchoscopy with IV heparin bridge for anticoagulation.  The patient was taken for bronchoscopy on 3/81 which was complicated by persistent coughing and difficult to obtain tissue sampling. An area of erythema, edema was observed in the L lower lobe. A non-obstructing and friable submucosal lesion was identified at the LUL carina that extended into LLL. This endobronchial mass did not obstruct the airway and was biopsied during the procedure. 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 The results were not returned to the patient when she presented to the ED.   SUBJECTIVE:   RN reports pt failed SBT with tachypnea.  Precedex gtt turned off. Fentanyl gtt increased to 379mg.  Mother at bedside.  NGT noted to be dislodged.     VITAL SIGNS: BP (!) 141/94   Pulse 72   Temp 99.1 F (37.3 C) (Core (Comment))   Resp (!) 25   Ht _0  (1.676 m)   Wt (!) 330 lb 4 oz (149.8 kg)   LMP 02/01/2017 (Approximate) Comment: pt shielded  SpO2 100%   BMI 53.30 kg/m   HEMODYNAMICS:    VENTILATOR SETTINGS: Vent Mode: PRVC FiO2  (%):  [40 %] 40 % Set Rate:  [16 bmp-30 bmp] 16 bmp Vt Set:  [410 mL] 410 mL PEEP:  [8 cmH20] 8 cmH20 Pressure Support:  [14 cmH20] 14 cmH20 Plateau Pressure:  [22 cmH20-33 cmH20] 33 cmH20  INTAKE / OUTPUT:  Intake/Output Summary (Last 24 hours) at 03/05/2017 0942 Last data filed at 03/05/2017 00175Gross per 24 hour  Intake 2396.9 ml  Output 620 ml  Net 1776.9 ml     PHYSICAL EXAMINATION: General: young adult female, critically ill appearing on vent  HEENT: MM pink/moist, ETT Neuro: sedate, flicker of eye opening with calling her name CV: s1s2 rrr, no m/r/g PULM: even/non-labored, lungs bilaterally coarse  GI: soft, non-tender, bsx4 active  Extremities: warm/dry, no edema  Skin: no rashes or lesions   LABS:  BMET Recent Labs  Lab 03/03/17 0257 03/04/17 0456 03/05/17 0307  NA 142 141 141  K 4.1 4.2 4.5  CL 113* 110 112*  CO2 21* 23 22  BUN 24* 25* 42*  CREATININE 0.96 0.75 0.93  GLUCOSE 93 109* 131*    Electrolytes Recent Labs  Lab 03/02/17 0237 03/03/17 0257 03/04/17 0456 03/05/17 0307  CALCIUM 8.4* 8.7* 9.1 9.1  MG 2.3 2.4  --  2.4  PHOS 4.7* 4.2  --  3.8    CBC Recent Labs  Lab 03/03/17 0257 03/04/17 0456 03/05/17 0307  WBC 12.0* 12.4* 12.6*  HGB 8.6* 8.9* 8.4*  HCT 27.9* 28.7* 27.3*  PLT 148* 181 210    Coag's Recent Labs  Lab 03/02/17 0318 03/02/17 1056  APTT 66* 60*    Sepsis Markers Recent Labs  Lab 03/04/2017 0712 03/09/2017 1017 02/10/2017 1805 03/01/17 1752  LATICACIDVEN 1.52 1.06  --   --   PROCALCITON  --   --  0.34 0.40    ABG Recent Labs  Lab 03/01/17 0030 03/01/17 1338 03/02/17 0342  PHART 7.364 7.204* 7.272*  PCO2ART 36.5 53.7* 42.2  PO2ART 87.8 150* 114*    Liver Enzymes No results for input(s): AST, ALT, ALKPHOS, BILITOT, ALBUMIN in the last 168 hours.  Cardiac Enzymes No results for input(s): TROPONINI, PROBNP in the last 168 hours.  Glucose Recent Labs  Lab 03/04/17 1122 03/04/17 1553  03/04/17 1925 03/04/17 2312 03/05/17 0301 03/05/17 0731  GLUCAP 116* 132* 124* 119* 115* 117*    Imaging Dg Chest Port 1 View  Result Date: 03/05/2017 CLINICAL DATA:  Hypoxia EXAM: PORTABLE CHEST 1 VIEW COMPARISON:  March 04, 2017 chest radiograph and chest CT February 28, 2017 FINDINGS: Endotracheal tube tip is 2.5 cm above the carina. Nasogastric tube tip and side port are below the diaphragm. No pneumothorax. Widespread airspace opacity bilaterally is again noted. There is consolidation in the left lower lobe with left pleural effusion. There is cardiomegaly with pulmonary vascularity grossly normal. No adenopathy is evident by radiography. Sclerotic bony lesion seen on recent CT are less well seen by radiography. IMPRESSION: Widespread airspace opacity consistent with extensive pneumonia bilaterally. Consolidation in the left lower lobe is due to pleural effusion coupled with airspace disease. A mass in this area is not well seen but must be of concern given recent CT appearance. Tube positions as described without pneumothorax. Sclerotic bony lesions seen on CT are not well seen by radiography. Adenopathy seen on CT is not convincingly seen on this portable radiographic examination. Electronically Signed   By: Lowella Grip III M.D.   On: 03/05/2017 07:31    STUDIES:  Bronchoscopy biopsy 1/17 >> Immunohistochemistry shows the tumor is strongly positive for Napsin A and thyroid transcription. factor-1 (TTF-1) and negative with cytokeratin 5/6. The immunophenotype is consistent with primary lung adenocarcinoma. (JDP:ecj 02/25/2017)  CT Angio 1/20 >> 1. Study was limited by considerable patient respiratory motion.With this limitation in mind there is no evidence of central, lobar or segmental sized pulmonary embolism.2. Severe multilobar pneumonia. Notably, the patient has complete left lower lobe consolidation persists since 01/17/2017. This is highly unusual, and concerning for probable  central obstruction.  Left lower lobe bronchus is abruptly cut off on axial image 47 of scan series 5 of today's examination, which could be extrinsic or could be related to an endobronchial lesion. Correlation with bronchoscopy should be considered if not recently performed. New small to moderate parapneumonic pleural effusion, predominantly located sub pulmonic. 3. Interval development of multiple sclerotic osseous lesions. This is highly unusual. The possibility of metastatic disease to the bone should be considered. Bedside US 1/21 >> no fluid   CULTURES: BC x2 1/20 >>  RVP 1/20 >> negative  BAL 1/21 >> negative   ANTIBIOTICS: Vanc 1/20 >> 1/23 Cefepime 1/20 >>  SIGNIFICANT EVENTS: 1/20  Admit  1/21  Bronchoscopy 1/25  Possible aspiration event with dislodged NGT / TF infusing.  Radiation  LINES/TUBES: ETT 1/21 >>   DISCUSSION: 37 year old woman recently diagnosed with PE on xarelto, with primary lung adenocarcinoma of the lung causing worsening left atelectasis/ post obstructive pneumonia. New sclerotic lesions, effusion.    ASSESSMENT / PLAN:  Acute hypoxic respiratory failure  post obstructive atelectasis +/- PNA in setting of adenocarcinoma of the lung  -CT chest w/ new multilobar airspace disease ? PNA, also could reflect progression of disease. Has complete L lower lobe consolidation w/ LL lobe bronchus abruptly cut off ? extrinsic vs endobronchial. Also w/ new development of mult sclerotic osseus lesions.  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  Appreciate input  Await EGFR  HCAP/ Post Obstructive Pneumonia Possible Aspiration Event - 1/25, NGT dislodged, TF infusing  P: Monitor CXR for changes Follow cultures  Continue cefepime for  now   History of PE:  P: Continue lovenox  Monitor for bleeding  Would not change to PO anticoagulation until decision regarding trach   AKI  P: Trend BMP / urinary output Replace electrolytes as indicated Avoid nephrotoxic agents, ensure adequate renal perfusion  At Risk Malnutrition  P: Correct placement of NGT, KUB post Continue TF Protein supplement  PPI PO  Senokot-S for bowel regimen  Anemia of critical illness P: Trend CBC Monitor for bleeding  Acute encephalopathy P: RASS Goal:  0 to -1  Continue precedex + fentanyl gtt   Hx Abnormal Uterine Bleeding on Anticoagulation - resolved spontaneously, concern for possible lost IUD  P: Will need GYN follow up as outpatient for possible IUD dislodgement  Monitor for further bleeding  - Family:  Mother updated at bedside 1/25 on NP rounding.    Noe Gens, NP-C [facility] Pgr: [phone] or if no answer [phone] 03/05/2017, 9:42 AM

## 2017-03-05 NOTE — Progress Notes (Signed)
RN informed that the patients OG tube had been pulled and would need to be advanced.  MD notified at bedside and RN advanced OGT at this time.  Order for KUB placed.  Tube feeding held until placement verified.  Will continue to follow up.

## 2017-03-06 ENCOUNTER — Inpatient Hospital Stay (HOSPITAL_COMMUNITY): Payer: 59

## 2017-03-06 LAB — BLOOD GAS, ARTERIAL
ACID-BASE DEFICIT: 4.5 mmol/L — AB (ref 0.0–2.0)
ACID-BASE DEFICIT: 3.1 mmol/L — AB (ref 0.0–2.0)
BICARBONATE: 23.5 mmol/L (ref 20.0–28.0)
Bicarbonate: 23.3 mmol/L (ref 20.0–28.0)
DRAWN BY: 11249
Drawn by: 295031
FIO2: 40
FIO2: 40
O2 SAT: 86 %
O2 SAT: 89.7 %
PATIENT TEMPERATURE: 37.3
PEEP/CPAP: 8 cmH2O
PEEP/CPAP: 8 cmH2O
PH ART: 7.208 — AB (ref 7.350–7.450)
PH ART: 7.271 — AB (ref 7.350–7.450)
Patient temperature: 98.6
RATE: 16 resp/min
RATE: 25 resp/min
VT: 410 mL
VT: 410 mL
pCO2 arterial: 61.5 mmHg — ABNORMAL HIGH (ref 32.0–48.0)
pCO2 arterial: 52.6 mmHg — ABNORMAL HIGH (ref 32.0–48.0)
pO2, Arterial: 62.8 mmHg — ABNORMAL LOW (ref 83.0–108.0)
pO2, Arterial: 66.3 mmHg — ABNORMAL LOW (ref 83.0–108.0)

## 2017-03-06 LAB — BASIC METABOLIC PANEL
Anion gap: 7 (ref 5–15)
BUN: 57 mg/dL — AB (ref 6–20)
CHLORIDE: 112 mmol/L — AB (ref 101–111)
CO2: 23 mmol/L (ref 22–32)
Calcium: 9.2 mg/dL (ref 8.9–10.3)
Creatinine, Ser: 0.95 mg/dL (ref 0.44–1.00)
GFR calc Af Amer: >60 mL/min (ref >60)
GFR calc non Af Amer: >60 mL/min (ref >60)
GLUCOSE: 129 mg/dL — AB (ref 65–99)
POTASSIUM: 4.3 mmol/L (ref 3.5–5.1)
Sodium: 142 mmol/L (ref 135–145)

## 2017-03-06 LAB — CBC
HEMATOCRIT: 27.6 % — AB (ref 36.0–46.0)
Hemoglobin: 8.5 g/dL — ABNORMAL LOW (ref 12.0–15.0)
MCH: 28 pg (ref 26.0–34.0)
MCHC: 30.8 g/dL (ref 30.0–36.0)
MCV: 90.8 fL (ref 78.0–100.0)
PLATELETS: 238 10*3/uL (ref 150–400)
RBC: 3.04 MIL/uL — AB (ref 3.87–5.11)
RDW: 15.2 % (ref 11.5–15.5)
WBC: 12.9 10*3/uL — ABNORMAL HIGH (ref 4.0–10.5)

## 2017-03-06 LAB — GLUCOSE, CAPILLARY
GLUCOSE-CAPILLARY: 107 mg/dL — AB (ref 65–99)
Glucose-Capillary: 122 mg/dL — ABNORMAL HIGH (ref 65–99)
Glucose-Capillary: 125 mg/dL — ABNORMAL HIGH (ref 65–99)
Glucose-Capillary: 99 mg/dL (ref 65–99)
Glucose-Capillary: 107 mg/dL — ABNORMAL HIGH (ref 65–99)
Glucose-Capillary: 106 mg/dL — ABNORMAL HIGH (ref 65–99)

## 2017-03-06 LAB — TRIGLYCERIDES: Triglycerides: 209 mg/dL — ABNORMAL HIGH (ref <150)

## 2017-03-06 MED ORDER — PROPOFOL 1000 MG/100ML IV EMUL
INTRAVENOUS | Status: AC
  Filled 2017-03-06: qty 100

## 2017-03-06 MED ORDER — PROPOFOL 1000 MG/100ML IV EMUL
5.0000 ug/kg/min | INTRAVENOUS | Status: DC
  Administered 2017-03-06: 35 ug/kg/min via INTRAVENOUS
  Administered 2017-03-06: 25 ug/kg/min via INTRAVENOUS
  Administered 2017-03-06: 5 ug/kg/min via INTRAVENOUS
  Administered 2017-03-07: 35 ug/kg/min via INTRAVENOUS
  Administered 2017-03-07: 45 ug/kg/min via INTRAVENOUS
  Administered 2017-03-07 (×2): 35 ug/kg/min via INTRAVENOUS
  Administered 2017-03-07: 55 ug/kg/min via INTRAVENOUS
  Administered 2017-03-07 (×2): 35 ug/kg/min via INTRAVENOUS
  Administered 2017-03-08 (×4): 60 ug/kg/min via INTRAVENOUS
  Filled 2017-03-06 (×15): qty 100

## 2017-03-06 MED ORDER — FUROSEMIDE 10 MG/ML IJ SOLN
40.0000 mg | Freq: Once | INTRAMUSCULAR | Status: AC
  Administered 2017-03-06: 40 mg via INTRAVENOUS
  Filled 2017-03-06: qty 4

## 2017-03-06 MED ORDER — BISACODYL 10 MG RE SUPP
10.0000 mg | Freq: Once | RECTAL | Status: AC
  Administered 2017-03-06: 10 mg via RECTAL
  Filled 2017-03-06: qty 1

## 2017-03-06 MED ORDER — DOCUSATE SODIUM 50 MG/5ML PO LIQD
100.0000 mg | Freq: Two times a day (BID) | ORAL | Status: DC
  Administered 2017-03-06 – 2017-03-07 (×4): 100 mg
  Filled 2017-03-06 (×4): qty 10

## 2017-03-06 NOTE — Progress Notes (Signed)
Patients respirations sustaining in the fifties with Precedex and Fentanyl at max as well as PRN Versed given.  Updated Dr. Elsworth Soho on patients condition received an order for Propofol gtt.  Will continue to monitor.

## 2017-03-06 NOTE — Progress Notes (Signed)
PULMONARY / CRITICAL CARE MEDICINE   Name: Crystal Gallagher MRN: [id number] DOB: Apr 17, 1980    ADMISSION DATE:  02/23/2017 CONSULTATION DATE:  1/20  REFERRING MD:  Dr Dareen Piano  CHIEF COMPLAINT:  Concern for left pleural effusion , progressive dyspnea   HISTORY OF PRESENT ILLNESS: 37 yo woman with history of asthma, pneumonia, PE, lifetime nonsmoker, who presented 1/20 for progressively worsening dyspnea & progressive LLL atelectasis requiring mechanical ventilation  She underwent a CT angio of the chest on 12/9 which showed PE predominant on the right side. She was started on anticoagulation and transitioned to xarelto. AC course was complicated by uterine bleeding that resolved. CT imaging from 01/17/17 revealed progression of LLL infiltrate and an apparent cutoff sign.  She was admitted 1/15 for planned bronchoscopy with IV heparin bridge for anticoagulation.  The patient was taken for bronchoscopy on 2/86 which was complicated by persistent coughing and difficult to obtain tissue sampling. An area of erythema, edema was observed in the L lower lobe. A non-obstructing and friable submucosal lesion was identified at the LUL carina that extended into LLL. This endobronchial mass did not obstruct the airway and was biopsied during the procedure. 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 The results were not returned to the patient when she presented to the ED.   SUBJECTIVE: febrile Sedated on precedex/ fent Remains on PEEP 8   VITAL SIGNS: BP (!) 167/73   Pulse 97   Temp (!) 102 F (38.9 C)   Resp (!) 21   Ht _0  (1.676 m)   Wt (!) 324 lb 11.8 oz (147.3 kg)   LMP 02/01/2017 (Approximate)   SpO2 95%   BMI 52.41 kg/m   HEMODYNAMICS:    VENTILATOR SETTINGS: Vent Mode: PRVC FiO2 (%):  [40 %] 40 % Set Rate:  [16 bmp] 16 bmp Vt Set:  [410 mL] 410 mL PEEP:  [5 cmH20-8 cmH20] 8 cmH20 Plateau Pressure:  [26  cmH20] 26 cmH20  INTAKE / OUTPUT:  Intake/Output Summary (Last 24 hours) at 03/06/2017 0844 Last data filed at 03/06/2017 0800 Gross per 24 hour  Intake 2425.03 ml  Output 2150 ml  Net 275.03 ml     PHYSICAL EXAMINATION: General: young adult female,obese,  critically ill appearing on vent  HEENT: MM pink/moist, ETT Neuro: sedate, not following commands CV: s1s2 rrr, no m/r/g PULM: even/non-labored, lungs bilaterally coarse , decreased on left GI: soft, non-tender, bsx4 active  Extremities: warm/dry, no edema  Skin: no rashes or lesions   LABS:  BMET Recent Labs  Lab 03/04/17 0456 03/05/17 0307 03/06/17 0257  NA 141 141 142  K 4.2 4.5 4.3  CL 110 112* 112*  CO2 _1 BUN 25* 42* 57*  CREATININE 0.75 0.93 0.95  GLUCOSE 109* 131* 129*    Electrolytes Recent Labs  Lab 03/02/17 0237 03/03/17 0257 03/04/17 0456 03/05/17 0307 03/06/17 0257  CALCIUM 8.4* 8.7* 9.1 9.1 9.2  MG 2.3 2.4  --  2.4  --   PHOS 4.7* 4.2  --  3.8  --     CBC Recent Labs  Lab 03/04/17 0456 03/05/17 0307 03/06/17 0257  WBC 12.4* 12.6* 12.9*  HGB 8.9* 8.4* 8.5*  HCT 28.7* 27.3* 27.6*  PLT 181 210 238    Coag's Recent Labs  Lab 03/02/17 0318 03/02/17 1056  APTT 66* 60*    Sepsis Markers Recent Labs  Lab 02/26/2017 0712 02/12/2017 1017 02/23/2017 1805 03/01/17  1752  LATICACIDVEN 1.52 1.06  --   --   PROCALCITON  --   --  0.34 0.40    ABG Recent Labs  Lab 03/01/17 0030 03/01/17 1338 03/02/17 0342  PHART 7.364 7.204* 7.272*  PCO2ART 36.5 53.7* 42.2  PO2ART 87.8 150* 114*    Liver Enzymes No results for input(s): AST, ALT, ALKPHOS, BILITOT, ALBUMIN in the last 168 hours.  Cardiac Enzymes No results for input(s): TROPONINI, PROBNP in the last 168 hours.  Glucose Recent Labs  Lab 03/05/17 1208 03/05/17 1617 03/05/17 1939 03/05/17 2338 03/06/17 0315 03/06/17 0754  GLUCAP 118* 140* 109* 115* 122* 125*    Imaging Dg Chest Port 1 View  Result Date:  03/06/2017 CLINICAL DATA:  Acute respiratory failure. EXAM: PORTABLE CHEST 1 VIEW COMPARISON:  March 05, 2017 FINDINGS: The ETT is in good position. An NG tube terminates below today's film. No pneumothorax. Bilateral pulmonary infiltrates and a probable layering left effusion are similar in the interval. The cardiomediastinal silhouette is stable. IMPRESSION: No interval change in support apparatus or bilateral pulmonary opacities and a probable layering left effusion. Electronically Signed   By: Dorise Bullion III M.D   On: 03/06/2017 07:18   Dg Abd Portable 1v  Result Date: 03/05/2017 CLINICAL DATA:  Nasogastric tube placement. EXAM: PORTABLE ABDOMEN - 1 VIEW COMPARISON:  02/06/2017 abdominal radiograph FINDINGS: Nasogastric tube tip at the pylorus. Gas within the colon without obstructive pattern. No concerning mass effect or gas collection. Opacity at the left base that is known from recent chest CT. IMPRESSION: Nasogastric tube with tip at the pylorus. Electronically Signed   By: Monte Fantasia M.D.   On: 03/05/2017 14:17    STUDIES:  Bronchoscopy biopsy 1/17 >> Immunohistochemistry shows the tumor is strongly positive for Napsin A and thyroid transcription. factor-1 (TTF-1) and negative with cytokeratin 5/6. The immunophenotype is consistent with primary lung adenocarcinoma. (JDP:ecj 02/25/2017)  CT Angio 1/20 >> 1. Study was limited by considerable patient respiratory motion.With this limitation in mind there is no evidence of central, lobar or segmental sized pulmonary embolism.2. Severe multilobar pneumonia. Notably, the patient has complete left lower lobe consolidation persists since 01/17/2017. This is highly unusual, and concerning for probable central obstruction.  Left lower lobe bronchus is abruptly cut off on axial image 47 of scan series 5 of today's examination, which could be extrinsic or could be related to an endobronchial lesion. Correlation with bronchoscopy should be considered  if not recently performed. New small to moderate parapneumonic pleural effusion, predominantly located sub pulmonic. 3. Interval development of multiple sclerotic osseous lesions. This is highly unusual. The possibility of metastatic disease to the bone should be considered. Bedside US 1/21 >> no fluid   CULTURES: BC x2 1/20 >> ng RVP 1/20 >> negative  BAL 1/21 >> negative  resp 1/25 >>  ANTIBIOTICS: Vanc 1/20 >> 1/23 Cefepime 1/20 >>  SIGNIFICANT EVENTS: 1/20  Admit  1/21  Bronchoscopy 1/25  Possible aspiration event with dislodged NGT / TF infusing.  Radiation  LINES/TUBES: ETT 1/21 >>   DISCUSSION: 37 year old woman recently diagnosed with PE on xarelto, with primary lung adenocarcinoma of the lung causing worsening left atelectasis/ post obstructive pneumonia. New sclerotic lesions, effusion.    ASSESSMENT / PLAN:  Acute hypoxic respiratory failure post obstructive atelectasis +/- PNA in setting of adenocarcinoma of the lung  -CT chest w/ new multilobar airspace disease ? PNA, also could reflect progression of disease. Has complete L lower lobe consolidation w/  LL lobe bronchus abruptly cut off ? extrinsic vs endobronchial. Also w/ new development of mult sclerotic osseus lesions.  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  Appreciate input  Await EGFR  HCAP/ Post Obstructive Pneumonia Possible Aspiration Event - 1/25 P: Monitor CXR for changes Follow cultures  Continue cefepime for now   History of PE:  P: Continue lovenox  Monitor for bleeding  Would not change to PO anticoagulation until decision regarding trach   Hx Abnormal Uterine Bleeding on Anticoagulation - resolved spontaneously, concern for possible lost IUD  P: Will need GYN follow up as outpatient for possible IUD  dislodgement  Monitor for further bleeding  AKI  P: Trend BMP / urinary output Replace electrolytes as indicated Avoid nephrotoxic agents, ensure adequate renal perfusion  At Risk Malnutrition  P: Continue TF now that NG corrected Protein supplement  PPI PO  Senokot-S for bowel regimen  Anemia of critical illness P: Trend CBC Monitor for bleeding  Acute encephalopathy P: RASS Goal:  0 to -1  Continue precedex + fentanyl gtt     - Family:  Mother updated at bedside 1/25   The patient is critically ill with multiple organ systems failure and requires high complexity decision making for assessment and support, frequent evaluation and titration of therapies, application of advanced monitoring technologies and extensive interpretation of multiple databases. Critical Care Time devoted to patient care services described in this note independent of APP/resident  time is 35 minutes.  Kara Mead MD. Shade Flood. [facility] Pager [phone] If no response call [phone]     03/06/2017, 8:44 AM

## 2017-03-06 NOTE — Progress Notes (Signed)
Patient continuing to have increased work of breathing updated Dr. Elsworth Soho on patients condition, received an order for stat ABG and chest xray.  Results given to Dr. Elsworth Soho and orders for repeat ABG and  lasix placed. Respirations increased to 25 on ventilator per MD.  Will continue to monitor.

## 2017-03-07 ENCOUNTER — Inpatient Hospital Stay (HOSPITAL_COMMUNITY): Payer: 59

## 2017-03-07 DIAGNOSIS — J90 Pleural effusion, not elsewhere classified: Secondary | ICD-10-CM

## 2017-03-07 LAB — HEPARIN LEVEL (UNFRACTIONATED): HEPARIN UNFRACTIONATED: 1.16 [IU]/mL — AB (ref 0.30–0.70)

## 2017-03-07 LAB — GLUCOSE, CAPILLARY
GLUCOSE-CAPILLARY: 109 mg/dL — AB (ref 65–99)
GLUCOSE-CAPILLARY: 119 mg/dL — AB (ref 65–99)
Glucose-Capillary: 105 mg/dL — ABNORMAL HIGH (ref 65–99)
Glucose-Capillary: 126 mg/dL — ABNORMAL HIGH (ref 65–99)
Glucose-Capillary: 122 mg/dL — ABNORMAL HIGH (ref 65–99)
Glucose-Capillary: 125 mg/dL — ABNORMAL HIGH (ref 65–99)

## 2017-03-07 LAB — CULTURE, RESPIRATORY

## 2017-03-07 LAB — CULTURE, RESPIRATORY W GRAM STAIN: Culture: NO GROWTH

## 2017-03-07 MED ORDER — LACTULOSE 10 GM/15ML PO SOLN
20.0000 g | Freq: Once | ORAL | Status: AC
  Administered 2017-03-07: 20 g
  Filled 2017-03-07: qty 30

## 2017-03-07 MED ORDER — BISACODYL 10 MG RE SUPP
10.0000 mg | Freq: Once | RECTAL | Status: AC
  Administered 2017-03-07: 10 mg via RECTAL
  Filled 2017-03-07: qty 1

## 2017-03-07 MED ORDER — HEPARIN (PORCINE) IN NACL 100-0.45 UNIT/ML-% IJ SOLN
1550.0000 [IU]/h | INTRAMUSCULAR | Status: DC
  Administered 2017-03-07: 1550 [IU]/h via INTRAVENOUS

## 2017-03-07 MED ORDER — HEPARIN (PORCINE) IN NACL 100-0.45 UNIT/ML-% IJ SOLN
1750.0000 [IU]/h | INTRAMUSCULAR | Status: DC
  Administered 2017-03-07: 1750 [IU]/h via INTRAVENOUS
  Filled 2017-03-07 (×2): qty 250

## 2017-03-07 MED ORDER — HEPARIN (PORCINE) IN NACL 100-0.45 UNIT/ML-% IJ SOLN: 1750.0000 [IU]/h | INTRAMUSCULAR | Status: DC

## 2017-03-07 MED ORDER — CHLORHEXIDINE GLUCONATE CLOTH 2 % EX PADS
6.0000 | MEDICATED_PAD | Freq: Every day | CUTANEOUS | Status: DC
  Administered 2017-03-07: 6 via TOPICAL

## 2017-03-07 MED ORDER — SODIUM CHLORIDE 0.9% FLUSH: 10.0000 mL | INTRAVENOUS | Status: DC | PRN

## 2017-03-07 MED ORDER — SODIUM CHLORIDE 0.9 % IV BOLUS (SEPSIS)
1000.0000 mL | Freq: Once | INTRAVENOUS | Status: AC
  Administered 2017-03-07: 1000 mL via INTRAVENOUS

## 2017-03-07 NOTE — Progress Notes (Signed)
ANTICOAGULATION CONSULT NOTE - Follow Consult  Pharmacy Consult for Heparin Indication: pulmonary embolus  No Known Allergies  Patient Measurements: Height: 5\' 6"  (167.6 cm) Weight: (!) 324 lb 4.8 oz (147.1 kg) IBW/kg (Calculated) : 59.3 Heparin Dosing Wt: 95kg  Vital Signs: Temp: 102 F (38.9 C) (01/27 2000) Temp Source: Core (01/27 2000) BP: 108/51 (01/27 2000)  Labs: Recent Labs    03/05/17 0307 03/06/17 0257 03/07/17 1830  HGB 8.4* 8.5*  --   HCT 27.3* 27.6*  --   PLT 210 238  --   HEPARINUNFRC  --   --  1.16*  CREATININE 0.93 0.95  --     Estimated Creatinine Clearance: 119.7 mL/min (by C-G formula based on SCr of 0.95 mg/dL).   Medical History: Past Medical History:  Diagnosis Date  . Asthma   . 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 Pneumonia   . Pulmonary embolism (HCC)     Medications:  Infusions:  . ceFEPime (MAXIPIME) IV 2 g (03/07/17 2102)  . feeding supplement (VITAL HIGH PROTEIN) 1,000 mL (03/07/17 1132)  . fentaNYL infusion INTRAVENOUS 300 mcg/hr (03/07/17 2053)  . heparin 1,750 Units/hr (03/07/17 1029)  . propofol (DIPRIVAN) infusion 45 mcg/kg/min (03/07/17 1952)  on Xarelto PTA- LD 1/19 @ 1630  Assessment: 37 yo F on chronic anticoagulation with Xarelto for hx PE in Dec 2018.  This was held on admission & she was initially started on IV heparin which was switched to Lovenox 1mg /kg q12h on 1/24- last dose was at 2101 on 1/26.  MD is now switching back to IV heparin for thoracentesis planned for today & potential need for trach.  She was previously therapeutic on heparin 1750 units/hr.   No bleeding noted.   Today, 03/07/17  HL is 1.16, supratherapeutic   Goal of Therapy:  Heparin level 0.3-0.7 units/ml Monitor platelets by anticoagulation protocol: Yes   Plan:   Hold heparin for one hour  After one hour start heparin at a reduce rate of 1550 units/hr  Check 8hr heparin level  Daily heparin level & CBC while on  heparin  F/U plans to resume Xarelto once procedures complete  Royetta Asal, PharmD, BCPS Pager [phone] 03/07/2017 9:25 PM

## 2017-03-07 NOTE — Progress Notes (Signed)
Attempted to wean Fent. Drip.  Peak airway pressures elevated, biting on ETT.  Drip increased back to 400 mcg/hr

## 2017-03-07 NOTE — Progress Notes (Signed)
Peripherally Inserted Central Catheter/Midline Placement  The IV Nurse has discussed with the patient and/or persons authorized to consent for the patient, the purpose of this procedure and the potential benefits and risks involved with this procedure.  The benefits include less needle sticks, lab draws from the catheter, and the patient may be discharged home with the catheter. Risks include, but not limited to, infection, bleeding, blood clot (thrombus formation), and puncture of an artery; nerve damage and irregular heartbeat and possibility to perform a PICC exchange if needed/ordered by physician.  Alternatives to this procedure were also discussed.  Bard Power PICC patient education guide, fact sheet on infection prevention and patient information card has been provided to patient /or left at bedside.   Telephone consent obtained from Mother. PICC/Midline Placement Documentation  PICC Double Lumen 03/07/17 PICC Right Cephalic 40 cm 1 cm (Active)  Indication for Insertion or Continuance of Line Prolonged intravenous therapies 03/07/2017 10:07 AM  Exposed Catheter (cm) 1 cm 03/07/2017 10:07 AM  Site Assessment Clean;Dry;Intact 03/07/2017 10:07 AM  Lumen #1 Status Flushed;Saline locked;Blood return noted;Cap changed 03/07/2017 10:07 AM  Lumen #2 Status Flushed;Saline locked;Blood return noted;Cap changed 03/07/2017 10:07 AM  Dressing Type Transparent;Securing device 03/07/2017 10:07 AM  Dressing Status Clean;Intact;Antimicrobial disc in place;Dry 03/07/2017 10:07 AM  Line Care Connections checked and tightened 03/07/2017 10:07 AM  Line Adjustment (NICU/IV Team Only) No 03/07/2017 10:07 AM  Dressing Intervention New dressing 03/07/2017 10:07 AM  Dressing Change Due 03/14/17 03/07/2017 10:07 AM       Aylah Yeary, Weldon Inches 03/07/2017, 10:11 AM

## 2017-03-07 NOTE — Progress Notes (Signed)
[facility] Progress Note Patient Name: Crystal Gallagher DOB: [birth year] MRN: [id number]   Date of Service  03/07/2017  HPI/Events of Note  Multiple issues: Hypoxia - Patient on 60%/P 8 - sat = 90-91%. CXR with total white out of L lung. Thoracentesis planned for tomorrow and 2. Tachycardia - HR = 143 - Likely related to fever and vasodilation. Last LVEF = 60% to 65%.  eICU Interventions  Will order: 1. Bolus with 0.9 NaCl 1 liter IV over 1 hour now. 2. Increase PEEP to 10. 3. Titrate FiO2 to keep sat >= 92%.      Intervention Category Major Interventions: Respiratory failure - evaluation and management;Hypoxemia - evaluation and management;Arrhythmia - evaluation and management  Lysle Dingwall 03/07/2017, 9:49 PM

## 2017-03-07 NOTE — Progress Notes (Signed)
ANTICOAGULATION CONSULT NOTE - Initial Consult  Pharmacy Consult for Heparin Indication: pulmonary embolus  No Known Allergies  Patient Measurements: Height: 5\' 6"  (167.6 cm) Weight: (!) 324 lb 4.8 oz (147.1 kg) IBW/kg (Calculated) : 59.3 Heparin Dosing Wt: 95kg  Vital Signs: Temp: 99.1 F (37.3 C) (01/27 0700) Temp Source: Core (01/27 0400) BP: 138/68 (01/27 0700)  Labs: Recent Labs    03/05/17 0307 03/06/17 0257  HGB 8.4* 8.5*  HCT 27.3* 27.6*  PLT 210 238  CREATININE 0.93 0.95    Estimated Creatinine Clearance: 119.7 mL/min (by C-G formula based on SCr of 0.95 mg/dL).   Medical History: Past Medical History:  Diagnosis Date  . Asthma   . 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 Pneumonia   . Pulmonary embolism (HCC)     Medications:  Infusions:  . ceFEPime (MAXIPIME) IV Stopped (03/07/17 0641)  . feeding supplement (VITAL HIGH PROTEIN) 1,000 mL (03/06/17 1323)  . fentaNYL infusion INTRAVENOUS 400 mcg/hr (03/07/17 0521)  . propofol (DIPRIVAN) infusion 35 mcg/kg/min (03/07/17 0500)  on Xarelto PTA- LD 1/19 @ 1630  Assessment: 37 yo F on chronic anticoagulation with Xarelto for hx PE in Dec 2018.  This was held on admission & she was initially started on IV heparin which was switched to Lovenox 1mg /kg q12h on 1/24- last dose was at 2101 on 1/26.  MD is now switching back to IV heparin for thoracentesis planned for today & potential need for trach.  She was previously therapeutic on heparin 1750 units/hr.   No bleeding noted.   Goal of Therapy:  Heparin level 0.3-0.7 units/ml Monitor platelets by anticoagulation protocol: Yes   Plan:   Start heparin 1750 units/hr (no bolus since received Lovenox dose ~11hrs ago)    Check 8hr heparin level  Daily heparin level & CBC while on heparin  F/U plans to resume Xarelto once procedures complete  Biagio Borg 03/07/2017,8:23 AM

## 2017-03-07 NOTE — Progress Notes (Signed)
PULMONARY / CRITICAL CARE MEDICINE   Name: Crystal Gallagher MRN: [id number] DOB: [birth year]    ADMISSION DATE:  02/17/2017 CONSULTATION DATE:  1/20  REFERRING MD:  Dr Dareen Piano  CHIEF COMPLAINT:  Concern for left pleural effusion , progressive dyspnea   HISTORY OF PRESENT ILLNESS: 37 yo woman with history of asthma, pneumonia, PE, lifetime nonsmoker, who presented 1/20 for progressively worsening dyspnea & progressive LLL atelectasis requiring mechanical ventilation  She underwent a CT angio of the chest on 12/9 which showed PE predominant on the right side. She was started on anticoagulation and transitioned to xarelto. AC course was complicated by uterine bleeding that resolved. CT imaging from 01/17/17 revealed progression of LLL infiltrate and an apparent cutoff sign.  She was admitted 1/15 for planned bronchoscopy with IV heparin bridge for anticoagulation.  The patient was taken for bronchoscopy on 3/43 which was complicated by persistent coughing and difficult to obtain tissue sampling. An area of erythema, edema was observed in the L lower lobe. A non-obstructing and friable submucosal lesion was identified at the LUL carina that extended into LLL. This endobronchial mass did not obstruct the airway and was biopsied during the procedure. 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  TUDIES:  Bronchoscopy biopsy 1/17 >> Immunohistochemistry shows the tumor is strongly positive for Napsin A and thyroid transcription. factor-1 (TTF-1) and negative with cytokeratin 5/6. The immunophenotype is consistent with primary lung adenocarcinoma. (JDP:ecj 02/25/2017)  CT Angio 1/20 >> 1. Study was limited by considerable patient respiratory motion.With this limitation in mind there is no evidence of central, lobar or segmental sized pulmonary embolism.2. Severe multilobar pneumonia. Notably, the patient has complete left lower lobe  consolidation persists since 01/17/2017. This is highly unusual, and concerning for probable central obstruction.  Left lower lobe bronchus is abruptly cut off on axial image 47 of scan series 5 of today's examination, which could be extrinsic or could be related to an endobronchial lesion. Correlation with bronchoscopy should be considered if not recently performed. New small to moderate parapneumonic pleural effusion, predominantly located sub pulmonic. 3. Interval development of multiple sclerotic osseous lesions. This is highly unusual. The possibility of metastatic disease to the bone should be considered. Bedside US 1/21 >> no fluid   CULTURES: BC x2 1/20 >> ng RVP 1/20 >> negative  BAL 1/21 >> negative  resp 1/25 >>  ANTIBIOTICS: Vanc 1/20 >> 1/23 Cefepime 1/20 >>  SIGNIFICANT EVENTS: 1/20  Admit  1/21  Bronchoscopy 1/25  Possible aspiration event with dislodged NGT / TF infusing.  Radiation 1/26 worsening ABG/ lt effusion , changed to propofol gtt  LINES/TUBES: ETT 1/21 >>   SUBJECTIVE: Low gr febrile Sedated on propofol/ fent - RASS -3 Remains on PEEP 8 Good UO with lasix   VITAL SIGNS: BP 138/68   Pulse 86   Temp 99.1 F (37.3 C)   Resp (!) 26   Ht _0  (1.676 m)   Wt (!) 324 lb 4.8 oz (147.1 kg)   LMP 02/01/2017 (Approximate)   SpO2 96%   BMI 52.34 kg/m   HEMODYNAMICS:    VENTILATOR SETTINGS: Vent Mode: PRVC FiO2 (%):  [40 %-60 %] 60 % Set Rate:  [16 bmp-25 bmp] 25 bmp Vt Set:  [410 mL] 410 mL PEEP:  [5 cmH20-8 cmH20] 8 cmH20 Plateau Pressure:  [24 cmH20-31 cmH20] 27 cmH20  INTAKE / OUTPUT:  Intake/Output Summary (Last 24 hours) at 03/07/2017 5686 Last data filed  at 03/07/2017 0700 Gross per 24 hour  Intake 2797 ml  Output 2200 ml  Net 597 ml     PHYSICAL EXAMINATION: General: young adult female,obese,  critically ill appearing on vent  HEENT: MM pink/moist, ETT Neuro: RASS-3 sedate, not following commands CV: s1s2 rrr, no m/r/g PULM:  even/non-labored, lungs bilaterally coarse , decreased on left GI: soft, non-tender, bsx4 active  Extremities: warm/dry, 1+ edema  Skin: no rashes or lesions   LABS:  BMET Recent Labs  Lab 03/04/17 0456 03/05/17 0307 03/06/17 0257  NA 141 141 142  K 4.2 4.5 4.3  CL 110 112* 112*  CO2 _0 BUN 25* 42* 57*  CREATININE 0.75 0.93 0.95  GLUCOSE 109* 131* 129*    Electrolytes Recent Labs  Lab 03/02/17 0237 03/03/17 0257 03/04/17 0456 03/05/17 0307 03/06/17 0257  CALCIUM 8.4* 8.7* 9.1 9.1 9.2  MG 2.3 2.4  --  2.4  --   PHOS 4.7* 4.2  --  3.8  --     CBC Recent Labs  Lab 03/04/17 0456 03/05/17 0307 03/06/17 0257  WBC 12.4* 12.6* 12.9*  HGB 8.9* 8.4* 8.5*  HCT 28.7* 27.3* 27.6*  PLT 181 210 238    Coag's Recent Labs  Lab 03/02/17 0318 03/02/17 1056  APTT 66* 60*    Sepsis Markers Recent Labs  Lab 02/12/2017 1017 02/25/2017 1805 03/01/17 1752  LATICACIDVEN 1.06  --   --   PROCALCITON  --  0.34 0.40    ABG Recent Labs  Lab 03/02/17 0342 03/06/17 1625 03/06/17 1914  PHART 7.272* 7.208* 7.271*  PCO2ART 42.2 61.5* 52.6*  PO2ART 114* 62.8* 66.3*    Liver Enzymes No results for input(s): AST, ALT, ALKPHOS, BILITOT, ALBUMIN in the last 168 hours.  Cardiac Enzymes No results for input(s): TROPONINI, PROBNP in the last 168 hours.  Glucose Recent Labs  Lab 03/06/17 0754 03/06/17 1254 03/06/17 1546 03/06/17 1951 03/06/17 2319 03/07/17 0744  GLUCAP 125* 107* 99 107* 106* 105*    Imaging Dg Chest Port 1 View  Result Date: 03/07/2017 CLINICAL DATA:  Followup ventilator support. EXAM: PORTABLE CHEST 1 VIEW COMPARISON:  03/06/2017 FINDINGS: Endotracheal tube remains well positioned 3 cm above the carina. Nasogastric tube enters the abdomen. Continued radiographic worsening with complete opacification of the left hemithorax. No longer any identifiable aeration of the left lung. Pleural fluid is suspected. Right lung is aerated but shows diffuse  density consistent with edema and/or pneumonia. IMPRESSION: Complete collapse/consolidation of left lung with opacification of the left hemithorax. Likely left effusion. Persistent and worsening diffuse right lung density. Electronically Signed   By: Nelson Chimes M.D.   On: 03/07/2017 06:52   Dg Chest Port 1 View  Result Date: 03/06/2017 CLINICAL DATA:  Tachypnea, asthma, prior history of lung cancer, pulmonary embolism and pneumonia EXAM: PORTABLE CHEST 1 VIEW COMPARISON:  Portable exam 1527 hours compared to 0440 hours FINDINGS: Tip of endotracheal tube projects at superior aspect of the aortic arch. Carina is poorly visualized but tip of ET tube appears to be satisfactorily above carina. Nasogastric tube extends into stomach. Enlargement of cardiac silhouette with vascular congestion. Diffuse BILATERAL pulmonary infiltrates compatible with pulmonary edema or pneumonia. Increased opacification of LEFT hemithorax by combination of pleural effusion and LEFT lung infiltrate versus atelectasis. No pneumothorax. IMPRESSION: Increased LEFT lung opacification by combination of pleural effusion and atelectasis versus infiltrate of LEFT lung. Electronically Signed   By: Lavonia Dana M.D.   On: 03/06/2017 15:50  DISCUSSION: 37 year old woman recently diagnosed with PE on xarelto, with primary lung adenocarcinoma of the lung causing worsening left atelectasis/ post obstructive pneumonia. New sclerotic lesions,left  Effusion worsening & acute resp acidosis.    ASSESSMENT / PLAN:  Acute hypoxic respiratory failure post obstructive atelectasis +/- PNA in setting of adenocarcinoma of the lung  -CT chest w/ new multilobar airspace disease ? PNA, also could reflect progression of disease. Has complete L lower lobe consolidation w/ LL lobe bronchus abruptly cut off ? extrinsic vs endobronchial. Also w/ new development of mult sclerotic osseus lesions.   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  Appreciate input  Await EGFR  HCAP/ Post Obstructive Pneumonia Possible Aspiration Event - 1/25 P: Follow resp cultures  Continue cefepime for now   History of PE:  P: dc lovenox , change to IV heparin for procedure 1/27   Hx Abnormal Uterine Bleeding on Anticoagulation - resolved spontaneously, concern for possible lost IUD  P: Will need GYN follow up as outpatient for possible IUD dislodgement  Monitor for further bleeding  AKI  P: Good  urinary output with lasix, BUN rising ,hold for now Replace electrolytes as indicated Avoid nephrotoxic agents, ensure adequate renal perfusion  Constipation - on high dose fent gtt P: Continue TF  Protein supplement  PPI PO  Senokot-colace for bowel regimen Lactulose 20 x 1  Anemia of critical illness P: Trend CBC Monitor for bleeding  Acute encephalopathy P: RASS Goal:  0 to -1  Continue propofol + fentanyl gtt     - Family:  Mother updated at bedside 1/26   The patient is critically ill with multiple organ systems failure and requires high complexity decision making for assessment and support, frequent evaluation and titration of therapies, application of advanced monitoring technologies and extensive interpretation of multiple databases. Critical Care Time devoted to patient care services described in this note independent of APP/resident  time is 35 minutes.  Kara Mead MD. [place] Flood. [facility] Pager [phone] If no response call [phone]     03/07/2017, 8:21 AM

## 2017-03-08 ENCOUNTER — Ambulatory Visit: Payer: 59

## 2017-03-08 ENCOUNTER — Inpatient Hospital Stay (HOSPITAL_COMMUNITY): Payer: 59

## 2017-03-08 ENCOUNTER — Encounter: Payer: Self-pay | Admitting: Radiation Oncology

## 2017-03-08 DIAGNOSIS — J9600 Acute respiratory failure, unspecified whether with hypoxia or hypercapnia: Secondary | ICD-10-CM

## 2017-03-08 DIAGNOSIS — J90 Pleural effusion, not elsewhere classified: Secondary | ICD-10-CM

## 2017-03-08 DIAGNOSIS — C3432 Malignant neoplasm of lower lobe, left bronchus or lung: Secondary | ICD-10-CM

## 2017-03-08 LAB — CBC
HEMATOCRIT: 28.3 % — AB (ref 36.0–46.0)
Hemoglobin: 8.2 g/dL — ABNORMAL LOW (ref 12.0–15.0)
MCH: 28.1 pg (ref 26.0–34.0)
MCHC: 29 g/dL — AB (ref 30.0–36.0)
MCV: 96.9 fL (ref 78.0–100.0)
PLATELETS: 397 10*3/uL (ref 150–400)
RBC: 2.92 MIL/uL — ABNORMAL LOW (ref 3.87–5.11)
RDW: 15.7 % — AB (ref 11.5–15.5)
WBC: 14.9 10*3/uL — AB (ref 4.0–10.5)

## 2017-03-08 LAB — EPIDERMAL GROWTH FACTOR RECEPTOR (EGFR) MUTATION ANALYSIS

## 2017-03-08 LAB — GLUCOSE, CAPILLARY
GLUCOSE-CAPILLARY: 127 mg/dL — AB (ref 65–99)
GLUCOSE-CAPILLARY: 144 mg/dL — AB (ref 65–99)

## 2017-03-08 LAB — HEPARIN LEVEL (UNFRACTIONATED): Heparin Unfractionated: 0.68 IU/mL (ref 0.30–0.70)

## 2017-03-08 MED ORDER — IBUPROFEN 100 MG/5ML PO SUSP
800.0000 mg | Freq: Three times a day (TID) | ORAL | Status: DC | PRN
  Administered 2017-03-08: 800 mg
  Filled 2017-03-08 (×2): qty 40

## 2017-03-08 MED ORDER — SODIUM BICARBONATE 8.4 % IV SOLN
INTRAVENOUS | Status: DC
  Filled 2017-03-08: qty 150

## 2017-03-08 MED ORDER — LIDOCAINE HCL 1 % IJ SOLN
INTRAMUSCULAR | Status: AC
  Filled 2017-03-08: qty 20

## 2017-03-08 MED ORDER — EPINEPHRINE PF 1 MG/ML IJ SOLN
0.5000 ug/min | INTRAVENOUS | Status: DC
  Filled 2017-03-08: qty 4

## 2017-03-08 MED FILL — Medication: Qty: 1 | Status: AC

## 2017-03-09 ENCOUNTER — Ambulatory Visit: Payer: 59

## 2017-03-10 ENCOUNTER — Telehealth: Payer: Self-pay

## 2017-03-10 ENCOUNTER — Ambulatory Visit: Payer: 59

## 2017-03-10 ENCOUNTER — Telehealth (HOSPITAL_COMMUNITY): Payer: Self-pay | Admitting: Family Medicine

## 2017-03-10 NOTE — Telephone Encounter (Signed)
On 03/10/17 I received a d/c from [facility] (original). The d/c is for burial. The patient is a patient of Doctor Elsworth Soho. The d/c will be taken to [facility]  for signature.  On 03/11/17 I received the d/c back from Doctor Elsworth Soho. I got the d/c ready and called the funeral home to let them know the d/c is ready for pickup.

## 2017-03-10 NOTE — Telephone Encounter (Signed)
Spoke to patient's mother to convey my condolences Visitation is at [facility] on [address]

## 2017-03-11 ENCOUNTER — Ambulatory Visit: Payer: 59

## 2017-03-11 DIAGNOSIS — C3432 Malignant neoplasm of lower lobe, left bronchus or lung: Secondary | ICD-10-CM | POA: Insufficient documentation

## 2017-03-11 NOTE — Progress Notes (Signed)
  Radiation Oncology         [phone] ________________________________  Name: Crystal Gallagher MRN: [id number]  Date: 03/02/2017  DOB: 23-Feb-1980  SIMULATION AND TREATMENT PLANNING NOTE  DIAGNOSIS:     ICD-10-CM   1. Primary malignant neoplasm of left lower lobe of lung (HCC) C34.32      Site:  chest  NARRATIVE:  The patient was brought to the [facility].  Identity was confirmed.  All relevant records and images related to the planned course of therapy were reviewed.   Written consent to proceed with treatment was confirmed which was freely given after reviewing the details related to the planned course of therapy had been reviewed with the patient.  Then, the patient was set-up in a stable reproducible  supine position for radiation therapy.  CT images were obtained.  Surface markings were placed.     The CT images were loaded into the planning software.  Then the target and avoidance structures were contoured.  Treatment planning then occurred.  The radiation prescription was entered and confirmed.  A total of 4 complex treatment devices were fabricated which relate to the designed radiation treatment fields. Additional reduced fields will be used as necessary to improve the dose homogeneity of the plan. Each of these customized fields/ complex treatment devices will be used on a daily basis during the radiation course. I have requested : 3D Simulation  I have requested a DVH of the following structures: target volume, spinal cord, lungs, heart.   The patient will undergo daily image guidance to ensure accurate localization of the target, and adequate minimize dose to the normal surrounding structures in close proximity to the target.   PLAN:  The patient will receive 30 Gy in 10 fractions.   ________________________________   Jodelle Gross, MD, PhD

## 2017-03-11 NOTE — Progress Notes (Signed)
  Radiation Oncology         [phone] ________________________________  Name: Crystal Gallagher MRN: [id number]  Date: 03/05/2017  DOB: 29-Jul-1980  End of Treatment Note  Diagnosis:   37 y.o. female with stage IV NSCLC, adenocarcinoma of the LLL with obstructing left lower lobe lung mass with complete consolidation of the left lower lobe and at least partially left upper lobe  Indication for treatment::  palliative       Radiation treatment dates:   03/03/2017 - 03/05/2017  Site/planned dose:   Left Lung / 30 Gy in 10 fractions  Narrative: The patient's status declined during her course of radiation, and her radiotherapy was discontinued after 3 treatments.  Plan: The patient died after discontinuing radiation. ________________________________  Jodelle Gross, MD, PhD  This document serves as a record of services personally performed by Kyung Rudd, MD. It was created on his behalf by Rae Lips, a trained medical scribe. The creation of this record is based on the scribe's personal observations and the provider's statements to them. This document has been checked and approved by the attending provider.

## 2017-03-12 ENCOUNTER — Ambulatory Visit: Payer: 59

## 2017-03-12 NOTE — Progress Notes (Signed)
Chaplain providing continued support with mother and extended family as they prepare to leave hospital.    Dirk Dress [place], MDiv

## 2017-03-12 NOTE — Progress Notes (Signed)
   March 29, 2017 1000  Clinical Encounter Type  Visited With Family  Visit Type Death  Referral From Nurse  Consult/Referral To Chaplain  Spiritual Encounters  Spiritual Needs Emotional;Grief support;Other (Comment) (End of Life support)  Stress Factors  Patient Stress Factors Not reviewed  Family Stress Factors Loss   I was paged to the patient's room due to a Code Blue. The patient's mother was at the bedside and very distraught. I provided grief support for the patient's mother and then her sister from the beginning of the Code Blue to after the patient had passed.  The patient's mother was trying to get her son, who is in the service, to be able to come home due to the patient's passing. Information was given about the process moving forward.   Please, contact Spiritual Care for further assistance.  [place] M.Div.

## 2017-03-12 NOTE — Discharge Summary (Signed)
PULMONARY / CRITICAL CARE MEDICINE   Name: Crystal Gallagher MRN: [id number] DOB: 05/08/1980    ADMISSION DATE:  03/10/2017  HISTORY OF PRESENT ILLNESS: 37 yo woman with history of asthma, pneumonia, PE, lifetime nonsmoker, who presented 1/20 for progressively worsening dyspnea & progressive LLL atelectasis requiring mechanical ventilation  She underwent a CT angio of the chest on 12/9 which showed PE predominant on the right side. She was started on anticoagulation and transitioned to xarelto. AC course was complicated by uterine bleeding that resolved. CT imaging from 01/17/17 revealed progression of LLL infiltrate and an apparent cutoff sign.  She was admitted 1/15 for planned bronchoscopy with IV heparin bridge for anticoagulation.  The patient was taken for bronchoscopy on 8/29 which was complicated by persistent coughing and difficult to obtain tissue sampling. An area of erythema, edema was observed in the L lower lobe. A non-obstructing and friable submucosal lesion was identified at the LUL carina that extended into LLL. This endobronchial mass did not obstruct the airway and was biopsied during the procedure. 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  STUDIES:  Bronchoscopy biopsy 1/17 >> Immunohistochemistry shows the tumor is strongly positive for Napsin A and thyroid transcription. factor-1 (TTF-1) and negative with cytokeratin 5/6. The immunophenotype is consistent with primary lung adenocarcinoma. (JDP:ecj 02/25/2017)  CT Angio 1/20 >> 1. Study was limited by considerable patient respiratory motion.With this limitation in mind there is no evidence of central, lobar or segmental sized pulmonary embolism.2. Severe multilobar pneumonia. Notably, the patient has complete left lower lobe consolidation persists since 01/17/2017. This is highly unusual, and concerning for probable central obstruction.  Left lower lobe bronchus  is abruptly cut off on axial image 47 of scan series 5 of today's examination, which could be extrinsic or could be related to an endobronchial lesion. Correlation with bronchoscopy should be considered if not recently performed. New small to moderate parapneumonic pleural effusion, predominantly located sub pulmonic. 3. Interval development of multiple sclerotic osseous lesions. This is highly unusual. The possibility of metastatic disease to the bone should be considered. Bedside US 1/21 >> no fluid   CULTURES: BC x2 1/20 >> ng RVP 1/20 >> negative  BAL 1/21 >> negative  resp 1/25 >>ng  ANTIBIOTICS: Vanc 1/20 >> 1/23 Cefepime 1/20 >>  SIGNIFICANT EVENTS: 1/20  Admit  1/21  Bronchoscopy 1/25  Possible aspiration event with dislodged NGT / TF infusing.  Radiation 1/26 worsening ABG/ lt effusion , changed to propofol gtt  LINES/TUBES: ETT 1/21 >>   COURSE 37 year old woman recently diagnosed with PE on xarelto, with primary lung adenocarcinoma of the lung causing worsening left atelectasis/ post obstructive pneumonia. New sclerotic lesions,left  Effusion worsening & acute resp acidosis.   She was transferred to [facility] and received 3 treatments for radiation after simulation Blood test was sent for EGFR is also a repeat biopsy results, oncology and radiation oncology are following  She developed increasing effusion on the left and Lovenox was changed to IV heparin with intent to pursue thoracentesis. 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 FCCP. [facility] Pager [phone] If no response call [phone]      03/11/2017, 10:17 AM

## 2017-03-12 NOTE — Progress Notes (Signed)
PATIENT NAME: Crystal Gallagher RECORD NUMBER: [id number] Birthday: 24-Jul-1980  Age: 37 y.o. Admit Date: 02/11/2017  Provider: Mariapaula Krist  Indication: PEA   Technical Description:   CPR performance duration: 7 minutes  Was defibrillation or cardioversion used ? no  Was external pacer placed ? no  Was patient intubated pre/post CPR ? Already intubated  Was transvenous pacer placed ? no  Medications Administered Include      Yes/no Amiodarone   Atropin   Calcium   Epinephrine x2  Lidocaine   Magnesium   Norepinephrine   Phenylephrine   Sodium bicarbonate x1  Vasopression    Evaluation  Final Status - Was patient successfully resuscitated ? yes  If successfully resuscitated - what is current rhythm ? ST to NSR If successfully resuscitated - what is current hemodynamic status ? shock  Miscellaneous Information Patient is been progressively hypoxic all morning initially was sinus tachycardia then developed acute bradycardia arrhythmia followed by PEA arrest.  We performed approximately 7 minutes of ACLS this included 2 rounds of epinephrine, manual bag mask ventilation, and bicarbonate push.  We were successful with return of spontaneous circulation initiated bicarbonate and epinephrine infusion.  The patient's mother was in the room, I reviewed the patient's film with her, the progression of her cancer with, and the prognosis.  I informed her we have nothing else to offer.  At this point the plan will be to continue epinephrine and bicarbonate infusion as well as ventilatory support.  I have prepared the mother that there is nothing else for [place] to do in this setting.  I anticipate she will not survive, and have tried to compare the mother that I suspect what time we have left with her will be minutes at best.  I reviewed the case with Dr. Chase Caller my attending MD. he agreed with the current plan of care, nothing else to offer, will continue supportive care as outlined above;  patient now DNR    Erick Colace ACNP-BC [place] Pager # [phone] OR # [phone] if no answer   Clementeen Graham 2019-02-259:50 AM

## 2017-03-12 NOTE — Progress Notes (Signed)
Sent EOT request to L3 per Shona Simpson, PA-C request.

## 2017-03-12 NOTE — Progress Notes (Signed)
[id number] Crystal Gallagher,BSN,RN3,[phone]/remains febrile and on the vent.

## 2017-03-12 NOTE — Progress Notes (Signed)
Patient went into sinus brady arrhythmia into asystole. Compressions started immediately, mother at bedside chaplain assisting mother. Family called to bedside by mother. PCCM NP at bedside. See code blue documentation sheet for further documentation.

## 2017-03-12 NOTE — Progress Notes (Signed)
Time of death 57 Mother in room at bedside.   Erick Colace ACNP-BC [place] Pager # [phone] OR # [phone] if no answer

## 2017-03-12 DEATH — deceased

## 2017-03-15 ENCOUNTER — Ambulatory Visit: Payer: 59

## 2017-03-16 ENCOUNTER — Ambulatory Visit: Payer: 59

## 2017-03-16 ENCOUNTER — Inpatient Hospital Stay: Payer: 59 | Admitting: Emergency Medicine

## 2018-01-26 IMAGING — CT CT ANGIO CHEST
2 of 6 series · 18 of 36 positions shown · IV contrast (iopamidol)
Comparison: Radiography same day.  CT 12/05/2016

CLINICAL DATA: Shortness of breath and chest pain for the last
several months.

EXAM:
CT ANGIOGRAPHY CHEST WITH CONTRAST
TECHNIQUE: Multidetector CT imaging of the chest was performed using the
standard protocol during bolus administration of intravenous
contrast. Multiplanar CT image reconstructions and MIPs were
obtained to evaluate the vascular anatomy.
CONTRAST:  100mL [id number] IOPAMIDOL ([id number]) INJECTION 76%

[Series 7: pe thins · axial · 0.66mm/px · z∈[-105,+126]mm · 17 of 365 slices shown]
[im 17/365  lung]
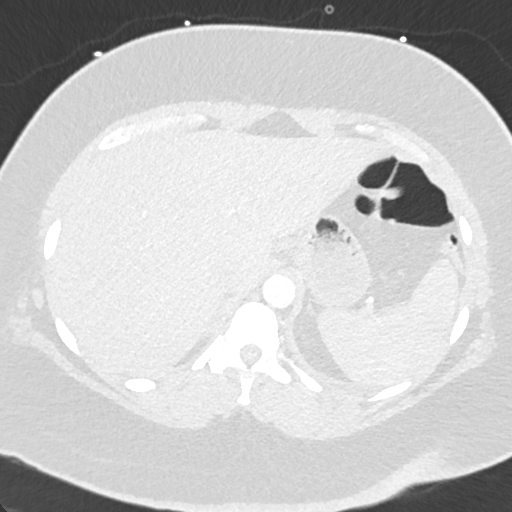
[im 34/365  mediastinal]
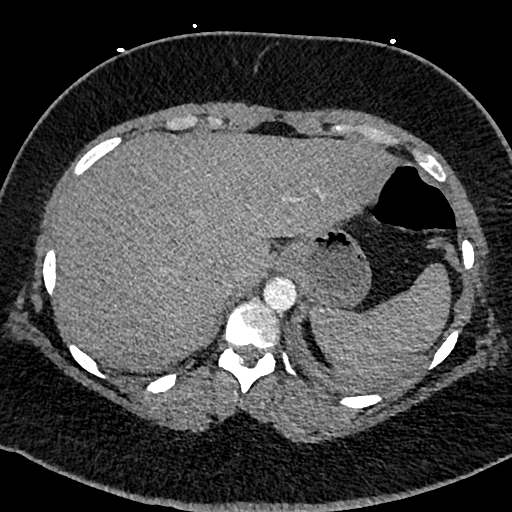
[im 67/365  lung]
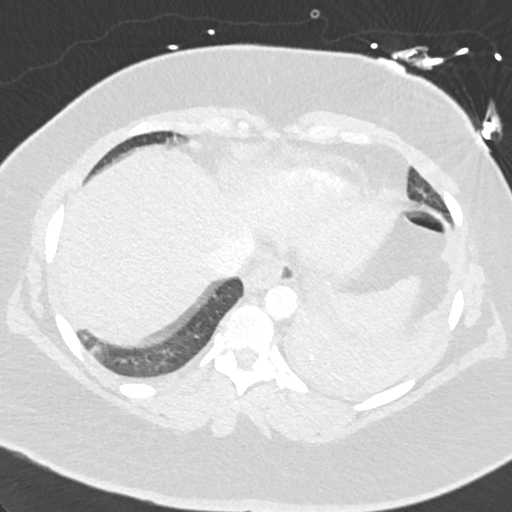
[im 83/365  mediastinal]
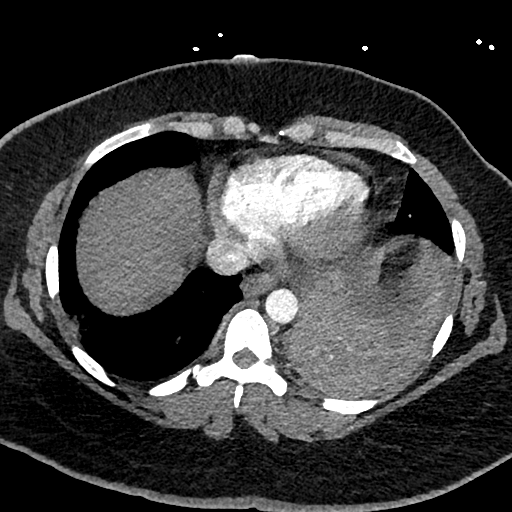
[im 100/365  lung]
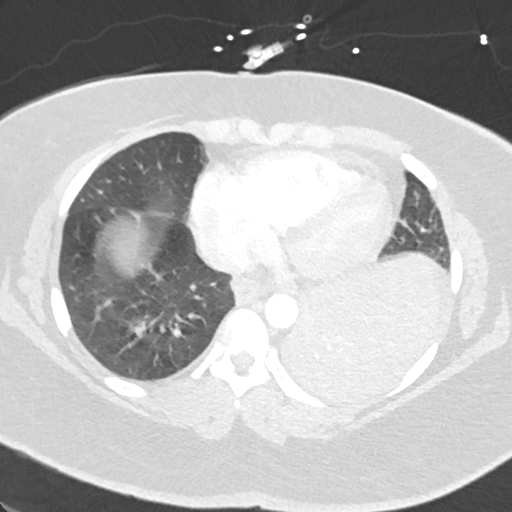
[im 116/365  mediastinal]
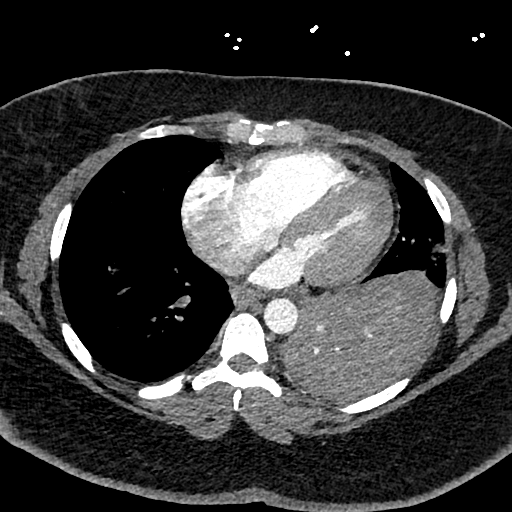
[im 149/365  lung]
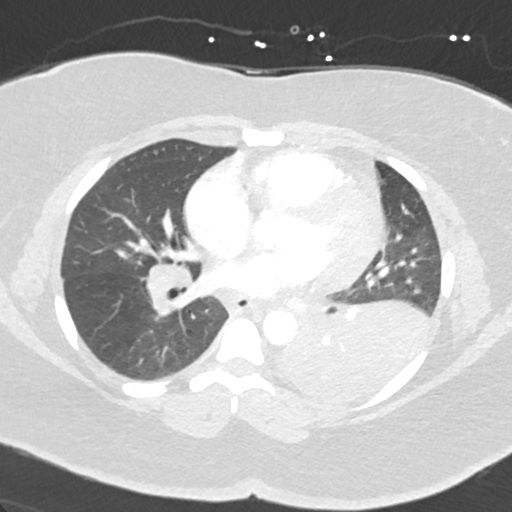
[im 166/365  mediastinal]
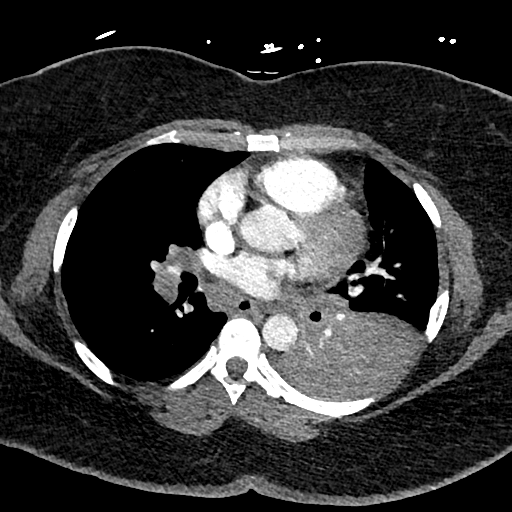
[im 183/365  lung]
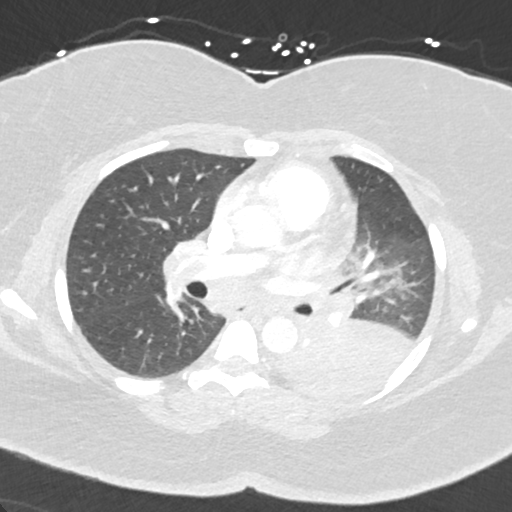
[im 199/365  mediastinal]
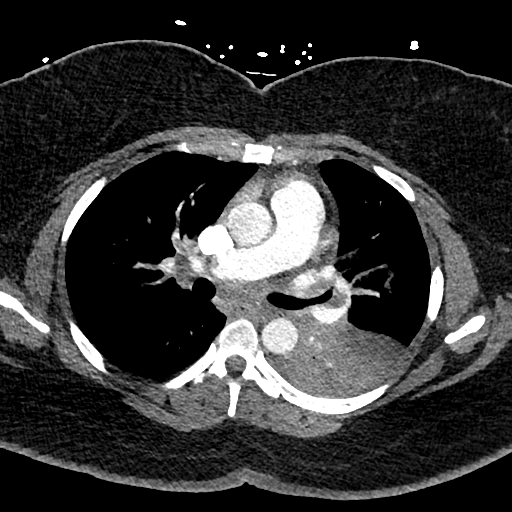
[im 216/365  lung]
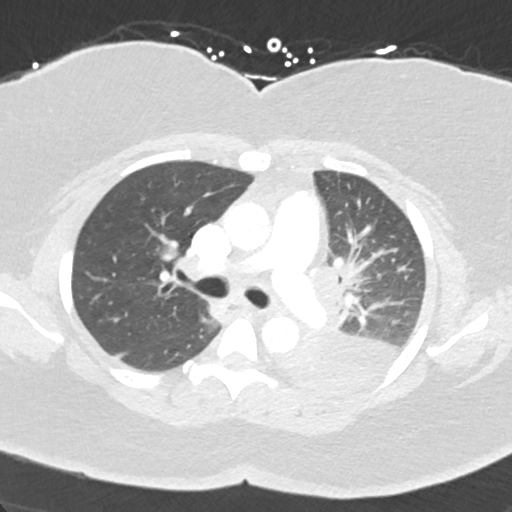
[im 249/365  mediastinal]
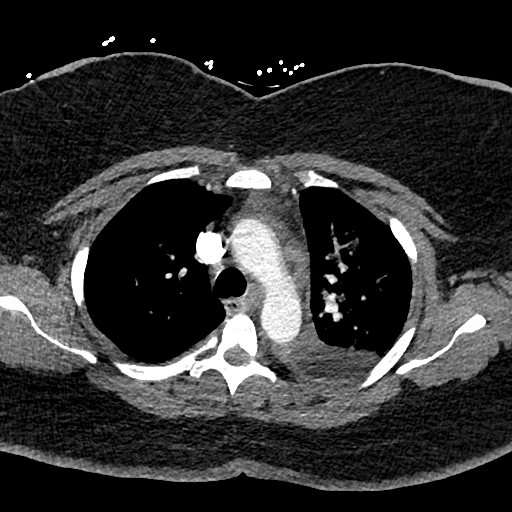
[im 265/365  lung]
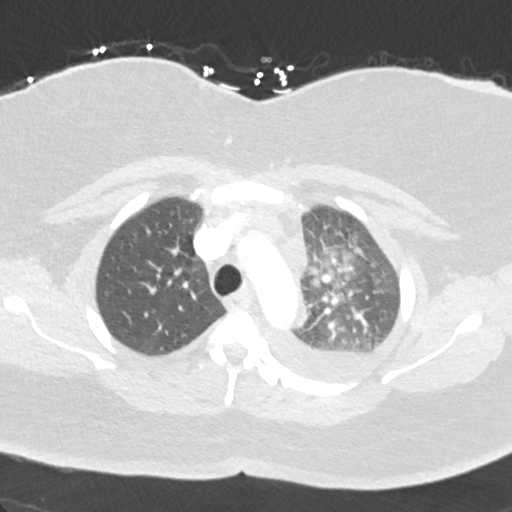
[im 282/365  mediastinal]
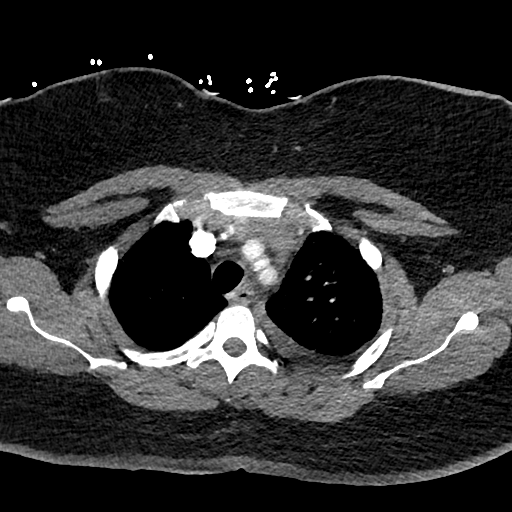
[im 298/365  lung]
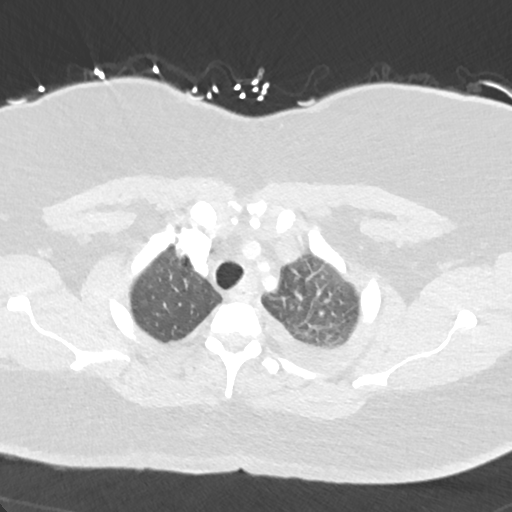
[im 331/365  mediastinal]
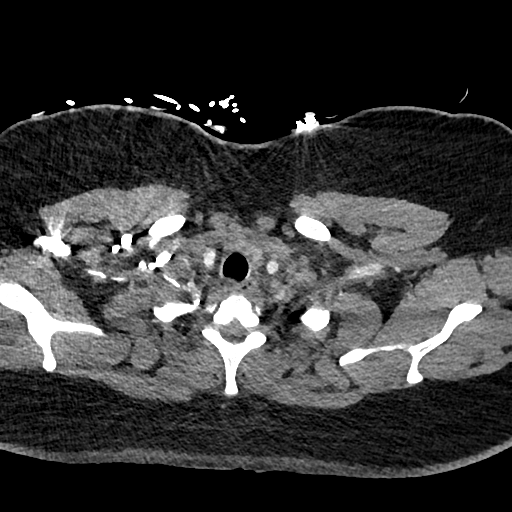
[im 348/365  lung]
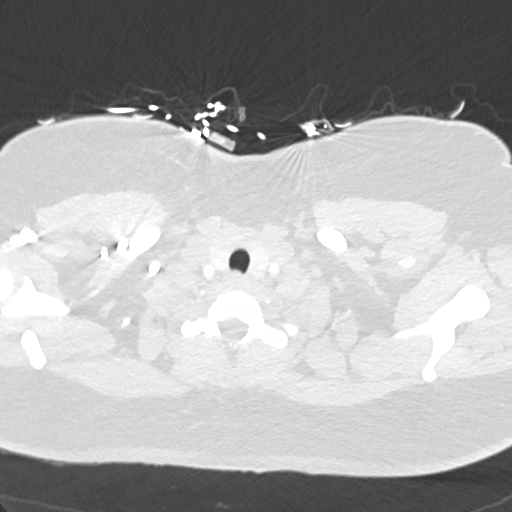

[Series 8: pe 2mm cor · coronal · 0.51mm/px · 1 of 101 slices shown]
[im 51/101  mediastinal]
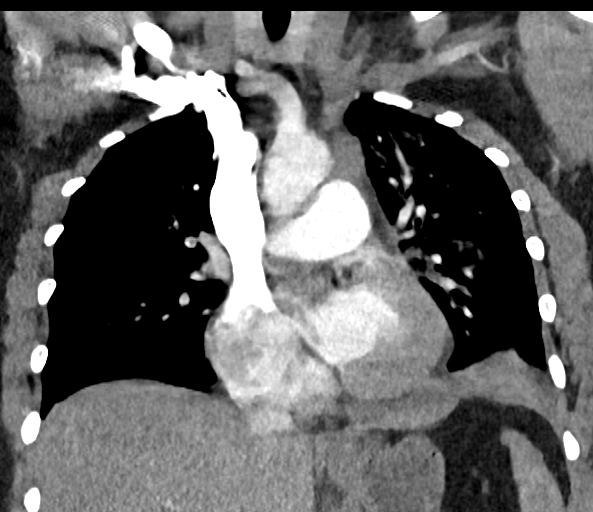

[18 of 36 positions shown; findings below may reference images not displayed]

FINDINGS: Cardiovascular: Pulmonary arterial opacification is good. Extensive
embolic disease is noted within the pulmonary arterial tree on the
right. No visible emboli on the left. No aortic disease. No coronary
artery calcification. Maximum right ventricular diameter is 4.3 cm.
Maximal left ventricular diameter is 2.5 cm. This suggests right
ventricular strain. Additionally, the patient appears to have left
ventricular muscular hypertrophy.

Mediastinum/Nodes: Enlarged subcarinal and bilateral hilar lymph
nodes.

Lungs/Pleura: Right lung is clear. Left lung shows complete
consolidation of the lower lobe with collapse. Areas of patchy
pneumonia present throughout the left upper lobe.

Upper Abdomen: Negative

Musculoskeletal: Negative

Review of the MIP images confirms the above findings.
IMPRESSION: Extensive embolic disease within the right pulmonary arterial tree.
Evidence of right ventricular strain. Positive for acute PE with CT
evidence of right heart strain (RV/LV Ratio = 1.7) consistent with
at least submassive (intermediate risk) PE. The presence of right
heart strain has been associated with an increased risk of morbidity
and mortality. Please activate Code PE by paging [phone].

Worsened consolidation and collapse throughout the left lower lobe.
Patchy pneumonia present throughout the left upper lobe.

Bilateral hilar and subcarinal lymphadenopathy, presumably reactive.
Cannot rule out the possibility of underlying process such as
sarcoid.

Evidence of left ventricular hypertrophy.
